# Patient Record
Sex: Female | Born: 1945
Health system: Southern US, Community
[De-identification: ages and names within clinical notes are randomized; demographics above are authoritative.]

## PROBLEM LIST (undated history)

## (undated) DIAGNOSIS — E785 Hyperlipidemia, unspecified: Secondary | ICD-10-CM

## (undated) DIAGNOSIS — I10 Essential (primary) hypertension: Secondary | ICD-10-CM

## (undated) DIAGNOSIS — F419 Anxiety disorder, unspecified: Secondary | ICD-10-CM

## (undated) DIAGNOSIS — K219 Gastro-esophageal reflux disease without esophagitis: Secondary | ICD-10-CM

## (undated) DIAGNOSIS — Z87891 Personal history of nicotine dependence: Secondary | ICD-10-CM

## (undated) DIAGNOSIS — M1990 Unspecified osteoarthritis, unspecified site: Secondary | ICD-10-CM

## (undated) DIAGNOSIS — IMO0001 Reserved for inherently not codable concepts without codable children: Secondary | ICD-10-CM

## (undated) DIAGNOSIS — K5190 Ulcerative colitis, unspecified, without complications: Secondary | ICD-10-CM

## (undated) DIAGNOSIS — Z8719 Personal history of other diseases of the digestive system: Secondary | ICD-10-CM

## (undated) DIAGNOSIS — H269 Unspecified cataract: Secondary | ICD-10-CM

## (undated) DIAGNOSIS — R011 Cardiac murmur, unspecified: Secondary | ICD-10-CM

## (undated) HISTORY — DX: Unspecified cataract: H26.9

## (undated) HISTORY — DX: Ulcerative colitis, unspecified, without complications: K51.90

## (undated) HISTORY — PX: COLONOSCOPY: SHX174

## (undated) HISTORY — DX: Unspecified osteoarthritis, unspecified site: M19.90

## (undated) HISTORY — DX: Essential (primary) hypertension: I10

## (undated) HISTORY — DX: Personal history of other diseases of the digestive system: Z87.19

## (undated) HISTORY — DX: Cardiac murmur, unspecified: R01.1

## (undated) HISTORY — DX: Hyperlipidemia, unspecified: E78.5

## (undated) HISTORY — DX: Personal history of nicotine dependence: Z87.891

## (undated) HISTORY — DX: Anxiety disorder, unspecified: F41.9

## (undated) HISTORY — DX: Gastro-esophageal reflux disease without esophagitis: K21.9

## (undated) HISTORY — PX: UPPER GASTROINTESTINAL ENDOSCOPY: SHX188

## (undated) HISTORY — DX: Reserved for inherently not codable concepts without codable children: IMO0001

## (undated) HISTORY — PX: KNEE SURGERY: SHX244

---

## 1984-09-15 HISTORY — PX: BREAST BIOPSY: SHX20

## 1998-08-29 ENCOUNTER — Encounter: Payer: Self-pay | Admitting: Obstetrics and Gynecology

## 1998-08-29 ENCOUNTER — Ambulatory Visit: Admission: RE | Admit: 1998-08-29 | Discharge: 1998-08-29 | Payer: Self-pay | Admitting: Obstetrics and Gynecology

## 1998-09-20 ENCOUNTER — Ambulatory Visit (HOSPITAL_COMMUNITY): Admission: RE | Admit: 1998-09-20 | Discharge: 1998-09-20 | Payer: Self-pay | Admitting: Internal Medicine

## 1998-12-31 ENCOUNTER — Other Ambulatory Visit: Admission: RE | Admit: 1998-12-31 | Discharge: 1998-12-31 | Payer: Self-pay | Admitting: Obstetrics and Gynecology

## 1999-09-20 ENCOUNTER — Ambulatory Visit (HOSPITAL_COMMUNITY): Admission: RE | Admit: 1999-09-20 | Discharge: 1999-09-20 | Payer: Self-pay | Admitting: Obstetrics and Gynecology

## 1999-09-20 ENCOUNTER — Encounter: Payer: Self-pay | Admitting: Obstetrics and Gynecology

## 1999-11-07 ENCOUNTER — Encounter (INDEPENDENT_AMBULATORY_CARE_PROVIDER_SITE_OTHER): Payer: Self-pay | Admitting: Specialist

## 1999-11-07 ENCOUNTER — Other Ambulatory Visit: Admission: RE | Admit: 1999-11-07 | Discharge: 1999-11-07 | Payer: Self-pay | Admitting: Internal Medicine

## 2000-01-21 ENCOUNTER — Other Ambulatory Visit: Admission: RE | Admit: 2000-01-21 | Discharge: 2000-01-21 | Payer: Self-pay | Admitting: Obstetrics and Gynecology

## 2000-09-21 ENCOUNTER — Ambulatory Visit (HOSPITAL_COMMUNITY): Admission: RE | Admit: 2000-09-21 | Discharge: 2000-09-21 | Payer: Self-pay | Admitting: Obstetrics and Gynecology

## 2000-09-21 ENCOUNTER — Encounter: Payer: Self-pay | Admitting: Obstetrics and Gynecology

## 2000-09-22 ENCOUNTER — Other Ambulatory Visit: Admission: RE | Admit: 2000-09-22 | Discharge: 2000-09-22 | Payer: Self-pay | Admitting: Obstetrics and Gynecology

## 2003-09-16 DIAGNOSIS — Z8719 Personal history of other diseases of the digestive system: Secondary | ICD-10-CM

## 2003-09-16 HISTORY — DX: Personal history of other diseases of the digestive system: Z87.19

## 2006-04-30 ENCOUNTER — Ambulatory Visit: Payer: Self-pay | Admitting: Internal Medicine

## 2006-09-15 DIAGNOSIS — IMO0001 Reserved for inherently not codable concepts without codable children: Secondary | ICD-10-CM

## 2006-09-15 HISTORY — DX: Reserved for inherently not codable concepts without codable children: IMO0001

## 2006-10-19 ENCOUNTER — Ambulatory Visit: Payer: Self-pay | Admitting: Internal Medicine

## 2006-12-01 ENCOUNTER — Ambulatory Visit: Payer: Self-pay | Admitting: Internal Medicine

## 2006-12-01 LAB — CONVERTED CEMR LAB
ALT: 17 units/L (ref 0–40)
AST: 28 units/L (ref 0–37)
Albumin: 3.8 g/dL (ref 3.5–5.2)
Alkaline Phosphatase: 33 units/L — ABNORMAL LOW (ref 39–117)
BUN: 15 mg/dL (ref 6–23)
Basophils Absolute: 0 10*3/uL (ref 0.0–0.1)
Basophils Relative: 0.7 % (ref 0.0–1.0)
Bilirubin, Direct: 0.1 mg/dL (ref 0.0–0.3)
CO2: 31 meq/L (ref 19–32)
Calcium: 9.3 mg/dL (ref 8.4–10.5)
Chloride: 106 meq/L (ref 96–112)
Cholesterol: 196 mg/dL (ref 0–200)
Creatinine, Ser: 0.8 mg/dL (ref 0.4–1.2)
Eosinophils Absolute: 0.6 10*3/uL (ref 0.0–0.6)
Eosinophils Relative: 8.8 % — ABNORMAL HIGH (ref 0.0–5.0)
GFR calc Af Amer: 94 mL/min
GFR calc non Af Amer: 78 mL/min
Glucose, Bld: 96 mg/dL (ref 70–99)
HCT: 37.1 % (ref 36.0–46.0)
HDL: 54.1 mg/dL (ref >39.0)
Hemoglobin: 13.3 g/dL (ref 12.0–15.0)
LDL Cholesterol: 125 mg/dL — ABNORMAL HIGH (ref 0–99)
Lymphocytes Relative: 31.5 % (ref 12.0–46.0)
MCHC: 36 g/dL (ref 30.0–36.0)
MCV: 91.9 fL (ref 78.0–100.0)
Monocytes Absolute: 0.5 10*3/uL (ref 0.2–0.7)
Monocytes Relative: 7.8 % (ref 3.0–11.0)
Neutro Abs: 3.4 10*3/uL (ref 1.4–7.7)
Neutrophils Relative %: 51.2 % (ref 43.0–77.0)
Platelets: 269 10*3/uL (ref 150–400)
Potassium: 4.8 meq/L (ref 3.5–5.1)
RBC: 4.03 M/uL (ref 3.87–5.11)
RDW: 13 % (ref 11.5–14.6)
Sodium: 141 meq/L (ref 135–145)
TSH: 1.51 microintl units/mL (ref 0.35–5.50)
Total Bilirubin: 0.9 mg/dL (ref 0.3–1.2)
Total CHOL/HDL Ratio: 3.6
Total Protein: 6.6 g/dL (ref 6.0–8.3)
Triglycerides: 84 mg/dL (ref 0–149)
VLDL: 17 mg/dL (ref 0–40)
WBC: 6.6 10*3/uL (ref 4.5–10.5)

## 2006-12-28 ENCOUNTER — Ambulatory Visit: Payer: Self-pay | Admitting: Internal Medicine

## 2007-01-14 ENCOUNTER — Encounter: Payer: Self-pay | Admitting: Internal Medicine

## 2007-01-14 ENCOUNTER — Ambulatory Visit: Payer: Self-pay

## 2007-03-15 ENCOUNTER — Ambulatory Visit: Payer: Self-pay | Admitting: Internal Medicine

## 2007-04-15 DIAGNOSIS — E785 Hyperlipidemia, unspecified: Secondary | ICD-10-CM

## 2007-04-15 DIAGNOSIS — F411 Generalized anxiety disorder: Secondary | ICD-10-CM | POA: Insufficient documentation

## 2007-04-15 DIAGNOSIS — I10 Essential (primary) hypertension: Secondary | ICD-10-CM | POA: Insufficient documentation

## 2007-04-15 DIAGNOSIS — F329 Major depressive disorder, single episode, unspecified: Secondary | ICD-10-CM

## 2007-04-20 ENCOUNTER — Telehealth: Payer: Self-pay | Admitting: Internal Medicine

## 2007-05-24 ENCOUNTER — Telehealth: Payer: Self-pay | Admitting: Internal Medicine

## 2007-08-04 ENCOUNTER — Ambulatory Visit: Payer: Self-pay | Admitting: Internal Medicine

## 2007-10-07 ENCOUNTER — Encounter: Payer: Self-pay | Admitting: Internal Medicine

## 2007-12-02 ENCOUNTER — Telehealth: Payer: Self-pay | Admitting: Internal Medicine

## 2007-12-09 ENCOUNTER — Ambulatory Visit: Payer: Self-pay | Admitting: Internal Medicine

## 2007-12-09 LAB — CONVERTED CEMR LAB
ALT: 22 units/L (ref 0–35)
AST: 23 units/L (ref 0–37)
Albumin: 3.4 g/dL — ABNORMAL LOW (ref 3.5–5.2)
Alkaline Phosphatase: 46 units/L (ref 39–117)
BUN: 18 mg/dL (ref 6–23)
Basophils Absolute: 0.1 10*3/uL (ref 0.0–0.1)
Basophils Relative: 0.8 % (ref 0.0–1.0)
Bilirubin Urine: NEGATIVE
Bilirubin, Direct: 0.1 mg/dL (ref 0.0–0.3)
CO2: 30 meq/L (ref 19–32)
Calcium: 9.2 mg/dL (ref 8.4–10.5)
Chloride: 102 meq/L (ref 96–112)
Cholesterol: 211 mg/dL (ref 0–200)
Creatinine, Ser: 0.6 mg/dL (ref 0.4–1.2)
Direct LDL: 151.8 mg/dL
Eosinophils Absolute: 0.6 10*3/uL (ref 0.0–0.6)
Eosinophils Relative: 7.3 % — ABNORMAL HIGH (ref 0.0–5.0)
GFR calc Af Amer: 131 mL/min
GFR calc non Af Amer: 108 mL/min
Glucose, Bld: 84 mg/dL (ref 70–99)
Glucose, Urine, Semiquant: NEGATIVE
HCT: 41.2 % (ref 36.0–46.0)
HDL: 50.4 mg/dL (ref >39.0)
Hemoglobin: 13.6 g/dL (ref 12.0–15.0)
Ketones, urine, test strip: NEGATIVE
Lymphocytes Relative: 26.2 % (ref 12.0–46.0)
MCHC: 32.9 g/dL (ref 30.0–36.0)
MCV: 96 fL (ref 78.0–100.0)
Monocytes Absolute: 0.7 10*3/uL (ref 0.2–0.7)
Monocytes Relative: 8.2 % (ref 3.0–11.0)
Neutro Abs: 4.9 10*3/uL (ref 1.4–7.7)
Neutrophils Relative %: 57.5 % (ref 43.0–77.0)
Nitrite: NEGATIVE
Platelets: 247 10*3/uL (ref 150–400)
Potassium: 3.9 meq/L (ref 3.5–5.1)
Protein, U semiquant: NEGATIVE
RBC: 4.29 M/uL (ref 3.87–5.11)
RDW: 13.4 % (ref 11.5–14.6)
Sodium: 138 meq/L (ref 135–145)
Specific Gravity, Urine: 1.025
TSH: 1.04 microintl units/mL (ref 0.35–5.50)
Total Bilirubin: 0.8 mg/dL (ref 0.3–1.2)
Total CHOL/HDL Ratio: 4.2
Total Protein: 6.6 g/dL (ref 6.0–8.3)
Triglycerides: 66 mg/dL (ref 0–149)
Urobilinogen, UA: 0.2
VLDL: 13 mg/dL (ref 0–40)
WBC Urine, dipstick: NEGATIVE
WBC: 8.6 10*3/uL (ref 4.5–10.5)
pH: 5.5

## 2008-04-17 ENCOUNTER — Other Ambulatory Visit: Admission: RE | Admit: 2008-04-17 | Discharge: 2008-04-17 | Payer: Self-pay | Admitting: Internal Medicine

## 2008-04-17 ENCOUNTER — Encounter: Payer: Self-pay | Admitting: Internal Medicine

## 2008-04-17 ENCOUNTER — Ambulatory Visit: Payer: Self-pay | Admitting: Internal Medicine

## 2008-04-17 DIAGNOSIS — Z8719 Personal history of other diseases of the digestive system: Secondary | ICD-10-CM

## 2008-08-16 ENCOUNTER — Telehealth: Payer: Self-pay | Admitting: Internal Medicine

## 2008-10-03 ENCOUNTER — Telehealth (INDEPENDENT_AMBULATORY_CARE_PROVIDER_SITE_OTHER): Payer: Self-pay | Admitting: *Deleted

## 2008-11-15 ENCOUNTER — Encounter: Payer: Self-pay | Admitting: Internal Medicine

## 2008-11-22 ENCOUNTER — Encounter: Payer: Self-pay | Admitting: *Deleted

## 2009-03-08 ENCOUNTER — Encounter (INDEPENDENT_AMBULATORY_CARE_PROVIDER_SITE_OTHER): Payer: Self-pay | Admitting: *Deleted

## 2009-07-02 ENCOUNTER — Ambulatory Visit: Payer: Self-pay | Admitting: Internal Medicine

## 2009-07-02 DIAGNOSIS — F17200 Nicotine dependence, unspecified, uncomplicated: Secondary | ICD-10-CM | POA: Insufficient documentation

## 2009-07-02 DIAGNOSIS — R071 Chest pain on breathing: Secondary | ICD-10-CM | POA: Insufficient documentation

## 2009-07-04 LAB — CONVERTED CEMR LAB
ALT: 21 units/L (ref 0–35)
AST: 25 units/L (ref 0–37)
Albumin: 4.4 g/dL (ref 3.5–5.2)
Alkaline Phosphatase: 49 units/L (ref 39–117)
BUN: 15 mg/dL (ref 6–23)
Basophils Absolute: 0.1 10*3/uL (ref 0.0–0.1)
Basophils Relative: 0.9 % (ref 0.0–3.0)
Bilirubin, Direct: 0 mg/dL (ref 0.0–0.3)
CO2: 31 meq/L (ref 19–32)
Calcium: 9.7 mg/dL (ref 8.4–10.5)
Chloride: 101 meq/L (ref 96–112)
Cholesterol: 204 mg/dL — ABNORMAL HIGH (ref 0–200)
Creatinine, Ser: 0.8 mg/dL (ref 0.4–1.2)
Direct LDL: 138.2 mg/dL
Eosinophils Absolute: 0.7 10*3/uL (ref 0.0–0.7)
Eosinophils Relative: 5.8 % — ABNORMAL HIGH (ref 0.0–5.0)
GFR calc non Af Amer: 77.31 mL/min (ref >60)
Glucose, Bld: 76 mg/dL (ref 70–99)
HCT: 43.9 % (ref 36.0–46.0)
HDL: 51.6 mg/dL (ref >39.00)
Hemoglobin: 14.9 g/dL (ref 12.0–15.0)
Lymphocytes Relative: 30.6 % (ref 12.0–46.0)
Lymphs Abs: 3.5 10*3/uL (ref 0.7–4.0)
MCHC: 33.8 g/dL (ref 30.0–36.0)
MCV: 97.7 fL (ref 78.0–100.0)
Monocytes Absolute: 0.9 10*3/uL (ref 0.1–1.0)
Monocytes Relative: 8.1 % (ref 3.0–12.0)
Neutro Abs: 6.4 10*3/uL (ref 1.4–7.7)
Neutrophils Relative %: 54.6 % (ref 43.0–77.0)
Platelets: 252 10*3/uL (ref 150.0–400.0)
Potassium: 5.1 meq/L (ref 3.5–5.1)
RBC: 4.5 M/uL (ref 3.87–5.11)
RDW: 13 % (ref 11.5–14.6)
Sodium: 141 meq/L (ref 135–145)
TSH: 1.12 microintl units/mL (ref 0.35–5.50)
Total Bilirubin: 1 mg/dL (ref 0.3–1.2)
Total CHOL/HDL Ratio: 4
Total Protein: 7.4 g/dL (ref 6.0–8.3)
Triglycerides: 108 mg/dL (ref 0.0–149.0)
VLDL: 21.6 mg/dL (ref 0.0–40.0)
WBC: 11.6 10*3/uL — ABNORMAL HIGH (ref 4.5–10.5)

## 2009-09-17 ENCOUNTER — Encounter (INDEPENDENT_AMBULATORY_CARE_PROVIDER_SITE_OTHER): Payer: Self-pay

## 2009-09-18 ENCOUNTER — Ambulatory Visit: Payer: Self-pay | Admitting: Internal Medicine

## 2009-09-21 ENCOUNTER — Ambulatory Visit: Payer: Self-pay | Admitting: Internal Medicine

## 2009-09-25 ENCOUNTER — Encounter: Payer: Self-pay | Admitting: Internal Medicine

## 2009-10-23 ENCOUNTER — Ambulatory Visit: Payer: Self-pay | Admitting: Internal Medicine

## 2009-10-23 DIAGNOSIS — J029 Acute pharyngitis, unspecified: Secondary | ICD-10-CM

## 2009-10-23 DIAGNOSIS — J069 Acute upper respiratory infection, unspecified: Secondary | ICD-10-CM | POA: Insufficient documentation

## 2009-10-23 LAB — CONVERTED CEMR LAB: Rapid Strep: NEGATIVE

## 2010-03-20 ENCOUNTER — Encounter: Payer: Self-pay | Admitting: Internal Medicine

## 2010-06-22 ENCOUNTER — Emergency Department (HOSPITAL_COMMUNITY): Admission: EM | Admit: 2010-06-22 | Discharge: 2010-06-22 | Payer: Self-pay | Admitting: Emergency Medicine

## 2010-10-17 NOTE — Miscellaneous (Signed)
Summary: Lec previsit  Clinical Lists Changes  Medications: Added new medication of MIRALAX   POWD (POLYETHYLENE GLYCOL 3350) As per prep  instructions. - Signed Added new medication of REGLAN 10 MG  TABS (METOCLOPRAMIDE HCL) As per prep instructions. - Signed Added new medication of DULCOLAX 5 MG  TBEC (BISACODYL) Day before procedure take 2 at 3pm and 2 at 8pm. - Signed Rx of MIRALAX   POWD (POLYETHYLENE GLYCOL 3350) As per prep  instructions.;  #255gm x 0;  Signed;  Entered by: Cornelia Copa RN;  Authorized by: Lafayette Dragon MD;  Method used: Electronically to [facility]  [phone]*, [address] [place], [place]  [address], Ph: [phone] or [phone], Fax: [phone] Rx of REGLAN 10 MG  TABS (METOCLOPRAMIDE HCL) As per prep instructions.;  #2 x 0;  Signed;  Entered by: Cornelia Copa RN;  Authorized by: Lafayette Dragon MD;  Method used: Electronically to [facility]  [phone]*, [address], [place], [place]  [address], Ph: [phone] or [phone], Fax: [phone] Rx of DULCOLAX 5 MG  TBEC (BISACODYL) Day before procedure take 2 at 3pm and 2 at 8pm.;  #4 x 0;  Signed;  Entered by: Cornelia Copa RN;  Authorized by: Lafayette Dragon MD;  Method used: Electronically to [facility]  [phone]*, [address], [place], [place]  [address], Ph: [phone] or [phone], Fax: [phone] Observations: Added new observation of NKA: T (09/18/2009 14:38)    Prescriptions: DULCOLAX 5 MG  TBEC (BISACODYL) Day before procedure take 2 at 3pm and 2 at 8pm.  #4 x 0   Entered by:   Cornelia Copa RN   Authorized by:   Lafayette Dragon MD   Signed by:   Cornelia Copa RN on 09/18/2009   Method used:   Electronically to        [facility]  [phone]* (retail)       [place], [place]  [address]       Ph: [phone] or [phone]       Fax: [phone]   RxID:   [id number] REGLAN 10 MG  TABS (METOCLOPRAMIDE HCL) As per prep instructions.  #2 x 0   Entered by:    Cornelia Copa RN   Authorized by:   Lafayette Dragon MD   Signed by:   Cornelia Copa RN on 09/18/2009   Method used:   Electronically to        [facility]  [phone]* (retail)       [place], [place]  [address]       Ph: [phone] or [phone]       Fax: [phone]   RxID:   [id number] [place]   POWD (POLYETHYLENE GLYCOL 3350) As per prep  instructions.  #255gm x 0   Entered by:   Cornelia Copa RN   Authorized by:   Lafayette Dragon MD   Signed by:   Cornelia Copa RN on 09/18/2009   Method used:   Electronically to        [facility]  [phone]* (retail)       [place], [place]  [address]       Ph: [phone] or [phone]       Fax: [phone]   RxID:   [id number]

## 2010-10-17 NOTE — Letter (Signed)
Summary: Patient Notice- Colon Biospy Results  [facility]  [address] [place], [place] [address]   Phone: [phone]  Fax: [phone]        September 25, 2009 MRN: [id number]    [facility] [place],   [address]    Dear Ms. Bangert,  I am pleased to inform you that the biopsies taken during your recent colonoscopy did not show any evidence of cancer upon pathologic examination.The small polyps were inflammatory polyps from a prior flare up of  Your colitis. Your colitis is not active now.  Additional information/recommendations:  __No further action is needed at this time.  Please follow-up with      your primary care physician for your other healthcare needs.  __Please call [phone] to schedule a return visit to review      your condition.  _x_Continue with the treatment plan as outlined on the day of your      exam.  __You should have a repeat colonoscopy examination for this problem           in _ years.  Please call us if you are having persistent problems or have questions about your condition that have not been fully answered at this time.  Sincerely,  Lafayette Dragon MD   This letter has been electronically signed by your physician.  Appended Document: Patient Notice- Colon Biospy Results Letter mailed 01.12.11

## 2010-10-17 NOTE — Letter (Signed)
Summary: [facility] Instructions  [facility]  [place], [place] [address]   Phone: [phone]  Fax: [phone]       Shelley Wright    06-05-1946    MRN: [id number]       Procedure Day /Date: Friday 09/21/09     Arrival Time: 10:30am     Procedure Time: 11:30 am    Location of Procedure:                    _x _  [facility] (4th Floor)    [place]  Starting 5 days prior to your procedure 09/16/09 do not eat nuts, seeds, popcorn, corn, beans, peas,  salads, or any raw vegetables.  Do not take any fiber supplements (e.g. Metamucil, Citrucel, and Benefiber). ____________________________________________________________________________________________________   THE DAY BEFORE YOUR PROCEDURE         DATE:09/20/09 OMA:YOKHTXHF  1   Drink clear liquids the entire day-NO SOLID FOOD  2   Do not drink anything colored red or purple.  Avoid juices with pulp.  No orange juice.  3   Drink at least 64 oz. (8 glasses) of fluid/clear liquids during the day to prevent dehydration and help the prep work efficiently.  CLEAR LIQUIDS INCLUDE: Water Jello Ice Popsicles Tea (sugar ok, no milk/cream) Powdered fruit flavored drinks Coffee (sugar ok, no milk/cream) Gatorade Juice: apple, white grape, white cranberry  Lemonade Clear bullion, consomm, broth Carbonated beverages (any kind) Strained chicken noodle soup Hard Candy  4   Mix the entire bottle of Miralax with 64 oz. of Gatorade/Powerade in the morning and put in the refrigerator to chill.  5   At 3:00 pm take 2 Dulcolax/Bisacodyl tablets.  6   At 4:30 pm take one Reglan/Metoclopramide tablet.  7  Starting at 5:00 pm drink one 8 oz glass of the Miralax mixture every 15-20 minutes until you have finished drinking the entire 64 oz.  You should finish drinking prep around 7:30 or 8:00 pm.  8   If you are nauseated, you may take the 2nd Reglan/Metoclopramide tablet at  6:30 pm.        9    At 8:00 pm take 2 more DULCOLAX/Bisacodyl tablets.     THE DAY OF YOUR PROCEDURE      DATE: 09/21/09 SFS:ELTRVU  You may drink clear liquids until 9:30 am (2 HOURS BEFORE PROCEDURE).   MEDICATION INSTRUCTIONS  Unless otherwise instructed, you should take regular prescription medications with a small sip of water as early as possible the morning of your procedure.   Additional medication instructions: _  _Do not take fluid pill am of procedure.         OTHER INSTRUCTIONS  You will need a responsible adult at least 63 years of age to accompany you and drive you home.   This person must remain in the waiting room during your procedure.  Wear loose fitting clothing that is easily removed.  Leave jewelry and other valuables at home.  However, you may wish to bring a book to read or an iPod/MP3 player to listen to music as you wait for your procedure to start.  Remove all body piercing jewelry and leave at home.  Total time from sign-in until discharge is approximately 2-3 hours.  You should go home directly after your procedure and rest.  You can resume normal activities the day after your procedure.  The day of your procedure you  should not:   Drive   Make legal decisions   Operate machinery   Drink alcohol   Return to work  You will receive specific instructions about eating, activities and medications before you leave.   The above instructions have been reviewed and explained to me by  Cornelia Copa RN  September 18, 2009 3:06 PM     I fully understand and can verbalize these instructions _____________________________ Date _______

## 2010-10-17 NOTE — Assessment & Plan Note (Signed)
Summary: ST/NJR   Vital Signs:  Patient profile:   65 year old female Menstrual status:  postmenopausal Weight:      146 pounds Temp:     98.5 degrees F oral Pulse rate:   68 / minute BP sitting:   100 / 70  (right arm) Cuff size:   regular  Vitals Entered By: Sherron Monday, CMA (AAMA) (October 23, 2009 2:59 PM) CC: Sore throat, nasal congestion, ha, no coughing or fever. Exposed to strep     Menstrual Status postmenopausal   History of Present Illness: Shelley Wright comes in today for    SDA  above .   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 No discrete exposures.   Onset  4 days ago of st and fever chills and then   congestion and face pressure. taking otc warm liquids and   sudafed.     Preventive Screening-Counseling & Management  Alcohol-Tobacco     Alcohol drinks/day: 0     Smoking Status: quit     Packs/Day: 0.25     Year Quit: 2010  Caffeine-Diet-Exercise     Caffeine use/day: 2     Does Patient Exercise: yes  Current Medications (verified): 1)  Lisinopril-Hydrochlorothiazide 20-12.5 Mg Tabs (Lisinopril-Hydrochlorothiazide) .Marland Kitchen.. 1 By Mouth Once Daily 2)  Lexapro 10 Mg  Tabs (Escitalopram Oxalate) .... Take 1/2 To 1 By Mouth Once Daily. 3)  Crestor 10 Mg  Tabs (Rosuvastatin Calcium) .Marland Kitchen.. 1 By Mouth Once Daily 4)  Chantix Continuing Month Pak 1 Mg Tabs (Varenicline Tartrate) .... Take As Directed 5)  Azulfidine En-Tabs 500 Mg  Tbec (Sulfasalazine) .... Take 2 By Mouth Two Times A Day 6)  Folic Acid 1 Mg Tabs (Folic Acid) .Marland Kitchen.. 1 By Mouth Daily  Allergies (verified): No Known Drug Allergies  Past History:  Past medical, surgical, family and social histories (including risk factors) reviewed for relevance to current acute and chronic problems.  Past Medical History: Reviewed history from 04/17/2008 and no changes required. Hyperlipidemia Hypertension Anxiety Depression Ulcerative colitis pan colitis 2005 g4p4 Myoview  2008 neg    Past Surgical  History: Reviewed history from 04/17/2008 and no changes required. breast bx 1986  Past History:  Care Management: Gastroenterology:[facility]; [facility]   Family History: Reviewed history from 04/17/2008 and no changes required. breast cancer MOM age 70 mom  angina   3 sibs   one DM early   father died of panceatic cancer   10 years ago.   Family History Hypertension every one  No colon cancer    Social History: Reviewed history from 07/02/2009 and no changes required. Former Smoker still struggling but ok Alcohol use-yes Drug use-no Regular exercise-no Occupation: Pharmacist, hospital HHof 2  pet dog  Caffeine use/day:  2 Does Patient Exercise:  yes  Review of Systems  The patient denies weight gain, vision loss, decreased hearing, chest pain, peripheral edema, prolonged cough, headaches, hemoptysis, and abdominal pain.    Physical Exam  General:  Well-developed,well-nourished,in no acute distress; alert,appropriate and cooperative throughout examination Head:  Normocephalic and atraumatic without obvious abnormalities. No apparent alopecia or balding. Eyes:  clear   no discharge  Ears:  R ear normal, L ear normal, and no external deformities.   Nose:  no external deformity and no external erythema.  very congested face minimally tender  Mouth:  uvula mildy red no lesions nor ulcer s Neck:  No deformities, masses, or tenderness noted. shoddy ac nodes  Lungs:  normal respiratory effort, no intercostal retractions, and no accessory muscle use.   Heart:  normal rate, regular rhythm, and no murmur.   Pulses:  nl cap refill  Neurologic:  non focal  Skin:  turgor normal, color normal, no ecchymoses, and no petechiae.   Cervical Nodes:  shoddy nodes  Psych:  Oriented X3, good eye contact, not anxious appearing, and not depressed appearing.     Impression & Recommendations:  Problem # 1:  SORE THROAT (ICD-462) appears to be viral   by hx and context .  Expectant management     to check for alarm signs    Orders: Rapid Strep (79728)  Problem # 2:  URI (ASU-015.9) viral  signs treatment.   Complete Medication List: 1)  Lisinopril-hydrochlorothiazide 20-12.5 Mg Tabs (Lisinopril-hydrochlorothiazide) .Marland Kitchen.. 1 by mouth once daily 2)  Lexapro 10 Mg Tabs (Escitalopram oxalate) .... Take 1/2 to 1 by mouth once daily. 3)  Crestor 10 Mg Tabs (Rosuvastatin calcium) .Marland Kitchen.. 1 by mouth once daily 4)  Chantix Continuing Month Pak 1 Mg Tabs (Varenicline tartrate) .... Take as directed 5)  Azulfidine En-tabs 500 Mg Tbec (Sulfasalazine) .... Take 2 by mouth two times a day 6)  Folic Acid 1 Mg Tabs (Folic acid) .Marland Kitchen.. 1 by mouth daily  Patient Instructions: 1)  Get plenty of rest, drink lots of clear liquids, and use Tylenol or Ibuprofen for fever and comfort. Return in 7-10 days if you're not better: sooner if you'er feeling worse.  2)  Acute sinusitis symptoms for less than 10 days are not helped by antibiotics. Use warm moist compresses, and over the counter decongestants( only as directed). Call if no improvement in 5-7 days, sooner if increasing pain, fever, or new symptoms.   Laboratory Results    Other Tests  Rapid Strep: negative Comments: Joyce Gross  October 23, 2009 3:29 PM   Kit Test Internal QC: Negative   (Normal Range: Negative)

## 2010-10-17 NOTE — Miscellaneous (Signed)
Summary: GI MED  Clinical Lists Changes  Medications: Added new medication of AZULFIDINE EN-TABS 500 MG  TBEC (SULFASALAZINE) take 2 by mouth two times a day - Signed Added new medication of FOLIC ACID 1 MG TABS (FOLIC ACID) 1 by mouth daily - Signed Rx of AZULFIDINE EN-TABS 500 MG  TBEC (SULFASALAZINE) take 2 by mouth two times a day;  #120 x 6;  Signed;  Entered by: Trilby Leaver RN;  Authorized by: Lafayette Dragon MD;  Method used: Electronically to [facility]  [phone]*, [address] [place], [place]  [address], Ph: [phone] or [phone], Fax: [phone] Rx of FOLIC ACID 1 MG TABS (FOLIC ACID) 1 by mouth daily;  #100 x 3;  Signed;  Entered by: Trilby Leaver RN;  Authorized by: Lafayette Dragon MD;  Method used: Electronically to [facility]  [phone]*, [address], [place], [place]  [address], Ph: [phone] or [phone], Fax: [phone] Observations: Added new observation of ALLERGY REV: Done (09/21/2009 12:53) Added new observation of NKA: T (09/21/2009 12:53)    Prescriptions: FOLIC ACID 1 MG TABS (FOLIC ACID) 1 by mouth daily  #100 x 3   Entered by:   Trilby Leaver RN   Authorized by:   Lafayette Dragon MD   Signed by:   Trilby Leaver RN on 09/21/2009   Method used:   Electronically to        [facility]  [phone]* (retail)       [place], [place]  [address]       Ph: [phone] or [phone]       Fax: [phone]   RxID:   [id number] AZULFIDINE EN-TABS 500 MG  TBEC (SULFASALAZINE) take 2 by mouth two times a day  #120 x 6   Entered by:   Trilby Leaver RN   Authorized by:   Lafayette Dragon MD   Signed by:   Trilby Leaver RN on 09/21/2009   Method used:   Electronically to        [facility]  [phone]* (retail)       [address] [place], [place]  [address]       Ph: [phone] or [phone]       Fax: [phone]   RxID:   [id number]

## 2010-10-17 NOTE — Procedures (Signed)
Summary: Colonoscopy  Patient: [name] Note: All result statuses are Final unless otherwise noted.  Tests: (1) Colonoscopy (COL)   COL Colonoscopy           [facility].     [place], [place]  [address]           COLONOSCOPY PROCEDURE REPORT           PATIENT:  Shelley, Wright  MR#:  [id number]     BIRTHDATE:  1945/12/09, 63 yrs. old  GENDER:  female           ENDOSCOPIST:  Lowella Bandy. Olevia Perches, MD     Referred by:  Standley Brooking. Panosh, M.D.           PROCEDURE DATE:  09/21/2009     PROCEDURE:  Colonoscopy 09983     ASA CLASS:  Class I     INDICATIONS:  Ulcerative colitis on colon 2001, flare up in     [place] 2007-had pancolitis, now in remissiopn on no medications (     Asacal "too expensive")           MEDICATIONS:   Versed 10 mg, Fentanyl 75 mcg           DESCRIPTION OF PROCEDURE:   After the risks benefits and     alternatives of the procedure were thoroughly explained, informed     consent was obtained.  Digital rectal exam was performed and     revealed no rectal masses.   The LB [id number] endoscope     was introduced through the anus and advanced to the terminal ileum     which was intubated for a short distance, without limitations.     The quality of the prep was good, using MiraLax.  The instrument     was then slowly withdrawn as the colon was fully examined.     <<PROCEDUREIMAGES>>           FINDINGS:  pseudopolyps throughout the colon. multiple 5-10 mm     pseudopolyps, no evidence os acute colitis Multiple biopsies were     obtained and sent to pathology (see image2, image3, image6,     image11, and image12).  Mild diverticulosis was found (see     image1).  The terminal ileum appeared normal. normal appering TI     With standard forceps, biopsy was obtained and sent to pathology     (see image7, image9, and image8).  This was otherwise a normal     examination of the colon (see image13, image11, image10, and     image6).    Retroflexed views in the rectum revealed no     abnormalities.    The scope was then withdrawn from the patient     and the procedure completed.           COMPLICATIONS:  None           ENDOSCOPIC IMPRESSION:     1) Pseudopolyps throughout the colon     2) Mild diverticulosis     3) Normal terminal ileum     4) Otherwise normal examination     no evidence of active IBD     RECOMMENDATIONS:     Azulfidine 551m, #120, 2 po bid, 6 refills, Folic acid 179m     #1#3821 po qd           REPEAT EXAM:  In  5 year(s) for.           ______________________________     Lowella Bandy. Olevia Perches, MD           CC:           n.     eSIGNED:   Lowella Bandy. Tsering Leaman at 09/21/2009 12:36 PM           Page 2 of 3   Shelley, Wright, [id number]  Note: An exclamation mark (!) indicates a result that was not dispersed into the flowsheet. Document Creation Date: 09/21/2009 12:36 PM _______________________________________________________________________  (1) Order result status: Final Collection or observation date-time: 09/21/2009 12:22 Requested date-time:  Receipt date-time:  Reported date-time:  Referring Physician:   Ordering Physician: Delfin Edis [phone]) Specimen Source:  Source: Tawanna Cooler Order Number: [id number] Lab site:   Appended Document: Colonoscopy     Procedures Next Due Date:    Colonoscopy: 09/2014

## 2010-11-17 ENCOUNTER — Other Ambulatory Visit: Payer: Self-pay | Admitting: Internal Medicine

## 2010-11-18 NOTE — Telephone Encounter (Signed)
Mailed letter to pt saying that she needs a follow up appt before next refill.

## 2010-12-23 ENCOUNTER — Other Ambulatory Visit: Payer: Self-pay | Admitting: Internal Medicine

## 2010-12-23 MED ORDER — LISINOPRIL-HYDROCHLOROTHIAZIDE 20-12.5 MG PO TABS: 1.0000 | ORAL_TABLET | Freq: Every day | ORAL | Status: DC

## 2011-01-27 ENCOUNTER — Other Ambulatory Visit: Payer: Self-pay | Admitting: Internal Medicine

## 2011-01-31 NOTE — Assessment & Plan Note (Signed)
[facility] OFFICE NOTE   Shelley Wright, Shelley Wright                       MRN:          [id number]  DATE:10/19/2006                            DOB:          Feb 17, 1946    CHIEF COMPLAINT:  New patient to get established.  Question change to  generic meds.   HISTORY OF PRESENT ILLNESS:  Shelley Wright is a 65 year old ex-smoking  times 1-1/2 years, white female who comes in today for a first time  visit.  Her previous care was in [place] and before that in [place].  She is trying to establish with a primary care physician.  She sees Dr. Olevia Wright for gastroenterologic needs.  She is generally well  today, at least concerned about weight gain over the last number of  years, at least 10 pounds, possibly associated with change of jobs and  going back to teaching preschoolers. She was put on Lexapro over the  last 6 to 8 months for some mild mood issues, which the medicine has  significantly helped.  She would like to remain on this.  Because her  husband lost his job, she is well aware of financial constraints and  wondered if some of her medicines can be changed to generic or  equivalent.  She currently has hypertension on medication  Benicar/hydrochlorothiazide.  Hyperlipidemia recently on Crestor over  the last number of months and Lexapro as above.  Currently she is due  for a Pap.  The last one was 2 years ago.  There is some question about  shortness of breath under certain times but no specific chest pain or  major change in exercise tolerance.   PAST MEDICAL HISTORY:  See database.  Breast biopsy in 1970 and 1986.  She is gravida 4, para 4, last Pap was 2 years ago.  Last mammogram was  January 2008.  She is diagnosed with ulcerative colitis.  Had been on  Asacol but stopped it because of side effects for the last 6 months.  Situational depression/anxiety on Lexapro.  Hypertension,  hyperlipidemia.   FAMILY  HISTORY:  Father died of pancreatic CA as well as maternal  grandparent. Mom has angina.  Three sisters are alive and well but many  of them or on anti-hypertensive medications.  There is a grandson on  thyroid medication.  Negative for diabetes or osteoporosis or stroke.   SOCIAL HISTORY:  Teaches preschool.  Social alcohol.  Smoked on a daily  bases up until about 1-1/2 years ago.  Currently no regular exercise.  Household of just 1.   REVIEW OF SYSTEMS:  As per HPI.  GI negative, GU negative at present.   MEDICATIONS:  1. Lexapro 10 mg.  2. Crestor 10 mg.  3. Benicar 20/12.5 hydrochlorothiazide daily.   DRUG ALLERGIES:  None recorded.   OBJECTIVE:  Height 5 feet 2-1/4 inches.  Weight 152, pulse 72 regular.  Blood pressure 100/60.  WDWN, healthy appearing lady in no acute  distress.  HEENT:  Is grossly normal.  NECK:  Supple without mass, thyromegaly or bruit.  CHEST: CTAP.  Equal.  CARDIAC:  S1, S2 __________ murmurs.  Peripheral pulses present __________  ABDOMEN:  Soft, no organomegaly, guarding or rebound.  NEUROLOGIC:  Appears grossly intact.   IMPRESSION:  1. Hypertension.  2. Hyperlipidemia.  3. Mood with anxiety - stable, on medications.  4. Concern about weight gain.  5. Ulcerative colitis, quiescent.  6. Concerns about cost.   PLAN:  Get copy of her most recent EKG, can try  lisinopril/hydrochlorothiazide 20/12/5 instead of  Benicar/hydrochlorothiazide.  She may try the simvastatin 40 mg 1 p.o.  q. day, although this is not equivalent in potency as 10 mg of Crestor.  We will follow up her lipids in 6 to 8 weeks to assess response and she  will then come back for an ROV to review results and to do a well woman  exam.  At that time we will assess for other health care needs.     Shelley Wright. Panosh, MD  Electronically Signed    WKP/MedQ  DD: 10/19/2006  DT: 10/20/2006  Job #: [id number]

## 2011-01-31 NOTE — Assessment & Plan Note (Signed)
[facility] OFFICE NOTE   Shelley Wright, Shelley Wright                       MRN:          [id number]  DATE:04/30/2006                            DOB:          March 27, 1946    Shelley Wright is a very nice 65 year old white female, a patient of mine, with  ulcerative colitis.  She has moved away to [place], [place], for the past 5  years, then has moved back 6 months ago.  Her last appointment with [place] was  in February of 2001.  She had a full colonoscopy on November 07, 1999, which  showed acute proctitis.  In [place] the patient developed acute flare-up of  the colitis in 2005 while being stressed out about the wedding of her son.  She developed pancolitis.  She had a flexible sigmoidoscopy and CT scan of  the abdomen, and was almost hospitalized but eventually was treated as an  outpatient with steroids for several months, 4.8 grams of Asacol.  She lost  about 20 pounds, which she eventually gained back.  She slowly tapered off  her Asacol when she moved to [place] 6 months ago, and has been on no  medication for the past 3 months.  She is in remission, having bowel  movements every 3 days, no bleeding, mucus, abdominal pain.  She has no  other complaints.   MEDICATIONS:  1. Benicar 20 mg p.o. daily.  2. Crestor 5 mg p.o. daily.   PAST MEDICAL HISTORY:  Significant for high blood pressure and high  cholesterol.   FAMILY HISTORY:  Negative for colon cancer or inflammatory bowel disease.   SOCIAL HISTORY:  She is married with 4 children.  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   REVIEW OF SYSTEMS:  Positive for back pain.   PHYSICAL EXAMINATION:  VITAL SIGNS:  Blood pressure 110/66, pulse 88, weight  146 pounds.  GENERAL:  She was alert, oriented, in no distress.  HEENT:  Sclerae is nonicteric.  LUNGS:  Clear to auscultation.  CARDIAC:  Cor with normal S1, normal S2.  ABDOMEN:  Soft,  nontender with normoactive bowel sounds.  Liver edge at the  costal margin.  RECTAL:  Hemoccult negative.  Formed stool is in the ampulla.  EXTREMITIES:  No edema.   IMPRESSION:  A 65 year old white female with ulcerative colitis.   INITIAL DIAGNOSIS:  Proctitis in 2001, subsequently progressed to pancolitis  in 2005.  Last colonoscopy 2 years ago.   PLAN:  1. The patient needs to be on a maintenance dose of Asacol.  I suggest 6 a      day.  If she has a flare-up, she needs to go immediately up to 4.8      grams a day.  I have given her a prescription.  2. Currently she can stay on a high fiber or regular diet.  3. I need to see her at least on a yearly basis, but suggest repeat      colonoscopy in 5 years from the last colonoscopy, which was done 2  years ago, which would be in August 2010, or earlier if she develops a      flare-up.  I told her to call us immediately if she develops a flare-      up.  4. Referral to an internist.  We will refer her to Dr. Shanon Ace at      [facility] for control of her blood pressure and general medical care.                                   Shelley Wright. Olevia Perches, MD   DMB/MedQ  DD:  04/30/2006  DT:  05/01/2006  Job #:  [id number]   cc:   Standley Brooking. Regis Bill, MD

## 2011-02-27 ENCOUNTER — Other Ambulatory Visit: Payer: Self-pay | Admitting: Internal Medicine

## 2011-03-05 ENCOUNTER — Other Ambulatory Visit (INDEPENDENT_AMBULATORY_CARE_PROVIDER_SITE_OTHER): Payer: BC Managed Care – HMO

## 2011-03-05 DIAGNOSIS — Z0000 Encounter for general adult medical examination without abnormal findings: Secondary | ICD-10-CM

## 2011-03-05 LAB — TSH: TSH: 1.24 u[IU]/mL (ref 0.35–5.50)

## 2011-03-05 LAB — BASIC METABOLIC PANEL
BUN: 22 mg/dL (ref 6–23)
Calcium: 9.6 mg/dL (ref 8.4–10.5)
GFR: 132.27 mL/min (ref >60.00)
Potassium: 4.1 mEq/L (ref 3.5–5.1)
Sodium: 139 mEq/L (ref 135–145)

## 2011-03-05 LAB — POCT URINALYSIS DIPSTICK
Glucose, UA: NEGATIVE
Nitrite, UA: NEGATIVE
Protein, UA: NEGATIVE
Urobilinogen, UA: 0.2

## 2011-03-05 LAB — CBC WITH DIFFERENTIAL/PLATELET
Eosinophils Relative: 6.6 % — ABNORMAL HIGH (ref 0.0–5.0)
HCT: 40.2 % (ref 36.0–46.0)
Lymphocytes Relative: 23.3 % (ref 12.0–46.0)
Lymphs Abs: 2.4 10*3/uL (ref 0.7–4.0)
Monocytes Relative: 5.7 % (ref 3.0–12.0)
Platelets: 247 10*3/uL (ref 150.0–400.0)
WBC: 10.1 10*3/uL (ref 4.5–10.5)

## 2011-03-05 LAB — HEPATIC FUNCTION PANEL
Albumin: 4.5 g/dL (ref 3.5–5.2)
Alkaline Phosphatase: 46 U/L (ref 39–117)
Total Protein: 6.9 g/dL (ref 6.0–8.3)

## 2011-03-05 LAB — LIPID PANEL
Cholesterol: 217 mg/dL — ABNORMAL HIGH (ref 0–200)
HDL: 55.3 mg/dL (ref >39.00)
Total CHOL/HDL Ratio: 4
Triglycerides: 155 mg/dL — ABNORMAL HIGH (ref 0.0–149.0)
VLDL: 31 mg/dL (ref 0.0–40.0)

## 2011-03-05 LAB — LDL CHOLESTEROL, DIRECT: Direct LDL: 135.2 mg/dL

## 2011-03-26 ENCOUNTER — Other Ambulatory Visit: Payer: Self-pay

## 2011-03-27 ENCOUNTER — Encounter: Payer: Self-pay | Admitting: Internal Medicine

## 2011-04-02 ENCOUNTER — Encounter: Payer: Self-pay | Admitting: Internal Medicine

## 2011-04-02 ENCOUNTER — Other Ambulatory Visit (HOSPITAL_COMMUNITY)
Admission: RE | Admit: 2011-04-02 | Discharge: 2011-04-02 | Disposition: A | Discharge status: Home / Self Care | Payer: BC Managed Care – HMO | Source: Ambulatory Visit | Attending: Internal Medicine | Admitting: Internal Medicine

## 2011-04-02 ENCOUNTER — Ambulatory Visit (INDEPENDENT_AMBULATORY_CARE_PROVIDER_SITE_OTHER): Payer: BC Managed Care – HMO | Admitting: Internal Medicine

## 2011-04-02 VITALS — BP 110/80 | HR 66 | Ht 62.0 in | Wt 156.0 lb

## 2011-04-02 DIAGNOSIS — I10 Essential (primary) hypertension: Secondary | ICD-10-CM

## 2011-04-02 DIAGNOSIS — M791 Myalgia, unspecified site: Secondary | ICD-10-CM

## 2011-04-02 DIAGNOSIS — E785 Hyperlipidemia, unspecified: Secondary | ICD-10-CM

## 2011-04-02 DIAGNOSIS — Z0000 Encounter for general adult medical examination without abnormal findings: Secondary | ICD-10-CM

## 2011-04-02 DIAGNOSIS — Z136 Encounter for screening for cardiovascular disorders: Secondary | ICD-10-CM

## 2011-04-02 DIAGNOSIS — Z01419 Encounter for gynecological examination (general) (routine) without abnormal findings: Secondary | ICD-10-CM | POA: Insufficient documentation

## 2011-04-02 DIAGNOSIS — Z8719 Personal history of other diseases of the digestive system: Secondary | ICD-10-CM

## 2011-04-02 NOTE — Patient Instructions (Signed)
Stop the crestor for about 3 weeks or so and see if  Body aches get better . If they do then restart and if comes back then call  About advice. We can change meds if needed then.  Make sure getting vit d [phone] iu in diet or supplement sometimes deficicncy willl add to body aches on lipid meds .

## 2011-04-02 NOTE — Progress Notes (Signed)
  Subjective:    Patient ID: Shelley Wright, female    DOB: May 27, 1946, 65 y.o.   MRN: [id number]  HPI Patient comes in today for preventive visit and follow-up of medical issues. Update of her history since her last visit. Has some achiness and usnsure what this is . NO injury HT on meds no se LIPIDS  On  crestor no se in the past Tobacco    Stopped off and on  Used chantix Ansiety not using meds doing ok   Review of Systems ROS:  GEN/ HEENTNo fever, significant weight changes sweats headaches vision problems hearing changes, CV/ PULM; No chest pain shortness of breath cough, syncope,edema  change in exercise tolerance. GI /GU: No adominal pain, vomiting, change in bowel habits. No blood in the stool. No significant GU symptoms. SKIN/HEME: ,no acute skin rashes suspicious lesions or bleeding. No lymphadenopathy, nodules, masses.  NEURO/ PSYCH:  No neurologic signs such as weakness numbness No depression anxiety. IMM/ Allergy: No unusual infections.  Allergy .    Hx of arthtiris  Am stiffness  REST of 12 system review negative or as  Per hpi Past history family history social history reviewed in the electronic medical record.     Objective:   Physical Exam Physical Exam: Vital signs reviewed IBB:CWUG is a well-developed well-nourished alert cooperative  female who appears her stated age in no acute distress.  HEENT: normocephalic  traumatic , Eyes: PERRL EOM's full, conjunctiva clear, Nares: paten,t no deformity discharge or tenderness., Ears: no deformity EAC's clear TMs with normal landmarks. Mouth: clear OP, no lesions, edema.  Moist mucous membranes. Dentition in adequate repair. NECK: supple without masses, thyromegaly or bruits. CHEST/PULM:  Clear to auscultation and percussion breath sounds equal no wheeze , rales or rhonchi. No chest wall deformities or tenderness. CV: PMI is nondisplaced, S1 S2 no gallops, murmurs, rubs. Peripheral pulses are full without delay.No JVD .    Breast: normal by inspection . No dimpling, discharge, masses, tenderness or discharge . LN: no cervical axillary inguinal adenopathy  ABDOMEN: Bowel sounds normal nontender  No guard or rebound, no hepato splenomegal no CVA tenderness.  No hernia. Extremtities:  No clubbing cyanosis or edema, no acute joint swelling or redness no focal atrophy NEURO:  Oriented x3, cranial nerves 3-12 appear to be intact, no obvious focal weakness,gait within normal limits no abnormal reflexes or asymmetrical SKIN: No acute rashes normal turgor, color, no bruising or petechiae. PSYCH: Oriented, good eye contact, no obvious depression anxiety, cognition and judgment appear normal. Pelvic: NL ext GU, labia clear without lesions or rash . Vagina no lesions .Cervix: clear  UTERUS: Neg CMT Adnexa:  clear no masses . PAP done    EKG  nsr but ? r in v2   Rest or anterior forces are normal none to compare but this may be when had  Nl myoview a hew years ago Labs reviewed with patient.  Assessment & Plan:  Preventive Health Care Counseled regarding healthy nutrition, exercise, sleep, injury prevention, calcium vit d and healthy weight . No tobacco Achiness non descript   Always could be meds   Not an arthritis pain.  Will trial off an on Ht controlled no change Hx of UC quiescent.

## 2011-04-03 ENCOUNTER — Encounter: Payer: Self-pay | Admitting: Internal Medicine

## 2011-04-04 ENCOUNTER — Other Ambulatory Visit: Payer: Self-pay | Admitting: Internal Medicine

## 2011-04-05 ENCOUNTER — Encounter: Payer: Self-pay | Admitting: Internal Medicine

## 2011-04-05 DIAGNOSIS — Z8719 Personal history of other diseases of the digestive system: Secondary | ICD-10-CM | POA: Insufficient documentation

## 2011-04-05 DIAGNOSIS — M791 Myalgia, unspecified site: Secondary | ICD-10-CM | POA: Insufficient documentation

## 2011-04-07 ENCOUNTER — Encounter: Payer: Self-pay | Admitting: *Deleted

## 2011-04-25 ENCOUNTER — Other Ambulatory Visit: Payer: Self-pay | Admitting: Internal Medicine

## 2011-06-12 ENCOUNTER — Telehealth: Payer: Self-pay | Admitting: *Deleted

## 2011-06-12 NOTE — Telephone Encounter (Signed)
Agree with advice

## 2011-06-12 NOTE — Telephone Encounter (Signed)
Pt was bitten by a red ant 2 days ago and hand is extremely swollen but not painful.  Asked what she can do tonight as she was given appt to see Dr. Elease Hashimoto tomorrow.  Suggested Benadryl tonight.

## 2011-06-13 ENCOUNTER — Ambulatory Visit (INDEPENDENT_AMBULATORY_CARE_PROVIDER_SITE_OTHER): Payer: BC Managed Care – HMO | Admitting: Family Medicine

## 2011-06-13 ENCOUNTER — Encounter: Payer: Self-pay | Admitting: Family Medicine

## 2011-06-13 VITALS — BP 120/80 | Temp 98.2°F | Wt 154.0 lb

## 2011-06-13 DIAGNOSIS — S60569A Insect bite (nonvenomous) of unspecified hand, initial encounter: Secondary | ICD-10-CM

## 2011-06-13 DIAGNOSIS — R229 Localized swelling, mass and lump, unspecified: Secondary | ICD-10-CM

## 2011-06-13 DIAGNOSIS — R609 Edema, unspecified: Secondary | ICD-10-CM

## 2011-06-13 DIAGNOSIS — Z23 Encounter for immunization: Secondary | ICD-10-CM

## 2011-06-13 DIAGNOSIS — W57XXXA Bitten or stung by nonvenomous insect and other nonvenomous arthropods, initial encounter: Secondary | ICD-10-CM

## 2011-06-13 MED ORDER — METHYLPREDNISOLONE ACETATE 80 MG/ML IJ SUSP
80.0000 mg | Freq: Once | INTRAMUSCULAR | Status: AC
  Administered 2011-06-13: 80 mg via INTRAMUSCULAR

## 2011-06-13 NOTE — Progress Notes (Signed)
  Subjective:    Patient ID: Shelley Wright, female    DOB: 01/16/1946, 65 y.o.   MRN: [id number]  HPI Patient witnessed bite right hand third MCP joint by red ant 2 days ago. Had progressive swelling and pruritus since then. No pain. No fever or chills. Has tried Benadryl with minimal relief. Had some progressive swelling and itching since then. No dyspnea.  No generalized rash.   Review of Systems  Constitutional: Negative for fever and chills.  HENT: Negative for trouble swallowing.   Respiratory: Negative for shortness of breath.   Skin: Positive for rash.       Objective:   Physical Exam  Constitutional: She appears well-developed and well-nourished.  Cardiovascular: Normal rate and regular rhythm.   Pulmonary/Chest: Breath sounds normal. No respiratory distress. She has no wheezes. She has no rales.  Musculoskeletal:       Right hand reveals some diffuse edema and very faint erythema but nontender. No significant warmth. She has area of superficial blistering right third MCP joint. No cellulitis changes          Assessment & Plan:  Localized allergic reaction to insect bite. Continue ice, elevation, and antihistamine. Given progressive nature of swelling from allergic reaction- Depo-Medrol 80 mg IM. Follow up promptly for any worsening or new symptoms

## 2011-06-13 NOTE — Patient Instructions (Signed)
Elevate hand and consider ice 20-30 minutes 2-3 times daily. Continue with over the counter antihistamine.

## 2011-07-07 ENCOUNTER — Other Ambulatory Visit: Payer: Self-pay | Admitting: Internal Medicine

## 2011-07-11 ENCOUNTER — Other Ambulatory Visit: Payer: Self-pay | Admitting: Internal Medicine

## 2011-09-08 ENCOUNTER — Other Ambulatory Visit: Payer: Self-pay | Admitting: Internal Medicine

## 2011-09-24 ENCOUNTER — Other Ambulatory Visit: Payer: Self-pay | Admitting: Internal Medicine

## 2011-09-25 ENCOUNTER — Other Ambulatory Visit: Payer: Self-pay | Admitting: Internal Medicine

## 2011-10-20 ENCOUNTER — Other Ambulatory Visit: Payer: Self-pay

## 2011-10-20 MED ORDER — ROSUVASTATIN CALCIUM 10 MG PO TABS: 10.0000 mg | ORAL_TABLET | Freq: Every day | ORAL | Status: DC

## 2011-10-20 NOTE — Telephone Encounter (Signed)
Rx sent to pharmacy for Crestor.

## 2011-11-14 ENCOUNTER — Encounter: Payer: Self-pay | Admitting: Internal Medicine

## 2011-11-14 ENCOUNTER — Ambulatory Visit (INDEPENDENT_AMBULATORY_CARE_PROVIDER_SITE_OTHER): Payer: BC Managed Care – HMO | Admitting: Internal Medicine

## 2011-11-14 VITALS — BP 110/70 | HR 77 | Temp 98.0°F | Wt 151.0 lb

## 2011-11-14 DIAGNOSIS — Z87891 Personal history of nicotine dependence: Secondary | ICD-10-CM | POA: Insufficient documentation

## 2011-11-14 DIAGNOSIS — J988 Other specified respiratory disorders: Secondary | ICD-10-CM

## 2011-11-14 DIAGNOSIS — R05 Cough: Secondary | ICD-10-CM

## 2011-11-14 DIAGNOSIS — J22 Unspecified acute lower respiratory infection: Secondary | ICD-10-CM

## 2011-11-14 NOTE — Patient Instructions (Signed)

## 2011-11-14 NOTE — Progress Notes (Signed)
  Subjective:    Patient ID: Shelley Wright, female    DOB: 01-09-1946, 66 y.o.   MRN: [id number]  HPI Patient comes in today for SDA for  new problem evaluation. 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 Col workers suggest she come in and "get a Z-Pak." Onset 4 days ago of runny stuffy nose cough and drainage. No fever face pain . Some sob feeling but no wheezing of doe.  self rx  with delsym increased coughing at night some Tobacco :  None for 2 mos.  On Chantix    .Review of Systems Negative chest pain hemoptysis fever chills severe headache  Past history family history social history reviewed in the electronic medical record.     Objective:   Physical Exam WDWN in NAD  quiet respirations; congested  somewhat hoarse. Non toxic . HEENT: Normocephalic ;atraumatic , Eyes;  PERRL, EOMs  Full, lids and conjunctiva clear,,Ears: no deformities, canals nl, TM landmarks normal, Nose: no deformity t congested;face non  tender Mouth : OP clear without lesion or edema . Neck: Supple without adenopathy or masses or bruits Chest:  Clear to A&P without wheezes rales or rhonchi CV:  S1-S2 no gallops or murmurs peripheral perfusion is normal Skin :nl perfusion and no acute rashes       Assessment & Plan:  Acute uri  Viral    Disc about cause and   Expectant management. May last another week but to call with alarm features. Antibiotic  Only help certain bacterial infections. Disc comfort sx relief.  Tobacco ex currently on chantix and doing well.    encouraged to remain tobacco free.  Has some vivid dreams but not  Adverse.   Ok to get chest  Xray with above hx .  As discussed   HT   Does not seem like an ace cough but  Will fu if  persistent or progressive   Total visit 70mns > 50% spent counseling and coordinating care

## 2011-11-18 ENCOUNTER — Encounter: Payer: Self-pay | Admitting: Internal Medicine

## 2011-11-18 ENCOUNTER — Ambulatory Visit (INDEPENDENT_AMBULATORY_CARE_PROVIDER_SITE_OTHER): Payer: BC Managed Care – HMO | Admitting: Internal Medicine

## 2011-11-18 VITALS — BP 100/70 | HR 78 | Temp 98.1°F

## 2011-11-18 DIAGNOSIS — J069 Acute upper respiratory infection, unspecified: Secondary | ICD-10-CM

## 2011-11-18 DIAGNOSIS — H1030 Unspecified acute conjunctivitis, unspecified eye: Secondary | ICD-10-CM

## 2011-11-18 MED ORDER — POLYMYXIN B-TRIMETHOPRIM 10000-0.1 UNIT/ML-% OP SOLN: 1.0000 [drp] | OPHTHALMIC | Status: AC

## 2011-11-18 NOTE — Patient Instructions (Signed)
Use one drop every 4 hours while awake or about  4 x per day  After using warm compresses. Do not use contacts until a lot better. If recurs then see your eye doctor  Or all for advice.

## 2011-11-18 NOTE — Progress Notes (Signed)
  Subjective:    Patient ID: Shelley Wright, female    DOB: 07-31-1946, 66 y.o.   MRN: [id number]  HPI Patient comes in today for SDA for  new problem evaluation. resp inf getting better but now having right eye swelling and discharge  Slept with contacts and started redness and now crusty for now.  No change in vision. No pain or sensitivity to light.  No fever . No Treat ment done   Review of Systems Neg cp sob fever   Past history family history social history reviewed in the electronic medical record.     Objective:   Physical Exam WDWN in nad with right eye redness  HEENT: Normocephalic ;atraumatic , Eyes;  PERRL, EOMs  Full, lids and conjunctiva right 2+ erythema no lesions  Left slight redness no swelling ,,Ears: no deformities, canals nl, TM landmarks normal, Nose: no midl congestion  Mouth : OP clear without lesion or edema . 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

## 2011-11-19 ENCOUNTER — Encounter: Payer: Self-pay | Admitting: Internal Medicine

## 2011-11-19 DIAGNOSIS — H1030 Unspecified acute conjunctivitis, unspecified eye: Secondary | ICD-10-CM | POA: Insufficient documentation

## 2012-01-03 ENCOUNTER — Other Ambulatory Visit: Payer: Self-pay | Admitting: Internal Medicine

## 2012-01-29 ENCOUNTER — Other Ambulatory Visit: Payer: Self-pay | Admitting: Internal Medicine

## 2012-01-29 NOTE — Telephone Encounter (Signed)
Ok to refill x 4 

## 2012-01-29 NOTE — Telephone Encounter (Signed)
Pt last seen 11/18/11.  Rx last filled 09/08/11 #[id number] [id number].  Pls advise.

## 2012-04-10 ENCOUNTER — Other Ambulatory Visit: Payer: Self-pay | Admitting: Internal Medicine

## 2012-05-09 ENCOUNTER — Other Ambulatory Visit: Payer: Self-pay | Admitting: Internal Medicine

## 2012-06-03 ENCOUNTER — Encounter: Payer: Self-pay | Admitting: Internal Medicine

## 2012-06-17 ENCOUNTER — Other Ambulatory Visit: Payer: Self-pay | Admitting: Internal Medicine

## 2012-07-05 ENCOUNTER — Ambulatory Visit (INDEPENDENT_AMBULATORY_CARE_PROVIDER_SITE_OTHER): Payer: BC Managed Care – PPO | Admitting: Internal Medicine

## 2012-07-05 ENCOUNTER — Encounter: Payer: Self-pay | Admitting: Internal Medicine

## 2012-07-05 VITALS — BP 142/82 | HR 83 | Temp 97.7°F | Wt 151.0 lb

## 2012-07-05 DIAGNOSIS — E785 Hyperlipidemia, unspecified: Secondary | ICD-10-CM

## 2012-07-05 DIAGNOSIS — IMO0001 Reserved for inherently not codable concepts without codable children: Secondary | ICD-10-CM

## 2012-07-05 DIAGNOSIS — F329 Major depressive disorder, single episode, unspecified: Secondary | ICD-10-CM

## 2012-07-05 DIAGNOSIS — Z8249 Family history of ischemic heart disease and other diseases of the circulatory system: Secondary | ICD-10-CM | POA: Insufficient documentation

## 2012-07-05 DIAGNOSIS — Z23 Encounter for immunization: Secondary | ICD-10-CM

## 2012-07-05 DIAGNOSIS — J069 Acute upper respiratory infection, unspecified: Secondary | ICD-10-CM

## 2012-07-05 DIAGNOSIS — M791 Myalgia, unspecified site: Secondary | ICD-10-CM

## 2012-07-05 DIAGNOSIS — I10 Essential (primary) hypertension: Secondary | ICD-10-CM

## 2012-07-05 DIAGNOSIS — Z87891 Personal history of nicotine dependence: Secondary | ICD-10-CM

## 2012-07-05 DIAGNOSIS — I8390 Asymptomatic varicose veins of unspecified lower extremity: Secondary | ICD-10-CM

## 2012-07-05 LAB — CBC WITH DIFFERENTIAL/PLATELET
Basophils Relative: 0.5 % (ref 0.0–3.0)
Eosinophils Relative: 5 % (ref 0.0–5.0)
HCT: 43.6 % (ref 36.0–46.0)
Lymphs Abs: 3.3 10*3/uL (ref 0.7–4.0)
MCV: 97.2 fl (ref 78.0–100.0)
Monocytes Absolute: 1 10*3/uL (ref 0.1–1.0)
RBC: 4.48 Mil/uL (ref 3.87–5.11)
WBC: 12.3 10*3/uL — ABNORMAL HIGH (ref 4.5–10.5)

## 2012-07-05 LAB — BASIC METABOLIC PANEL
Chloride: 101 mEq/L (ref 96–112)
Potassium: 4.3 mEq/L (ref 3.5–5.1)
Sodium: 139 mEq/L (ref 135–145)

## 2012-07-05 LAB — HEPATIC FUNCTION PANEL
ALT: 22 U/L (ref 0–35)
Bilirubin, Direct: 0.1 mg/dL (ref 0.0–0.3)
Total Protein: 7.5 g/dL (ref 6.0–8.3)

## 2012-07-05 LAB — TSH: TSH: 0.95 u[IU]/mL (ref 0.35–5.50)

## 2012-07-05 LAB — LIPID PANEL: Triglycerides: 87 mg/dL (ref 0.0–149.0)

## 2012-07-05 NOTE — Patient Instructions (Signed)
Blood pressure control , not smoking, help reduce risk of CV events as well as aspirin. LIPID statin med can also decrease risk if high. Uncertain risk benefit in your particular situation.   Advise  Cardiology  Opinion about risk assessment as to whether further evaluation indicated such as CAC score or other screening.  Stroke Prevention Some medical conditions and behaviors are associated with an increased chance of having a stroke. You may prevent a stroke by making healthy choices and managing medical conditions. Reduce your risk of having a stroke by:  Staying physically active. Get at least 30 minutes of activity on most or all days.  Not smoking. It may also be helpful to avoid exposure to secondhand smoke.  Limiting alcohol use. Moderate alcohol use is considered to be:  No more than 2 drinks per day for men.  No more than 1 drink per day for nonpregnant women.  Eating healthy foods.  Include 5 or more servings of fruits and vegetables a day.  Certain diets may be prescribed to address high blood pressure, high cholesterol, diabetes, or obesity.  Managing your cholesterol levels.  A low-saturated fat, low-trans fat, low-cholesterol, and high-fiber diet may control cholesterol levels.  Take any prescribed medicines to control cholesterol as directed by your caregiver.  Managing your diabetes.  A controlled-carbohydrate, controlled-sugar diet is recommended to manage diabetes.  Take any prescribed medicines to control diabetes as directed by your caregiver.  Controlling your high blood pressure (hypertension).  A low-salt (sodium), low-saturated fat, low-trans fat, and low-cholesterol diet is recommended to manage high blood pressure.  Take any prescribed medicines to control hypertension as directed by your caregiver.  Maintaining a healthy weight.  A reduced-calorie, low-sodium, low-saturated fat, low-trans fat, low-cholesterol diet is recommended to manage  weight.  Stopping drug abuse.  Avoiding birth control pills.  Talk to your caregiver about the risks of taking birth control pills if you are over 78 years old, smoke, get migraines, or have ever had a blood clot.  Getting evaluated for sleep disorders (sleep apnea).  Talk to your caregiver about getting a sleep evaluation if you snore a lot or have excessive sleepiness.  Taking medicines as directed by your caregiver.  For some people, aspirin or blood thinners (anticoagulants) are helpful in reducing the risk of forming abnormal blood clots that can lead to stroke. If you have the irregular heart rhythm of atrial fibrillation, you should be on a blood thinner unless there is a good reason you cannot take them.  Understand all your medicine instructions. SEEK IMMEDIATE MEDICAL CARE IF:   You have sudden weakness or numbness of the face, arm, or leg, especially on one side of the body.  You have sudden confusion.  You have trouble speaking (aphasia) or understanding.  You have sudden trouble seeing in one or both eyes.  You have sudden trouble walking.  You have dizziness.  You have a loss of balance or coordination.  You have a sudden, severe headache with no known cause.  You have new chest pain or an irregular heartbeat. Any of these symptoms may represent a serious problem that is an emergency. Do not wait to see if the symptoms will go away. Get medical help right away. Call your local emergency services (911 in [place] Do not drive yourself to the hospital. Document Released: 10/09/2004 Document Revised: 11/24/2011 Document Reviewed: 04/21/2011 [facility] Patient Information 2013 [place]. Heart Disease Prevention Heart disease can lead to heart attacks and strokes. This  is a leading cause of death. Heart disease can be inherited and can be caused from the lifestyle you lead. You can do a lot to keep your heart and blood vessels healthy.  WHAT SHOULD I DO EACH DAY  TO KEEP MY HEART HEALTHY?  Do not smoke.  Follow a healthy eating plan as recommended by your caregiver or dietitian.  Be active for a total of 30 minutes most days. Ask your caregiver what activities are best for you.  Limit the amount of alcohol you drink.  Involve family and friends to help you with a healthy lifestyle. HOW DOES HEART DISEASE CAUSE HIGH BLOOD PRESSURE?  Narrowed blood vessels leave a smaller opening for blood to flow through. It is like turning on a garden hose and holding your thumb over the opening. The smaller opening makes the water shoot out with more pressure. In the same way, narrowed blood vessels can lead to high blood pressure. Other factors, such as kidney problems and being overweight, also can lead to high blood pressure.  If you have high blood pressure you may need to take blood pressure medicine every day. Some types of blood pressure medicine can also help keep your kidneys healthy.  Many people with diabetes also have high blood pressure. If you have heart, eye, or kidney problems from diabetes, high blood pressure can make them worse. HOW DO MY BLOOD VESSELS GET CLOGGED?  Cholesterol is a substance that is made by the body and used for many important functions. It is also found in food that comes from animals. When your cholesterol is high, it can stick to the insides of your blood vessels, making them narrowed and even clogged. This problem is called atherosclerosis.  Narrowed and clogged blood vessels make it harder for blood to get to important body organs. This can cause problems such as:  Chest pain (angina). Angina can cause temporary pain in your chest, arms, shoulders, or back. You may feel the pain more when your heart beats faster, such as when you exercise. The pain may go away when you rest. You also may feel very weak and sweaty.  A heart attack. A heart attack happens when a blood vessel in or near the heart becomes blocked. Not enough  blood is getting to the heart. During a heart attack, you may have chest pain in your chest, arms, shoulders, or back along with nausea, indigestion, extreme weakness, and sweating. WHAT CAN I DO TO PREVENT HEART DISEASE?   Keep your blood pressure under control as recommended by your caregiver.  Keep your cholesterol under control. Have it checked at least once a year. Target cholesterol levels for most people are:  Total blood cholesterol level: Below 200.  LDL (bad) cholesterol: Below 100.  HDL (good) cholesterol: Above 40 in men and above 50 in women.  Triglycerides (another type of fat in the blood): Below 150.  Make physical activity a part of your daily routine. Check with your caregiver to learn what activities are best for you.  Make sure that the foods you eat are "heart-healthy."  Include foods high in fiber, such as oat bran, oatmeal, whole-grain breads and cereals.  Cut back on fried foods and foods high in saturated fat. This includes foods such as meats, butter, whole dairy products, shortening, and coconut or palm oil.  Avoid salty foods such as canned food, luncheon meat, salty snacks, and fast food.  Eat more fruits and vegetables.  Drink less alcohol.  Lose  weight as recommended by your caregiver.  If you smoke, quit. Your caregiver can help you with quitting options.  Ask your caregiver whether you should take a daily aspirin. Studies have shown that taking aspirin can help reduce your risk of heart disease and stroke.  Take your prescribed medicines as directed. WHAT ARE THE WARNING SIGNS OF A HEART ATTACK? You may have one or more of the following warning signs:  Chest pain or discomfort.  Pain or discomfort in your arms, back, jaw, or neck.  Indigestion or stomach pain.  Shortness of breath.  Sweating.  Nausea or vomiting.  Lightheadedness.  No warning signs at all or they may come and go. FOR MORE INFORMATION  To find out more about heart  disease and stroke prevention, visit the American Heart Association website at www.americanheart.org Document Released: 04/15/2004 Document Revised: 03/02/2012 Document Reviewed: 10/29/2007 [facility] Patient Information 2013 [place].

## 2012-07-05 NOTE — Progress Notes (Signed)
Subjective:    Patient ID: Shelley Wright, female    DOB: 08-07-1946, 66 y.o.   MRN: [id number]  HPI Patient comes in today for follow up of  multiple medical problems.  Last ov was 18 months ago . BP  On meds   No se noted. ? [place] control  Has st today and mild congestion getting uri no fever  LIPIDS:  Was on Crestor  Went off medication as per last visit but then  husband had stroke and  Went back on it.   On 10 mg    Noted no diference in muscle aches.  Has ? About risk of stroke and if should have carotid dopplers. No cp sob neuro sx ria sx  Hx of nuclear med stress test in 2008  .  No tobacco currently  Mood  Still on lexapro. Review of Systems Neg cps sob cough syncope bleeding  Neuro sx . Past history family history social history reviewed in the electronic medical record. Outpatient Encounter Prescriptions as of 07/05/2012  Medication Sig Dispense Refill  . escitalopram (LEXAPRO) 10 MG tablet TAKE 1/2 TO 1 TABLET BY MOUTH ONCE DAILY.  30 tablet  4  . folic acid (FOLVITE) 1 MG tablet TAKE 1 TABLET EVERY DAY  100 tablet  2  . lisinopril-hydrochlorothiazide (PRINZIDE,ZESTORETIC) 20-12.5 MG per tablet TAKE 1 TABLET BY MOUTH DAILY.  30 tablet  0  . rosuvastatin (CRESTOR) 10 MG tablet Take 10 mg by mouth daily.      Marland Kitchen DISCONTD: CHANTIX CONTINUING MONTH PAK 1 MG tablet TAKE AS DIRECTED  56 tablet  0       Objective:   Physical Exam BP 142/82  Pulse 83  Temp 97.7 F (36.5 C) (Oral)  Wt 151 lb (68.493 kg)  SpO2 96% WDWN in  Repeat 120/80 right   Physical Exam: Vital signs reviewed AST:MHDQ is a well-developed well-nourished alert cooperative  white female who appears her stated age in no acute distress.  Mild congestion HEENT: normocephalic atraumatic , Eyes: PERRL EOM's full, conjunctiva clear, Nares: paten,t no deformity discharge or tenderness. Mild congestion, Ears: no deformity  TMs with normal landmarks. Mouth: clear OP, no lesions, edema.  Moist mucous membranes.  NECK:  supple without masses, thyromegaly or bruits. Heard  CHEST/PULM:  Clear to auscultation and percussion breath sounds equal no wheeze , rales or rhonchi. No chest wall deformities or tenderness. CV: PMI is nondisplaced, S1 S2 no gallops, murmurs, rubs. Peripheral pulses are full without delay.No JVD .  ABDOMEN: Bowel sounds normal nontender  No guard or rebound, no hepato splenomegal no CVA tenderness.  . Extremtities:  No clubbing cyanosis or edema, no acute joint swelling or redness no focal atrophy NEURO:  Oriented x3, cranial nerves 3-12 appear to be intact, no obvious focal weakness,gait within normal limits no abnormal reflexes or asymmetrical SKIN: No acute rashes normal turgor, color, no bruising or petechiae. PSYCH: Oriented, good eye contact, no obvious depression anxiety, cognition and judgment appear normal. LN: no cervical  adenopathy    Assessment & Plan:   HT  Repeat bp 122/0  Presumed controlled  Uri early.  Viral no complication LIPIDS   ? About medication  Risk benefit.    Uncertain framingham score   Get labs  fam hx of mom with heart disease young age middle age  Has many ? About meds and carotid screening although this is usually not helpful  without  Mood:  Lexapro  / if helping or not.  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 Insurance will not pay for routine testing .   No personal hx of vascular disease heart disease or DM.  Take asa   Will wait on cards referral until labs back.  Total visit 45mns > 50% spent counseling and coordinating care   Can return for welcome to medicare  In 4 months. Or so

## 2012-08-04 ENCOUNTER — Other Ambulatory Visit: Payer: Self-pay | Admitting: Internal Medicine

## 2012-08-30 ENCOUNTER — Other Ambulatory Visit: Payer: Self-pay | Admitting: Internal Medicine

## 2012-09-12 ENCOUNTER — Other Ambulatory Visit: Payer: Self-pay | Admitting: Internal Medicine

## 2012-11-23 ENCOUNTER — Other Ambulatory Visit: Payer: Self-pay | Admitting: Internal Medicine

## 2012-11-26 ENCOUNTER — Other Ambulatory Visit: Payer: Self-pay | Admitting: Internal Medicine

## 2013-03-07 ENCOUNTER — Other Ambulatory Visit: Payer: Self-pay | Admitting: Internal Medicine

## 2013-06-07 ENCOUNTER — Encounter: Payer: Self-pay | Admitting: Internal Medicine

## 2013-06-28 ENCOUNTER — Encounter: Payer: Self-pay | Admitting: Internal Medicine

## 2013-07-15 ENCOUNTER — Telehealth: Payer: Self-pay | Admitting: Family Medicine

## 2013-07-15 ENCOUNTER — Other Ambulatory Visit: Payer: Self-pay | Admitting: Internal Medicine

## 2013-07-15 NOTE — Telephone Encounter (Signed)
Filled pt's medications for 30 days.  She will need to make an appt to be seen in the office for further refills.  Last seen 06/2012.  Please make an appt with the pt to be seen with [facility].  Thanks!

## 2013-07-18 ENCOUNTER — Other Ambulatory Visit: Payer: Self-pay | Admitting: Family Medicine

## 2013-07-26 ENCOUNTER — Other Ambulatory Visit: Payer: Self-pay | Admitting: Internal Medicine

## 2013-08-29 ENCOUNTER — Other Ambulatory Visit: Payer: Self-pay | Admitting: Internal Medicine

## 2013-09-01 ENCOUNTER — Telehealth: Payer: Self-pay | Admitting: Internal Medicine

## 2013-09-01 ENCOUNTER — Other Ambulatory Visit: Payer: Self-pay | Admitting: Internal Medicine

## 2013-09-01 NOTE — Telephone Encounter (Signed)
Pt has appt sch for 10-18-13. Pt needs refill on crestor,lisinopril and escitalopram sent [facility]

## 2013-09-02 ENCOUNTER — Other Ambulatory Visit: Payer: Self-pay | Admitting: Family Medicine

## 2013-09-02 MED ORDER — ESCITALOPRAM OXALATE 10 MG PO TABS: ORAL_TABLET | ORAL | Status: DC

## 2013-09-02 MED ORDER — ROSUVASTATIN CALCIUM 10 MG PO TABS: ORAL_TABLET | ORAL | Status: DC

## 2013-09-02 MED ORDER — LISINOPRIL-HYDROCHLOROTHIAZIDE 20-12.5 MG PO TABS: ORAL_TABLET | ORAL | Status: DC

## 2013-09-02 NOTE — Telephone Encounter (Signed)
Sent by e-scribe. 

## 2013-10-10 ENCOUNTER — Other Ambulatory Visit: Payer: Self-pay | Admitting: Internal Medicine

## 2013-10-12 ENCOUNTER — Telehealth: Payer: Self-pay | Admitting: Internal Medicine

## 2013-10-12 NOTE — Telephone Encounter (Signed)
This patient has not been seen by [facility] since 2013.  Dr. Mamie Nick filled some other medications for her because she has an appt in Feb.  I filled the Crestor with the same guidelines as the other medications.  Crestor does not come in a generic.  For a medication change, she will need to come in to the office and be seen by another provider while [facility] is out of the office.  I cannot change the medication without physician authorization.

## 2013-10-12 NOTE — Telephone Encounter (Signed)
Pt understands she must come into office for med change. Advised pt to pu samples in office until her appt w/ dr Regis Bill on 2/3. Pt will p/u 1/29.

## 2013-10-12 NOTE — Telephone Encounter (Signed)
shana is calling from [facility] pt is on crestor 42m pt states can not afford the co-pay requesting generic option if possible pt has not taken meds for 8 days

## 2013-10-12 NOTE — Telephone Encounter (Signed)
Called and spoke to [place].  Informed her that the patient has not been seen since 2013.  Since [facility] knows the pt, she authorized the refills.  Pt has a future appt in February.  WP out of the office for the week and Crestor does not come in a generic.  Shana notified that the pt will have to make an appt to see another provider in order for a medication change.  Edwena Blow will notify the pt and have her make an appt.

## 2013-10-12 NOTE — Telephone Encounter (Addendum)
Pt would like the generic rosuvastatin (CRESTOR) 10 MG tablet. The name brand cost too much. Pt is out of meds. Pt has appt on 2/3. [facility] This appt was supposed to be a cpe. Do you want to add extra time (a 15 min slot is available) or resc? Pt is expecting cpe.

## 2013-10-17 ENCOUNTER — Other Ambulatory Visit (INDEPENDENT_AMBULATORY_CARE_PROVIDER_SITE_OTHER): Payer: Medicare Other

## 2013-10-17 ENCOUNTER — Other Ambulatory Visit: Payer: Self-pay

## 2013-10-17 DIAGNOSIS — I10 Essential (primary) hypertension: Secondary | ICD-10-CM

## 2013-10-17 DIAGNOSIS — E785 Hyperlipidemia, unspecified: Secondary | ICD-10-CM

## 2013-10-17 DIAGNOSIS — Z0000 Encounter for general adult medical examination without abnormal findings: Secondary | ICD-10-CM

## 2013-10-17 LAB — BASIC METABOLIC PANEL
BUN: 21 mg/dL (ref 6–23)
CALCIUM: 9.5 mg/dL (ref 8.4–10.5)
CO2: 28 meq/L (ref 19–32)
CREATININE: 0.7 mg/dL (ref 0.4–1.2)
Chloride: 107 mEq/L (ref 96–112)
GFR: 88.97 mL/min (ref >60.00)
GLUCOSE: 92 mg/dL (ref 70–99)
Potassium: 5 mEq/L (ref 3.5–5.1)
SODIUM: 142 meq/L (ref 135–145)

## 2013-10-17 LAB — HEPATIC FUNCTION PANEL
ALBUMIN: 4 g/dL (ref 3.5–5.2)
ALK PHOS: 35 U/L — AB (ref 39–117)
ALT: 18 U/L (ref 0–35)
AST: 23 U/L (ref 0–37)
BILIRUBIN TOTAL: 0.7 mg/dL (ref 0.3–1.2)
Bilirubin, Direct: 0 mg/dL (ref 0.0–0.3)
Total Protein: 6.9 g/dL (ref 6.0–8.3)

## 2013-10-17 LAB — CBC WITH DIFFERENTIAL/PLATELET
BASOS ABS: 0.1 10*3/uL (ref 0.0–0.1)
Basophils Relative: 0.7 % (ref 0.0–3.0)
EOS ABS: 0.6 10*3/uL (ref 0.0–0.7)
Eosinophils Relative: 5.6 % — ABNORMAL HIGH (ref 0.0–5.0)
HCT: 43.9 % (ref 36.0–46.0)
Hemoglobin: 14 g/dL (ref 12.0–15.0)
LYMPHS PCT: 25.5 % (ref 12.0–46.0)
Lymphs Abs: 2.7 10*3/uL (ref 0.7–4.0)
MCHC: 32 g/dL (ref 30.0–36.0)
MCV: 97.9 fl (ref 78.0–100.0)
Monocytes Absolute: 0.6 10*3/uL (ref 0.1–1.0)
Monocytes Relative: 5.9 % (ref 3.0–12.0)
NEUTROS ABS: 6.5 10*3/uL (ref 1.4–7.7)
Neutrophils Relative %: 62.3 % (ref 43.0–77.0)
PLATELETS: 251 10*3/uL (ref 150.0–400.0)
RBC: 4.48 Mil/uL (ref 3.87–5.11)
RDW: 14.7 % — AB (ref 11.5–14.6)
WBC: 10.5 10*3/uL (ref 4.5–10.5)

## 2013-10-17 LAB — LIPID PANEL
CHOL/HDL RATIO: 4
Cholesterol: 222 mg/dL — ABNORMAL HIGH (ref 0–200)
HDL: 56.5 mg/dL (ref >39.00)
TRIGLYCERIDES: 136 mg/dL (ref 0.0–149.0)
VLDL: 27.2 mg/dL (ref 0.0–40.0)

## 2013-10-17 LAB — TSH: TSH: 1.79 u[IU]/mL (ref 0.35–5.50)

## 2013-10-17 LAB — LDL CHOLESTEROL, DIRECT: Direct LDL: 138.5 mg/dL

## 2013-10-18 ENCOUNTER — Ambulatory Visit (INDEPENDENT_AMBULATORY_CARE_PROVIDER_SITE_OTHER): Payer: Medicare Other | Admitting: Internal Medicine

## 2013-10-18 ENCOUNTER — Ambulatory Visit: Payer: Self-pay | Admitting: Internal Medicine

## 2013-10-18 ENCOUNTER — Encounter: Payer: Self-pay | Admitting: Internal Medicine

## 2013-10-18 VITALS — BP 126/84 | Temp 98.5°F | Ht 62.0 in | Wt 157.0 lb

## 2013-10-18 DIAGNOSIS — F17200 Nicotine dependence, unspecified, uncomplicated: Secondary | ICD-10-CM

## 2013-10-18 DIAGNOSIS — E785 Hyperlipidemia, unspecified: Secondary | ICD-10-CM

## 2013-10-18 DIAGNOSIS — Z0000 Encounter for general adult medical examination without abnormal findings: Secondary | ICD-10-CM

## 2013-10-18 DIAGNOSIS — E2839 Other primary ovarian failure: Secondary | ICD-10-CM

## 2013-10-18 DIAGNOSIS — Z23 Encounter for immunization: Secondary | ICD-10-CM

## 2013-10-18 DIAGNOSIS — I10 Essential (primary) hypertension: Secondary | ICD-10-CM

## 2013-10-18 MED ORDER — ATORVASTATIN CALCIUM 20 MG PO TABS: 20.0000 mg | ORAL_TABLET | Freq: Every day | ORAL | Status: DC

## 2013-10-18 MED ORDER — VARENICLINE TARTRATE 0.5 MG X 11 & 1 MG X 42 PO MISC: ORAL | Status: DC

## 2013-10-18 NOTE — Patient Instructions (Addendum)
.Continue lifestyle intervention healthy eating and exercise . Stop tobacco  150 minutes of exercise weeks  ,  Lose weight  To healthy levels. Avoid trans fats and processed foods;  Increase fresh fruits and veges to 5 servings per day. And avoid sweet beverages  Including tea and juice. Change crest or to Lipitor generic .  Recheck lipid panel in about 3 months after change ( no visit needed if doing ok )  chek into shingles vaccine reimbursement. Plan bone density dexa scan      Smoking Cessation, Tips for Success If you are ready to quit smoking, congratulations! You have chosen to help yourself be healthier. Cigarettes bring nicotine, tar, carbon monoxide, and other irritants into your body. Your lungs, heart, and blood vessels will be able to work better without these poisons. There are many different ways to quit smoking. Nicotine gum, nicotine patches, a nicotine inhaler, or nicotine nasal spray can help with physical craving. Hypnosis, support groups, and medicines help break the habit of smoking. WHAT THINGS CAN I DO TO MAKE QUITTING EASIER?  Here are some tips to help you quit for good:  Pick a date when you will quit smoking completely. Tell all of your friends and family about your plan to quit on that date.  Do not try to slowly cut down on the number of cigarettes you are smoking. Pick a quit date and quit smoking completely starting on that day.  Throw away all cigarettes.   Clean and remove all ashtrays from your home, work, and car.   On a card, write down your reasons for quitting. Carry the card with you and read it when you get the urge to smoke.   Cleanse your body of nicotine. Drink enough water and fluids to keep your urine clear or pale yellow. Do this after quitting to flush the nicotine from your body.   Learn to predict your moods. Do not let a bad situation be your excuse to have a cigarette. Some situations in your life might tempt you into wanting a  cigarette.   Never have "just one" cigarette. It leads to wanting another and another. Remind yourself of your decision to quit.   Change habits associated with smoking. If you smoked while driving or when feeling stressed, try other activities to replace smoking. Stand up when drinking your coffee. Brush your teeth after eating. Sit in a different chair when you read the paper. Avoid alcohol while trying to quit, and try to drink fewer caffeinated beverages. Alcohol and caffeine may urge you to smoke.   Avoid foods and drinks that can trigger a desire to smoke, such as sugary or spicy foods and alcohol.   Ask people who smoke not to smoke around you.   Have something planned to do right after eating or having a cup of coffee. For example, plan to take a walk or exercise.   Try a relaxation exercise to calm you down and decrease your stress. Remember, you may be tense and nervous for the first 2 weeks after you quit, but this will pass.   Find new activities to keep your hands busy. Play with a pen, coin, or rubber band. Doodle or draw things on paper.   Brush your teeth right after eating. This will help cut down on the craving for the taste of tobacco after meals. You can also try mouthwash.   Use oral substitutes in place of cigarettes. Try using lemon drops, carrots, cinnamon sticks, or  chewing gum. Keep them handy so they are available when you have the urge to smoke.   When you have the urge to smoke, try deep breathing.   Designate your home as a nonsmoking area.   If you are a heavy smoker, ask your health care provider about a prescription for nicotine chewing gum. It can ease your withdrawal from nicotine.   Reward yourself. Set aside the cigarette money you save and buy yourself something nice.   Look for support from others. Join a support group or smoking cessation program. Ask someone at home or at work to help you with your plan to quit smoking.   Always  ask yourself, "Do I need this cigarette or is this just a reflex?" Tell yourself, "Today, I choose not to smoke," or "I do not want to smoke." You are reminding yourself of your decision to quit.  Do not replace cigarette smoking with electronic cigarettes (commonly called e-cigarettes). The safety of e-cigarettes is unknown, and some may contain harmful chemicals.  If you relapse, do not give up! Plan ahead and think about what you will do the next time you get the urge to smoke.  HOW WILL I FEEL WHEN I QUIT SMOKING? You may have symptoms of withdrawal because your body is used to nicotine (the addictive substance in cigarettes). You may crave cigarettes, be irritable, feel very hungry, cough often, get headaches, or have difficulty concentrating. The withdrawal symptoms are only temporary. They are strongest when you first quit but will go away within 10 14 days. When withdrawal symptoms occur, stay in control. Think about your reasons for quitting. Remind yourself that these are signs that your body is healing and getting used to being without cigarettes. Remember that withdrawal symptoms are easier to treat than the major diseases that smoking can cause.  Even after the withdrawal is over, expect periodic urges to smoke. However, these cravings are generally short lived and will go away whether you smoke or not. Do not smoke!  WHAT RESOURCES ARE AVAILABLE TO HELP ME QUIT SMOKING? Your health care provider can direct you to community resources or hospitals for support, which may include:  Group support.  Education.  Hypnosis.  Therapy. Document Released: 05/30/2004 Document Revised: 06/22/2013 Document Reviewed: 02/17/2013 [facility] Patient Information 2014 [place], [place].

## 2013-10-18 NOTE — Progress Notes (Signed)
Chief Complaint  Patient presents with  . Medicare Wellness  . Hyperlipidemia  . Hypertension  . Nicotine Dependence    HPI: Patient comes in today for Preventive welcome to Medicare wellness visit . No , ed visits ,hospitalizations , new medications since last visit.   Had knee injury   From tennis left . Surgery  Dr Noemi Chapel .   [place] now.  6 months out.   smoking off an on.  chantix helps  But expensive .  went back after husband lost job and stress  crestor needs to [employer] to generic. Now on medicare  bp controlled   Health Maintenance  Topic Date Due  . Zostavax  10/03/2007  . Pneumococcal Polysaccharide Vaccine Age 5 And Over  10/02/2012  . Influenza Vaccine  04/15/2014  . Mammogram  05/27/2015  . Tetanus/tdap  09/15/2016  . Colonoscopy  09/22/2019   Health Maintenance Review  Hearing:  No hearing issue but bothers her  Itchy ears.   Vision:  No limitations at present . Last eye check UTD  Safety:  Has smoke detector and wears seat belts.  No firearms. No excess sun exposure. Sees dentist regularly.  Falls:  No  Except the knee injury playing tennis   Advance directive :  Reviewed  Living will   Memory: Felt to be good  , no concern from her or her family.  Depression: No anhedonia unusual crying or depressive symptoms  Nutrition: Eats well balanced diet; adequate calcium and vitamin D. No swallowing chewing problems.  Injury:   Knee injury  6 months and surgery.   Other healthcare providers:  Reviewed today .  Social:  Lives with spouse married. Dog   Preventive parameters: up-to-date  Reviewed   ADLS:   There are no problems or need for assistance  driving, feeding, obtaining food, dressing, toileting and bathing, managing money using phone. She is independent.  EXERCISE/ HABITS  Per week  Back to 1pp week.   tobacco    etoh no FAMILY:  Mom is 93  Live independent   ROS:  Gets leg swelling end of day hx of vv laser ablation right  GEN/ HEENT: No  fever, significant weight changes sweats headaches vision problems hearing changes, ears itch at times  No pain drainage  CV/ PULM; No chest pain  Stable shortness of breath cough, syncope,edema  change in exercise tolerance. GI /GU: No adominal pain, vomiting, change in bowel habits. No blood in the stool. No significant GU symptoms. SKIN/HEME: ,no acute skin rashes suspicious lesions or bleeding. No lymphadenopathy, nodules, masses.  NEURO/ PSYCH:  No neurologic signs such as weakness numbness. No depression anxiety. IMM/ Allergy: No unusual infections.  Allergy .   REST of 12 system review negative except as per HPI   Past Medical History  Diagnosis Date  . Hypertension   . Hyperlipidemia   . Depression   . Anxiety   . History of ulcerative colitis 2005    pancolitis   . Normal nuclear stress test 2008    myoview   . TOBACCO USER 07/02/2009    Qualifier: Diagnosis of  By: Regis Bill MD, Standley Brooking     Family History  Problem Relation Age of Onset  . Breast cancer Mother 59    breast  . Angina Mother   . Hypertension Mother   . Pancreatic cancer Father     deceased  . Hypertension Father     History   Social History  . Marital Status:  Married    Spouse Name: N/A    Number of Children: N/A  . Years of Education: N/A   Social History Main Topics  . Smoking status: Current Some Day Smoker  . Smokeless tobacco: Never Used  . Alcohol Use: Yes  . Drug Use: No  . Sexual Activity: None   Other Topics Concern  . None   Social History Narrative   Hh  Of 2    Pet dog    Off and on tobacco.   [employer].     Teacher    G4 P4    Husband had cva and blocked carotids     Outpatient Encounter Prescriptions as of 10/18/2013  Medication Sig  . CRESTOR 10 MG tablet TAKE ONE TABLET BY MOUTH EVERY DAY  . escitalopram (LEXAPRO) 10 MG tablet TAKE 1/2 TABLET TO ONE TABLET BY MOUTH EVERY DAY  . folic acid (FOLVITE) 1 MG tablet TAKE ONE TABLET BY MOUTH EVERY DAY  .  lisinopril-hydrochlorothiazide (PRINZIDE,ZESTORETIC) 20-12.5 MG per tablet TAKE ONE TABLET BY MOUTH EVERY DAY  . atorvastatin (LIPITOR) 20 MG tablet Take 1 tablet (20 mg total) by mouth daily.  . varenicline (CHANTIX STARTING MONTH PAK) 0.5 MG X 11 & 1 MG X 42 tablet Take one 0.5 mg tablet by mouth once daily for 3 days, then increase to one 0.5 mg tablet twice daily for 4 days, then increase to one 1 mg tablet twice daily.  . [DISCONTINUED] CHANTIX CONTINUING MONTH PAK 1 MG tablet TAKE AS DIRECTED  . [DISCONTINUED] rosuvastatin (CRESTOR) 10 MG tablet Take 10 mg by mouth daily.    EXAM:  BP 126/84  Temp(Src) 98.5 F (36.9 C) (Oral)  Ht 5' 2"  (1.575 m)  Wt 157 lb (71.215 kg)  BMI 28.71 kg/m2  Body mass index is 28.71 kg/(m^2).  Physical Exam: Vital signs reviewed [id number] is a well-developed well-nourished alert cooperative   who appears stated age in no acute distress.  HEENT: normocephalic atraumatic , Eyes: PERRL EOM's full, conjunctiva clear, Nares: paten,t no deformity discharge or tenderness., Ears: no deformity EAC's clear TMs with normal landmarks. Mouth: clear OP, no lesions, edema.  Moist mucous membranes. Dentition in adequate repair. NECK: supple without masses, thyromegaly or bruits. CHEST/PULM:  Clear to auscultation and percussion breath sounds equal no wheeze , rales or rhonchi. No chest wall deformities or tenderness. Breast: normal by inspection . No dimpling, discharge, masses, tenderness or discharge . CV: PMI is nondisplaced, S1 S2 no gallops, murmurs, rubs. Peripheral pulses are full without delay.No JVD .  ABDOMEN: Bowel sounds normal nontender  No guard or rebound, no hepato splenomegal no CVA tenderness.  No hernia. Extremtities:  No clubbing cyanosis or edema, no acute joint swelling or redness no focal atrophy vv mild edema  NEURO:  Oriented x3, cranial nerves 3-12 appear to be intact, no obvious focal weakness,gait within normal limits no abnormal reflexes or  asymmetrical SKIN: No acute rashes normal turgor, color, no bruising or petechiae. PSYCH: Oriented, good eye contact, no obvious depression anxiety, cognition and judgment appear normal. LN: no cervical axillary inguinal adenopathy No noted deficits in memory, attention, and speech.   Lab Results  Component Value Date   WBC 10.5 10/17/2013   HGB 14.0 10/17/2013   HCT 43.9 10/17/2013   PLT 251.0 10/17/2013   GLUCOSE 92 10/17/2013   CHOL 222* 10/17/2013   TRIG 136.0 10/17/2013   HDL 56.50 10/17/2013   LDLDIRECT 138.5 10/17/2013   LDLCALC 125* 12/01/2006  ALT 18 10/17/2013   AST 23 10/17/2013   NA 142 10/17/2013   K 5.0 10/17/2013   CL 107 10/17/2013   CREATININE 0.7 10/17/2013   BUN 21 10/17/2013   CO2 28 10/17/2013   TSH 1.79 10/17/2013    ASSESSMENT AND PLAN:  Discussed the following assessment and plan:  Welcome to Medicare preventive visit - prevnar 13 tday   Unspecified essential hypertension  Other and unspecified hyperlipidemia - cost change to generic lipitor  Estrogen deficiency - due for dexa call for appt - Plan: DG Bone Density  Need for vaccination with 13-polyvalent pneumococcal conjugate vaccine - Plan: Pneumococcal conjugate vaccine 13-valent  Tobacco use disorder - chantix counseling  riskreductionconsdier  pfts if sob progressive  Patient Care Team: Burnis Medin, MD as PCP - General  Patient Instructions  .Continue lifestyle intervention healthy eating and exercise . Stop tobacco  150 minutes of exercise weeks  ,  Lose weight  To healthy levels. Avoid trans fats and processed foods;  Increase fresh fruits and veges to 5 servings per day. And avoid sweet beverages  Including tea and juice. Change crest or to Lipitor generic .  Recheck lipid panel in about 3 months after change ( no visit needed if doing ok )  chek into shingles vaccine reimbursement. Plan bone density dexa scan      Smoking Cessation, Tips for Success If you are ready to quit smoking, congratulations! You  have chosen to help yourself be healthier. Cigarettes bring nicotine, tar, carbon monoxide, and other irritants into your body. Your lungs, heart, and blood vessels will be able to work better without these poisons. There are many different ways to quit smoking. Nicotine gum, nicotine patches, a nicotine inhaler, or nicotine nasal spray can help with physical craving. Hypnosis, support groups, and medicines help break the habit of smoking. WHAT THINGS CAN I DO TO MAKE QUITTING EASIER?  Here are some tips to help you quit for good:  Pick a date when you will quit smoking completely. Tell all of your friends and family about your plan to quit on that date.  Do not try to slowly cut down on the number of cigarettes you are smoking. Pick a quit date and quit smoking completely starting on that day.  Throw away all cigarettes.   Clean and remove all ashtrays from your home, work, and car.   On a card, write down your reasons for quitting. Carry the card with you and read it when you get the urge to smoke.   Cleanse your body of nicotine. Drink enough water and fluids to keep your urine clear or pale yellow. Do this after quitting to flush the nicotine from your body.   Learn to predict your moods. Do not let a bad situation be your excuse to have a cigarette. Some situations in your life might tempt you into wanting a cigarette.   Never have "just one" cigarette. It leads to wanting another and another. Remind yourself of your decision to quit.   Change habits associated with smoking. If you smoked while driving or when feeling stressed, try other activities to replace smoking. Stand up when drinking your coffee. Brush your teeth after eating. Sit in a different chair when you read the paper. Avoid alcohol while trying to quit, and try to drink fewer caffeinated beverages. Alcohol and caffeine may urge you to smoke.   Avoid foods and drinks that can trigger a desire to smoke, such as sugary  or spicy foods and alcohol.   Ask people who smoke not to smoke around you.   Have something planned to do right after eating or having a cup of coffee. For example, plan to take a walk or exercise.   Try a relaxation exercise to calm you down and decrease your stress. Remember, you may be tense and nervous for the first 2 weeks after you quit, but this will pass.   Find new activities to keep your hands busy. Play with a pen, coin, or rubber band. Doodle or draw things on paper.   Brush your teeth right after eating. This will help cut down on the craving for the taste of tobacco after meals. You can also try mouthwash.   Use oral substitutes in place of cigarettes. Try using lemon drops, carrots, cinnamon sticks, or chewing gum. Keep them handy so they are available when you have the urge to smoke.   When you have the urge to smoke, try deep breathing.   Designate your home as a nonsmoking area.   If you are a heavy smoker, ask your health care provider about a prescription for nicotine chewing gum. It can ease your withdrawal from nicotine.   Reward yourself. Set aside the cigarette money you save and buy yourself something nice.   Look for support from others. Join a support group or smoking cessation program. Ask someone at home or at work to help you with your plan to quit smoking.   Always ask yourself, "Do I need this cigarette or is this just a reflex?" Tell yourself, "Today, I choose not to smoke," or "I do not want to smoke." You are reminding yourself of your decision to quit.  Do not replace cigarette smoking with electronic cigarettes (commonly called e-cigarettes). The safety of e-cigarettes is unknown, and some may contain harmful chemicals.  If you relapse, do not give up! Plan ahead and think about what you will do the next time you get the urge to smoke.  HOW WILL I FEEL WHEN I QUIT SMOKING? You may have symptoms of withdrawal because your body is used to  nicotine (the addictive substance in cigarettes). You may crave cigarettes, be irritable, feel very hungry, cough often, get headaches, or have difficulty concentrating. The withdrawal symptoms are only temporary. They are strongest when you first quit but will go away within 10 14 days. When withdrawal symptoms occur, stay in control. Think about your reasons for quitting. Remind yourself that these are signs that your body is healing and getting used to being without cigarettes. Remember that withdrawal symptoms are easier to treat than the major diseases that smoking can cause.  Even after the withdrawal is over, expect periodic urges to smoke. However, these cravings are generally short lived and will go away whether you smoke or not. Do not smoke!  WHAT RESOURCES ARE AVAILABLE TO HELP ME QUIT SMOKING? Your health care provider can direct you to community resources or hospitals for support, which may include:  Group support.  Education.  Hypnosis.  Therapy. Document Released: 05/30/2004 Document Revised: 06/22/2013 Document Reviewed: 02/17/2013 [facility] Patient Information 2014 [place], [place].     Standley Brooking. Panosh M.D.    Pre visit review using our clinic review tool, if applicable. No additional management support is needed unless otherwise documented below in the visit note.

## 2013-11-12 ENCOUNTER — Other Ambulatory Visit: Payer: Self-pay | Admitting: Internal Medicine

## 2013-12-23 ENCOUNTER — Other Ambulatory Visit: Payer: Self-pay | Admitting: Internal Medicine

## 2013-12-28 ENCOUNTER — Telehealth: Payer: Self-pay | Admitting: Family Medicine

## 2013-12-28 NOTE — Telephone Encounter (Signed)
Per [facility], this patient should have a follow up visit in May.  Please make the appt with the pt.  Thanks!

## 2014-01-28 ENCOUNTER — Other Ambulatory Visit: Payer: Self-pay | Admitting: Internal Medicine

## 2014-05-09 ENCOUNTER — Other Ambulatory Visit: Payer: Self-pay | Admitting: Family Medicine

## 2014-06-28 LAB — HM MAMMOGRAPHY

## 2014-06-30 ENCOUNTER — Encounter: Payer: Self-pay | Admitting: Internal Medicine

## 2014-08-01 ENCOUNTER — Other Ambulatory Visit: Payer: Self-pay | Admitting: Internal Medicine

## 2014-08-01 NOTE — Telephone Encounter (Signed)
Denied.  Pt has requested a refill too soon.  Last filled Feb. 2015 for 1 year.

## 2014-09-13 ENCOUNTER — Telehealth: Payer: Self-pay | Admitting: *Deleted

## 2014-09-13 NOTE — Telephone Encounter (Signed)
[facility] [phone], [phone]) faxed a request for refills on: Atorvastatin, Lisinopril, and Escitalopram.

## 2014-09-14 MED ORDER — ATORVASTATIN CALCIUM 20 MG PO TABS: 20.0000 mg | ORAL_TABLET | Freq: Every day | ORAL | Status: DC

## 2014-09-14 MED ORDER — LISINOPRIL-HYDROCHLOROTHIAZIDE 20-12.5 MG PO TABS: 1.0000 | ORAL_TABLET | Freq: Every day | ORAL | Status: DC

## 2014-09-14 MED ORDER — ESCITALOPRAM OXALATE 10 MG PO TABS: ORAL_TABLET | ORAL | Status: DC

## 2014-09-14 NOTE — Telephone Encounter (Signed)
Medications have been sent to the pharmacy by e-scribe.  Pt is due for cpx in Feb.  Please help her to get on the schedule.

## 2014-09-18 NOTE — Telephone Encounter (Signed)
lmom for pt to cb

## 2014-09-28 NOTE — Telephone Encounter (Signed)
Pt has been scheduled for 2/17, cpe

## 2014-09-28 NOTE — Telephone Encounter (Signed)
lmom for pt to sch

## 2014-10-12 ENCOUNTER — Encounter: Payer: Self-pay | Admitting: Gastroenterology

## 2014-10-17 ENCOUNTER — Encounter: Payer: Self-pay | Admitting: Internal Medicine

## 2014-10-20 ENCOUNTER — Other Ambulatory Visit: Payer: Self-pay | Admitting: Internal Medicine

## 2014-10-20 NOTE — Telephone Encounter (Signed)
Sent to the pharmacy by e-scribe. 

## 2014-11-01 ENCOUNTER — Ambulatory Visit (INDEPENDENT_AMBULATORY_CARE_PROVIDER_SITE_OTHER): Payer: Medicare Other | Admitting: Internal Medicine

## 2014-11-01 ENCOUNTER — Encounter: Payer: Self-pay | Admitting: Internal Medicine

## 2014-11-01 VITALS — BP 112/68 | Temp 97.8°F | Ht 61.75 in | Wt 146.6 lb

## 2014-11-01 DIAGNOSIS — Z23 Encounter for immunization: Secondary | ICD-10-CM | POA: Diagnosis not present

## 2014-11-01 DIAGNOSIS — F17210 Nicotine dependence, cigarettes, uncomplicated: Secondary | ICD-10-CM | POA: Diagnosis not present

## 2014-11-01 DIAGNOSIS — Z720 Tobacco use: Secondary | ICD-10-CM

## 2014-11-01 DIAGNOSIS — Z0000 Encounter for general adult medical examination without abnormal findings: Secondary | ICD-10-CM

## 2014-11-01 DIAGNOSIS — I10 Essential (primary) hypertension: Secondary | ICD-10-CM

## 2014-11-01 DIAGNOSIS — E785 Hyperlipidemia, unspecified: Secondary | ICD-10-CM

## 2014-11-01 DIAGNOSIS — Z79899 Other long term (current) drug therapy: Secondary | ICD-10-CM | POA: Diagnosis not present

## 2014-11-01 DIAGNOSIS — F17200 Nicotine dependence, unspecified, uncomplicated: Secondary | ICD-10-CM

## 2014-11-01 LAB — BASIC METABOLIC PANEL
BUN: 19 mg/dL (ref 6–23)
CHLORIDE: 99 meq/L (ref 96–112)
CO2: 32 mEq/L (ref 19–32)
Calcium: 10.1 mg/dL (ref 8.4–10.5)
Creatinine, Ser: 0.72 mg/dL (ref 0.40–1.20)
GFR: 85.85 mL/min (ref >60.00)
GLUCOSE: 82 mg/dL (ref 70–99)
POTASSIUM: 4.3 meq/L (ref 3.5–5.1)
Sodium: 136 mEq/L (ref 135–145)

## 2014-11-01 LAB — CBC WITH DIFFERENTIAL/PLATELET
BASOS PCT: 0.4 % (ref 0.0–3.0)
Basophils Absolute: 0.1 10*3/uL (ref 0.0–0.1)
EOS ABS: 0.5 10*3/uL (ref 0.0–0.7)
Eosinophils Relative: 3.8 % (ref 0.0–5.0)
HCT: 43.1 % (ref 36.0–46.0)
HEMOGLOBIN: 14.5 g/dL (ref 12.0–15.0)
Lymphocytes Relative: 30.1 % (ref 12.0–46.0)
Lymphs Abs: 3.6 10*3/uL (ref 0.7–4.0)
MCHC: 33.7 g/dL (ref 30.0–36.0)
MCV: 93.5 fl (ref 78.0–100.0)
MONOS PCT: 6.5 % (ref 3.0–12.0)
Monocytes Absolute: 0.8 10*3/uL (ref 0.1–1.0)
NEUTROS ABS: 7.2 10*3/uL (ref 1.4–7.7)
NEUTROS PCT: 59.2 % (ref 43.0–77.0)
Platelets: 299 10*3/uL (ref 150.0–400.0)
RBC: 4.61 Mil/uL (ref 3.87–5.11)
RDW: 13.6 % (ref 11.5–15.5)
WBC: 12.1 10*3/uL — AB (ref 4.0–10.5)

## 2014-11-01 LAB — LIPID PANEL
CHOLESTEROL: 195 mg/dL (ref 0–200)
HDL: 53.1 mg/dL (ref >39.00)
LDL Cholesterol: 128 mg/dL — ABNORMAL HIGH (ref 0–99)
NONHDL: 141.9
TRIGLYCERIDES: 69 mg/dL (ref 0.0–149.0)
Total CHOL/HDL Ratio: 4
VLDL: 13.8 mg/dL (ref 0.0–40.0)

## 2014-11-01 LAB — HEPATIC FUNCTION PANEL
ALK PHOS: 44 U/L (ref 39–117)
ALT: 20 U/L (ref 0–35)
AST: 25 U/L (ref 0–37)
Albumin: 4.4 g/dL (ref 3.5–5.2)
BILIRUBIN DIRECT: 0.1 mg/dL (ref 0.0–0.3)
TOTAL PROTEIN: 7.5 g/dL (ref 6.0–8.3)
Total Bilirubin: 0.8 mg/dL (ref 0.2–1.2)

## 2014-11-01 LAB — TSH: TSH: 1.59 u[IU]/mL (ref 0.35–4.50)

## 2014-11-01 NOTE — Patient Instructions (Signed)
Yearly flu vaccine  Remain tobacco free !   Keep utd on your colonoscopy  Healthy lifestyle includes : At least 150 minutes of exercise weeks  , weight at healthy levels, which is usually   BMI 19-25. Avoid trans fats and processed foods;  Increase fresh fruits and veges to 5 servings per day. And avoid sweet beverages including tea and juice. Mediterranean diet with olive oil and nuts have been noted to be heart and brain healthy . Avoid tobacco products . Limit  alcohol to  7 per week for women and 14 servings for men.  Get adequate sleep . Wear seat belts . Don't text and drive .   Have pharmacy contact us for refills . If labs ok yearly  Wellness visit labs at that time

## 2014-11-01 NOTE — Progress Notes (Signed)
Pre visit review using our clinic review tool, if applicable. No additional management support is needed unless otherwise documented below in the visit note.  Chief Complaint  Patient presents with  . Medicare Wellness    HPI: Patient comes in today for Preventive Medicare wellness visit . And medical disease management  Tobacco used chantix but insurance wont pay and expensive   Mostly off tobacco but trying it on own off and on .  No major injuries, ed visits ,hospitalizations , new medications since last visit. LIPID:atorva  Doing ok: BP:  Good    Taking.   In teens asks about going okff no se  Mood: lexapro helpful  Mood stable  Still working at school with preschoolers  Bernardo Heater it  Active   Health Maintenance  Topic Date Due  . ZOSTAVAX  10/03/2007  . DEXA SCAN  10/02/2012  . INFLUENZA VACCINE  12/14/2014 (Originally 04/15/2014)  . MAMMOGRAM  06/28/2016  . TETANUS/TDAP  09/15/2016  . COLONOSCOPY  09/22/2019  . PNEUMOCOCCAL POLYSACCHARIDE VACCINE AGE 81 AND OVER  Completed   Health Maintenance Review LIFESTYLE:  Exercise:  ocass 2 x per week.  Tobacco/ETS: off and on  On chantix   Alcohol: per day less than one Sugar beverages: no  Doing weight watchers  Lost 4 # Sleep: 8 hours at least  Drug use: no Colonoscopy: due this year dr a brodie  SEE SCANNED DOCUMENT  Hearing:  ok  Vision:  No limitations at present . Last eye check UTD  Safety:  Has smoke detector and wears seat belts.  No firearms. No excess sun exposure. Sees dentist regularly.  Falls: tripped over purse no injury  Advance directive :  Reviewed  Has one.  Memory: Felt to be good  , no concern from her or her family.  Depression: No anhedonia unusual crying or depressive symptoms  Nutrition: Eats well balanced diet; adequate calcium and vitamin D. No swallowing chewing problems.  Injury: no major injuries in the last six months.  Other healthcare providers:  Reviewed today .  Social:  Lives with  spouse married. No pets.   Preventive parameters: up-to-date  Reviewed   ADLS:   There are no problems or need for assistance  driving, feeding, obtaining food, dressing, toileting and bathing, managing money using phone. She is independent.   ROS:  GEN/ HEENT: No fever, significant weight changes sweats headaches vision problems hearing changes, CV/ PULM; No chest pain shortness of breath cough, syncope,edema  change in exercise tolerance. GI /GU: No adominal pain, vomiting, change in bowel habits. No blood in the stool. No significant GU symptoms. SKIN/HEME: ,no acute skin rashes suspicious lesions or bleeding. No lymphadenopathy, nodules, masses.  NEURO/ PSYCH:  No neurologic signs such as weakness numbness. No depression anxiety. IMM/ Allergy: No unusual infections.  Allergy .   REST of 12 system review negative except as per HPI   Past Medical History  Diagnosis Date  . Hypertension   . Hyperlipidemia   . Depression   . Anxiety   . History of ulcerative colitis 2005    pancolitis   . Normal nuclear stress test 2008    myoview   . TOBACCO USER 07/02/2009    Qualifier: Diagnosis of  By: Regis Bill MD, Standley Brooking     Family History  Problem Relation Age of Onset  . Breast cancer Mother 34    breast  . Angina Mother   . Hypertension Mother   . Pancreatic cancer Father  deceased  . Hypertension Father     History   Social History  . Marital Status: Married    Spouse Name: N/A  . Number of Children: N/A  . Years of Education: N/A   Social History Main Topics  . Smoking status: Current Some Day Smoker  . Smokeless tobacco: Never Used  . Alcohol Use: Yes  . Drug Use: No  . Sexual Activity: Not on file   Other Topics Concern  . None   Social History Narrative   Hh  Of 2    Pet dog    Off and on tobacco.   [employer].     Teacher runs school.   76 - 3    G4 P4    Husband had cva and blocked carotids     Outpatient Encounter Prescriptions as of  11/01/2014  Medication Sig  . atorvastatin (LIPITOR) 20 MG tablet Take 1 tablet by mouth  daily  . escitalopram (LEXAPRO) 10 MG tablet Take 1/2 to 1 tablet by  mouth every day  . lisinopril-hydrochlorothiazide (PRINZIDE,ZESTORETIC) 20-12.5 MG per tablet Take 1 tablet by mouth daily.  . [DISCONTINUED] CRESTOR 10 MG tablet TAKE ONE TABLET BY MOUTH EVERY DAY  . [DISCONTINUED] varenicline (CHANTIX STARTING MONTH PAK) 0.5 MG X 11 & 1 MG X 42 tablet Take one 0.5 mg tablet by mouth once daily for 3 days, then increase to one 0.5 mg tablet twice daily for 4 days, then increase to one 1 mg tablet twice daily.  . [DISCONTINUED] folic acid (FOLVITE) 1 MG tablet TAKE ONE TABLET BY MOUTH EVERY DAY    EXAM:  BP 112/68 mmHg  Temp(Src) 97.8 F (36.6 C) (Oral)  Ht 5' 1.75" (1.568 m)  Wt 146 lb 9.6 oz (66.497 kg)  BMI 27.05 kg/m2  Body mass index is 27.05 kg/(m^2).  Physical Exam: Vital signs reviewed [id number] is a well-developed well-nourished alert cooperative   who appears stated age in no acute distress.  HEENT: normocephalic atraumatic , Eyes: PERRL EOM's full, conjunctiva clear, Nares: paten,t no deformity discharge or tenderness., Ears: no deformity EAC's clear TMs with normal landmarks. Mouth: clear OP, no lesions, edema.  Moist mucous membranes. Dentition in adequate repair. NECK: supple without masses, thyromegaly or bruits. CHEST/PULM:  Clear to auscultation and percussion breath sounds equal no wheeze , rales or rhonchi. No chest wall deformities or tenderness. CV: PMI is nondisplaced, S1 S2 no gallops, murmurs, rubs. Peripheral pulses are full without delay.No JVD .  Breast: normal by inspection . No dimpling, discharge, masses, tenderness or discharge . ABDOMEN: Bowel sounds normal nontender  No guard or rebound, no hepato splenomegal no CVA tenderness.  No hernia. Extremtities:  No clubbing cyanosis or edema, no acute joint swelling or redness no focal atrophy NEURO:  Oriented x3, cranial  nerves 3-12 appear to be intact, no obvious focal weakness,gait within normal limits no abnormal reflexes or asymmetrical SKIN: No acute rashes normal turgor, color, no bruising or petechiae. PSYCH: Oriented, good eye contact, no obvious depression anxiety, cognition and judgment appear normal. LN: no cervical axillary inguinal adenopathy No noted deficits in memory, attention, and speech. Labs due to be done today  Late fasting!   BP Readings from Last 3 Encounters:  11/01/14 112/68  10/18/13 126/84  07/05/12 142/82    ASSESSMENT AND PLAN:  Discussed the following assessment and plan:  Medicare annual wellness visit, initial  Visit for preventive health examination  Tobacco use disorder stop 2015 off an on  -  stopped tobacco continue insurance dosen pay for tobacco cessation chantix whiich has helped   Hyperlipidemia - Plan: Basic metabolic panel, Hepatic function panel, Lipid panel, TSH, CBC with Differential/Platelet  Medication management - Plan: Basic metabolic panel, Hepatic function panel, Lipid panel, TSH, CBC with Differential/Platelet  Essential hypertension - Plan: Basic metabolic panel, Hepatic function panel, Lipid panel, TSH, CBC with Differential/Platelet  Need for 23-polyvalent pneumococcal polysaccharide vaccine - Plan: Pneumococcal polysaccharide vaccine 23-valent greater than or equal to 2yo subcutaneous/IM l disc above disease management  At this time stay on antihypertensive meds  Good control no obv se .  Counseled. Tobacco cessation  5 minutes   Pt is mostly there  contact us if needs another rx chantix Patient Care Team: Burnis Medin, MD as PCP - General  Patient Instructions  Yearly flu vaccine  Remain tobacco free !   Keep utd on your colonoscopy  Healthy lifestyle includes : At least 150 minutes of exercise weeks  , weight at healthy levels, which is usually   BMI 19-25. Avoid trans fats and processed foods;  Increase fresh fruits and veges to  5 servings per day. And avoid sweet beverages including tea and juice. Mediterranean diet with olive oil and nuts have been noted to be heart and brain healthy . Avoid tobacco products . Limit  alcohol to  7 per week for women and 14 servings for men.  Get adequate sleep . Wear seat belts . Don't text and drive .   Have pharmacy contact us for refills . If labs ok yearly  Wellness visit labs at that time     Standley Brooking. Lilliahna Schubring M.D.  Lab Results  Component Value Date   WBC 12.1* 11/01/2014   HGB 14.5 11/01/2014   HCT 43.1 11/01/2014   PLT 299.0 11/01/2014   GLUCOSE 82 11/01/2014   CHOL 195 11/01/2014   TRIG 69.0 11/01/2014   HDL 53.10 11/01/2014   LDLDIRECT 138.5 10/17/2013   LDLCALC 128* 11/01/2014   ALT 20 11/01/2014   AST 25 11/01/2014   NA 136 11/01/2014   K 4.3 11/01/2014   CL 99 11/01/2014   CREATININE 0.72 11/01/2014   BUN 19 11/01/2014   CO2 32 11/01/2014   TSH 1.59 11/01/2014

## 2014-11-02 ENCOUNTER — Telehealth: Payer: Self-pay | Admitting: Internal Medicine

## 2014-11-02 NOTE — Telephone Encounter (Signed)
emmi emailed °

## 2014-11-03 ENCOUNTER — Telehealth: Payer: Self-pay | Admitting: Internal Medicine

## 2014-11-03 NOTE — Telephone Encounter (Signed)
emmi mailed  °

## 2014-11-07 ENCOUNTER — Other Ambulatory Visit: Payer: Self-pay | Admitting: Internal Medicine

## 2014-11-08 NOTE — Telephone Encounter (Signed)
Sent to the pharmacy by e-scribe. 

## 2014-11-10 ENCOUNTER — Telehealth: Payer: Self-pay | Admitting: Internal Medicine

## 2014-11-10 NOTE — Telephone Encounter (Signed)
Pt notified of results by telephone.  See result note.

## 2014-11-10 NOTE — Telephone Encounter (Signed)
Pt returning your call about labs. pls call on home phone

## 2014-12-21 DIAGNOSIS — M25512 Pain in left shoulder: Secondary | ICD-10-CM | POA: Diagnosis not present

## 2014-12-21 DIAGNOSIS — M542 Cervicalgia: Secondary | ICD-10-CM | POA: Diagnosis not present

## 2015-02-13 ENCOUNTER — Encounter: Payer: Self-pay | Admitting: Internal Medicine

## 2015-02-20 ENCOUNTER — Other Ambulatory Visit: Payer: Self-pay | Admitting: Internal Medicine

## 2015-02-21 NOTE — Telephone Encounter (Signed)
Ok to rx sent in

## 2015-03-15 ENCOUNTER — Encounter: Payer: Self-pay | Admitting: Internal Medicine

## 2015-04-04 ENCOUNTER — Ambulatory Visit (AMBULATORY_SURGERY_CENTER): Payer: Self-pay | Admitting: *Deleted

## 2015-04-04 VITALS — Ht 63.0 in | Wt 150.0 lb

## 2015-04-04 DIAGNOSIS — K5190 Ulcerative colitis, unspecified, without complications: Secondary | ICD-10-CM

## 2015-04-04 MED ORDER — NA SULFATE-K SULFATE-MG SULF 17.5-3.13-1.6 GM/177ML PO SOLN: 1.0000 | Freq: Once | ORAL | Status: DC

## 2015-04-04 NOTE — Progress Notes (Signed)
No egg or soy allergy. No anesthesia problems.  No home O2.  No diet meds.

## 2015-04-11 ENCOUNTER — Encounter: Payer: Self-pay | Admitting: Gastroenterology

## 2015-04-18 ENCOUNTER — Encounter: Payer: Medicare Other | Admitting: Internal Medicine

## 2015-04-18 ENCOUNTER — Telehealth: Payer: Self-pay | Admitting: Internal Medicine

## 2015-04-18 NOTE — Telephone Encounter (Signed)
Pt will have to be seen to get antibiotics.  Please help her to make an appointment

## 2015-04-18 NOTE — Telephone Encounter (Signed)
S/w pt told her what the outcome was and she said ok. Did not want to make an appt .

## 2015-04-18 NOTE — Telephone Encounter (Signed)
Pt said she has a slight toothache and  she can not see her dentist until the end of August. Pt and she is going to see her Mom and is asking if Dr Regis Bill will call her in a antibiotic     Ph[facility]

## 2015-05-02 ENCOUNTER — Other Ambulatory Visit: Payer: Self-pay | Admitting: Internal Medicine

## 2015-05-03 MED ORDER — VARENICLINE TARTRATE 1 MG PO TABS: 1.0000 mg | ORAL_TABLET | Freq: Two times a day (BID) | ORAL | Status: DC

## 2015-05-03 NOTE — Telephone Encounter (Signed)
Ok to refill  Chantix   Ask if she needs starter pack or maintenance pack

## 2015-05-03 NOTE — Telephone Encounter (Signed)
Sent it in

## 2015-05-03 NOTE — Telephone Encounter (Signed)
Pt will need maintenance pack.  Please advise.

## 2015-07-05 ENCOUNTER — Ambulatory Visit (INDEPENDENT_AMBULATORY_CARE_PROVIDER_SITE_OTHER): Payer: Medicare Other | Admitting: Gastroenterology

## 2015-07-05 ENCOUNTER — Encounter: Payer: Self-pay | Admitting: Gastroenterology

## 2015-07-05 VITALS — BP 110/70 | HR 100 | Ht 62.0 in | Wt 155.6 lb

## 2015-07-05 DIAGNOSIS — K5180 Other ulcerative colitis without complications: Secondary | ICD-10-CM | POA: Diagnosis not present

## 2015-07-05 MED ORDER — MESALAMINE 1.2 G PO TBEC: 1.2000 g | DELAYED_RELEASE_TABLET | Freq: Every day | ORAL | Status: DC

## 2015-07-05 NOTE — Progress Notes (Signed)
         Shelley Wright    [id number]    04/18/1946  Primary Care Physician:PANOSH,WANDA KOTVAN, MD  Referring Physician: Wanda K Panosh, MD [address] , [place] [address]  Chief complaint:  Ulcerative colitis  HPI:  69-year-old female with history of ulcerative colitis initially diagnosed in 2001, last flare in 2007 is here for follow-up. She feels well overall denies diarrhea or blood in stool. Her last colonoscopy was in 2011 showed pseudopolyps throughout colon with no inflammation. Denies any nausea, vomiting or weight loss. She is currently not on any maintenance therapy, she stopped taking Asacol few years ago.   Outpatient Encounter Prescriptions as of 07/05/2015  Medication Sig  . atorvastatin (LIPITOR) 20 MG tablet Take 1 tablet by mouth  daily  . escitalopram (LEXAPRO) 10 MG tablet Take 1/2 to 1 tablet by  mouth every day  . lisinopril-hydrochlorothiazide (PRINZIDE,ZESTORETIC) 20-12.5 MG per tablet Take 1 tablet by mouth  daily  . meloxicam (MOBIC) 15 MG tablet TAKE 1 TABLET BY MOUTH EVERY DAY WITH FOOD AS NEEDED FOR PAIN OR swelling  . Na Sulfate-K Sulfate-Mg Sulf (SUPREP BOWEL PREP) SOLN Take 1 kit by mouth once. Name brand only, suprep as directed, no substitutions  . mesalamine (LIALDA) 1.2 G EC tablet Take 1 tablet (1.2 g total) by mouth daily with breakfast.  . [DISCONTINUED] varenicline (CHANTIX CONTINUING MONTH PAK) 1 MG tablet Take 1 tablet (1 mg total) by mouth 2 (two) times daily.   No facility-administered encounter medications on file as of 07/05/2015.    Allergies as of 07/05/2015  . (No Known Allergies)    Past Medical History  Diagnosis Date  . Hypertension   . Hyperlipidemia   . Depression   . Anxiety   . History of ulcerative colitis 2005    pancolitis   . Normal nuclear stress test 2008    myoview   . TOBACCO USER 07/02/2009    Qualifier: Diagnosis of  By: Panosh MD, Wanda K     Past Surgical History  Procedure  Laterality Date  . Breast biopsy  1986    biopsy  . Knee surgery   october     left  . Colonoscopy      Family History  Problem Relation Age of Onset  . Breast cancer Mother 87    breast  . Angina Mother   . Hypertension Mother   . Pancreatic cancer Father     deceased  . Hypertension Father   . Colon cancer Neg Hx     Social History   Social History  . Marital Status: Married    Spouse Name: N/A  . Number of Children: 4  . Years of Education: N/A   Occupational History  . Not on file.   Social History Main Topics  . Smoking status: Former Smoker    Quit date: 07/04/2014  . Smokeless tobacco: Never Used  . Alcohol Use: 0.0 oz/week    0 Standard drinks or equivalent per week  . Drug Use: No  . Sexual Activity: Not on file   Other Topics Concern  . Not on file   Social History Narrative   Hh  Of 2    Pet dog    Off and on tobacco.   [employer].     Teacher runs school.   8 - 3    G4 P4    Husband had cva and blocked carotids         Review of systems: Review of Systems  Constitutional: Negative for fever and chills.  HENT: Negative.   Eyes: Negative for blurred vision.  Respiratory: Negative for cough, shortness of breath and wheezing.   Cardiovascular: Negative for chest pain and palpitations.  Gastrointestinal: as per HPI Genitourinary: Negative for dysuria, urgency, frequency and hematuria.  Musculoskeletal: Negative for myalgias, back pain and joint pain.  Skin: Negative for itching and rash.  Neurological: Negative for dizziness, tremors, focal weakness, seizures and loss of consciousness.  Endo/Heme/Allergies: Negative for environmental allergies.  Psychiatric/Behavioral: Negative for depression, suicidal ideas and hallucinations.  All other systems reviewed and are negative.   Physical Exam: Filed Vitals:   07/05/15 1334  BP: 110/70  Pulse: 100   Gen:      No acute distress HEENT:  EOMI, sclera anicteric Neck:     No masses; no  thyromegaly Lungs:    Clear to auscultation bilaterally; normal respiratory effort CV:         Regular rate and rhythm; no murmurs Abd:      + bowel sounds; soft, non-tender; no palpable masses, no distension Ext:    trace edema; adequate peripheral perfusion Skin:      Warm and dry; no rash Neuro: alert and oriented x 3 Psych: normal mood and affect  Data Reviewed:  Colonoscopy 09/21/2009 Pseudopolyps were noted throughout the colon ranging in size 5-10 mm. No active inflammation. Terminal ileum appeared normal. Biopsy on pathology consistent with pseudopolyps, no active inflammation, no dysplasia or adenoma  Rewied recent labs from Feb 2016  Assessment and Plan/Recommendations 69 year old female with history of ulcerative colitis initially diagnosed in 2001 is here for follow-up. Her disease is currently in remission, she is not on any maintenance therapy, discussed with patient regarding starting Lialda as mesalamine has been shown to decrease the risk off dysplasia She is due for surveillance colonoscopy also discussed with patient that she is high risk and will need to get colonoscopies every 2 years for surveillance for colorectal cancer We'll schedule for colonoscopy during this visit Return in 6 months  K. Denzil Magnuson , MD [phone] Mon-Fri 8a-5p [phone] after 5p, weekends, holidays

## 2015-07-05 NOTE — Patient Instructions (Signed)

## 2015-07-16 ENCOUNTER — Telehealth: Payer: Self-pay | Admitting: Gastroenterology

## 2015-07-16 ENCOUNTER — Other Ambulatory Visit: Payer: Self-pay

## 2015-07-16 NOTE — Telephone Encounter (Signed)
New instructions mailed to the patient. She is aware and understands she can call if she is unsure of something.

## 2015-07-19 ENCOUNTER — Encounter: Payer: Medicare Other | Admitting: Gastroenterology

## 2015-08-24 ENCOUNTER — Other Ambulatory Visit: Payer: Self-pay | Admitting: Internal Medicine

## 2015-08-27 DIAGNOSIS — H2513 Age-related nuclear cataract, bilateral: Secondary | ICD-10-CM | POA: Diagnosis not present

## 2015-08-28 ENCOUNTER — Telehealth: Payer: Self-pay | Admitting: Family Medicine

## 2015-08-28 DIAGNOSIS — Z803 Family history of malignant neoplasm of breast: Secondary | ICD-10-CM | POA: Diagnosis not present

## 2015-08-28 DIAGNOSIS — Z1231 Encounter for screening mammogram for malignant neoplasm of breast: Secondary | ICD-10-CM | POA: Diagnosis not present

## 2015-08-28 LAB — HM MAMMOGRAPHY

## 2015-08-28 NOTE — Telephone Encounter (Signed)
lmom for pt to call back

## 2015-08-28 NOTE — Telephone Encounter (Signed)
Lisinopril sent for 90 days.  Lexapro filled for 1 year in Feb 16.  Lexapro request denied as it is too early.

## 2015-08-28 NOTE — Telephone Encounter (Signed)
Pt is due for medicare wellness exam in Feb 2017.  Please help her to make fasting appointment.  Thanks!

## 2015-08-29 ENCOUNTER — Encounter: Payer: Self-pay | Admitting: Family Medicine

## 2015-08-31 NOTE — Telephone Encounter (Signed)
lmom for pt to call back

## 2015-09-03 NOTE — Telephone Encounter (Signed)
lmom for pt to call back

## 2015-09-04 ENCOUNTER — Encounter: Payer: Self-pay | Admitting: Family Medicine

## 2015-09-04 NOTE — Telephone Encounter (Signed)
Letter sent by mail.  Multiple attempts to reach the pt.

## 2015-09-21 ENCOUNTER — Encounter: Payer: Self-pay | Admitting: Gastroenterology

## 2015-09-21 ENCOUNTER — Ambulatory Visit (AMBULATORY_SURGERY_CENTER): Payer: Medicare Other | Admitting: Gastroenterology

## 2015-09-21 VITALS — BP 101/65 | HR 84 | Temp 98.7°F | Resp 15 | Ht 62.0 in | Wt 155.0 lb

## 2015-09-21 DIAGNOSIS — Z1211 Encounter for screening for malignant neoplasm of colon: Secondary | ICD-10-CM

## 2015-09-21 DIAGNOSIS — K5180 Other ulcerative colitis without complications: Secondary | ICD-10-CM

## 2015-09-21 DIAGNOSIS — D122 Benign neoplasm of ascending colon: Secondary | ICD-10-CM | POA: Diagnosis not present

## 2015-09-21 DIAGNOSIS — K5100 Ulcerative (chronic) pancolitis without complications: Secondary | ICD-10-CM

## 2015-09-21 DIAGNOSIS — K5190 Ulcerative colitis, unspecified, without complications: Secondary | ICD-10-CM | POA: Diagnosis not present

## 2015-09-21 MED ORDER — SODIUM CHLORIDE 0.9 % IV SOLN: 500.0000 mL | INTRAVENOUS | Status: DC

## 2015-09-21 NOTE — Progress Notes (Signed)
Called to room to assist during endoscopic procedure.  Patient ID and intended procedure confirmed with present staff. Received instructions for my participation in the procedure from the performing physician.  

## 2015-09-21 NOTE — Patient Instructions (Signed)
YOU HAD AN ENDOSCOPIC PROCEDURE TODAY AT [facility]:   Refer to the procedure report that was given to you for any specific questions about what was found during the examination.  If the procedure report does not answer your questions, please call your gastroenterologist to clarify.  If you requested that your care partner not be given the details of your procedure findings, then the procedure report has been included in a sealed envelope for you to review at your convenience later.  YOU SHOULD EXPECT: Some feelings of bloating in the abdomen. Passage of more gas than usual.  Walking can help get rid of the air that was put into your GI tract during the procedure and reduce the bloating. If you had a lower endoscopy (such as a colonoscopy or flexible sigmoidoscopy) you may notice spotting of blood in your stool or on the toilet paper. If you underwent a bowel prep for your procedure, you may not have a normal bowel movement for a few days.  Please Note:  You might notice some irritation and congestion in your nose or some drainage.  This is from the oxygen used during your procedure.  There is no need for concern and it should clear up in a day or so.  SYMPTOMS TO REPORT IMMEDIATELY:   Following lower endoscopy (colonoscopy or flexible sigmoidoscopy):  Excessive amounts of blood in the stool  Significant tenderness or worsening of abdominal pains  Swelling of the abdomen that is new, acute  Fever of 100F or higher   For urgent or emergent issues, a gastroenterologist can be reached at any hour by calling [phone].   DIET: Your first meal following the procedure should be a small meal and then it is ok to progress to your normal diet. Heavy or fried foods are harder to digest and may make you feel nauseous or bloated.  Likewise, meals heavy in dairy and vegetables can increase bloating.  Drink plenty of fluids but you should avoid alcoholic beverages for 24  hours.  ACTIVITY:  You should plan to take it easy for the rest of today and you should NOT DRIVE or use heavy machinery until tomorrow (because of the sedation medicines used during the test).    FOLLOW UP: Our staff will call the number listed on your records the next business day following your procedure to check on you and address any questions or concerns that you may have regarding the information given to you following your procedure. If we do not reach you, we will leave a message.  However, if you are feeling well and you are not experiencing any problems, there is no need to return our call.  We will assume that you have returned to your regular daily activities without incident.  If any biopsies were taken you will be contacted by phone or by letter within the next 1-3 weeks.  Please call us at [phone] if you have not heard about the biopsies in 3 weeks.    SIGNATURES/CONFIDENTIALITY: You and/or your care partner have signed paperwork which will be entered into your electronic medical record.  These signatures attest to the fact that that the information above on your After Visit Summary has been reviewed and is understood.  Full responsibility of the confidentiality of this discharge information lies with you and/or your care-partner.   Handouts were given to your care partner on ulcerative colitis and polyps. You may resume your current medications today. Await biopsy results. Please  call if any questions or concerns.

## 2015-09-21 NOTE — Op Note (Signed)
[facility]  [address] [place], [id number]   COLONOSCOPY PROCEDURE REPORT  PATIENT: [name], [name]  MR#: [id number] BIRTHDATE: [birth year] , 59  yrs. old GENDER: female ENDOSCOPIST: Harl Bowie, MD REFERRED SF:KCLEX Darnelle Going, M.D. PROCEDURE DATE:  09/21/2015 PROCEDURE:   Colonoscopy, surveillance , Colonoscopy with biopsy, and Colonoscopy with snare polypectomy First Screening Colonoscopy - Avg.  risk and is 50 yrs.  old or older - No.  Prior Negative Screening - Now for repeat screening. N/A  History of Adenoma - Now for follow-up colonoscopy & has been > or = to 3 yrs.  N/A  Polyps removed today? Yes ASA CLASS:   Class II INDICATIONS:Inflammatory bowel disease of the intestine if more precise diagnosis or determination of the extent / severity of activity of disease will influence immediate / future management and Inflammatory Bowel Disease (at least 8 years pancolitis or 15 years left sided colitis). MEDICATIONS: Propofol 250 mg IV  DESCRIPTION OF PROCEDURE:   After the risks benefits and alternatives of the procedure were thoroughly explained, informed consent was obtained.  The digital rectal exam revealed no abnormalities of the rectum.   The LB [id number]  endoscope was introduced through the anus and advanced to the cecum, which was identified by both the appendix and ileocecal valve. No adverse events experienced.   The quality of the prep was good.  The instrument was then slowly withdrawn as the colon was fully examined. Estimated blood loss is zero unless otherwise noted in this procedure report.      COLON FINDINGS: Decreased vascularity with erythema and pseudopolyps throughout the colon, random 4 quadrant biopsies were obtained from every 10 cm of the colon.  4-5 mm sessile polyp from ascending colon was removed with cold snare.  Retroflexed views revealed no abnormalities. The time to cecum = 7.8 Withdrawal time = 16.7    The scope was withdrawn and the procedure completed. COMPLICATIONS: There were no immediate complications.  ENDOSCOPIC IMPRESSION: Decreased vascularity with erythema and pseudopolyps throughout the colon, random 4 quadrant biopsies were obtained from every 10 cm of the colon.  4-5 mm sessile polyp from ascending colon was removed with cold snare  RECOMMENDATIONS: Await pathology results repeat colonoscopy in 1 year for surveillance  eSigned:  Harl Bowie, MD 09/21/2015 2:47 PM      PATIENT NAME:  Shelley Wright, Shelley Wright MR#: [id number]

## 2015-09-21 NOTE — Progress Notes (Signed)
Report to PACU, RN, vss, BBS= Clear.  

## 2015-09-25 ENCOUNTER — Telehealth: Payer: Self-pay

## 2015-09-25 NOTE — Telephone Encounter (Signed)
  Follow up Call-  Call back number 09/21/2015  Post procedure Call Back phone  # [phone]  Permission to leave phone message Yes     Patient was called for follow up after procedure on 09/21/2015. No answer at the number given. A message was left on the answering machine.

## 2015-10-08 ENCOUNTER — Encounter: Payer: Self-pay | Admitting: Gastroenterology

## 2015-10-25 ENCOUNTER — Other Ambulatory Visit: Payer: Self-pay | Admitting: Internal Medicine

## 2015-10-26 ENCOUNTER — Other Ambulatory Visit: Payer: Self-pay | Admitting: Family Medicine

## 2015-10-26 ENCOUNTER — Telehealth: Payer: Self-pay | Admitting: Family Medicine

## 2015-10-26 DIAGNOSIS — Z0000 Encounter for general adult medical examination without abnormal findings: Secondary | ICD-10-CM

## 2015-10-26 NOTE — Telephone Encounter (Signed)
Pt is now due for cpx and fasting lab work.  Please help the pt to make both appointments.  I have placed the lab orders.  Thanks!

## 2015-10-26 NOTE — Telephone Encounter (Signed)
Pt has been schedule.

## 2015-10-26 NOTE — Telephone Encounter (Signed)
Sent to the pharmacy by e-scribe.  Pt now due for cpx and lab work.  Message sent to scheduling.

## 2015-12-13 ENCOUNTER — Other Ambulatory Visit: Payer: Self-pay | Admitting: Internal Medicine

## 2015-12-13 NOTE — Telephone Encounter (Signed)
Sent to the pharmacy by e-scribe.  Pt has upcoming appt on 01/16/16 with [facility]

## 2015-12-14 ENCOUNTER — Other Ambulatory Visit: Payer: Self-pay | Admitting: Internal Medicine

## 2015-12-14 NOTE — Telephone Encounter (Signed)
Sent to the pharmacy by e-scribe. 

## 2015-12-22 DIAGNOSIS — J101 Influenza due to other identified influenza virus with other respiratory manifestations: Secondary | ICD-10-CM | POA: Diagnosis not present

## 2015-12-31 ENCOUNTER — Telehealth: Payer: Self-pay | Admitting: Internal Medicine

## 2015-12-31 NOTE — Telephone Encounter (Signed)
Error/ltd ° °

## 2016-01-16 ENCOUNTER — Encounter: Payer: Medicare Other | Admitting: Internal Medicine

## 2016-02-13 DIAGNOSIS — L821 Other seborrheic keratosis: Secondary | ICD-10-CM | POA: Diagnosis not present

## 2016-02-13 DIAGNOSIS — L718 Other rosacea: Secondary | ICD-10-CM | POA: Diagnosis not present

## 2016-02-18 ENCOUNTER — Other Ambulatory Visit: Payer: Self-pay | Admitting: Internal Medicine

## 2016-02-19 ENCOUNTER — Other Ambulatory Visit: Payer: Self-pay | Admitting: General Practice

## 2016-02-19 MED ORDER — ESCITALOPRAM OXALATE 10 MG PO TABS: ORAL_TABLET | ORAL | Status: DC

## 2016-02-19 MED ORDER — ATORVASTATIN CALCIUM 20 MG PO TABS: ORAL_TABLET | ORAL | Status: DC

## 2016-02-19 MED ORDER — LISINOPRIL-HYDROCHLOROTHIAZIDE 20-12.5 MG PO TABS: ORAL_TABLET | ORAL | Status: DC

## 2016-03-20 ENCOUNTER — Other Ambulatory Visit (INDEPENDENT_AMBULATORY_CARE_PROVIDER_SITE_OTHER): Payer: Medicare Other

## 2016-03-20 DIAGNOSIS — Z0000 Encounter for general adult medical examination without abnormal findings: Secondary | ICD-10-CM | POA: Diagnosis not present

## 2016-03-20 LAB — HEPATIC FUNCTION PANEL
ALBUMIN: 4.2 g/dL (ref 3.5–5.2)
ALT: 16 U/L (ref 0–35)
AST: 20 U/L (ref 0–37)
Alkaline Phosphatase: 44 U/L (ref 39–117)
BILIRUBIN TOTAL: 0.8 mg/dL (ref 0.2–1.2)
Bilirubin, Direct: 0.1 mg/dL (ref 0.0–0.3)
TOTAL PROTEIN: 6.7 g/dL (ref 6.0–8.3)

## 2016-03-20 LAB — CBC WITH DIFFERENTIAL/PLATELET
BASOS PCT: 0.6 % (ref 0.0–3.0)
Basophils Absolute: 0.1 10*3/uL (ref 0.0–0.1)
EOS ABS: 0.7 10*3/uL (ref 0.0–0.7)
EOS PCT: 7.2 % — AB (ref 0.0–5.0)
HEMATOCRIT: 39.6 % (ref 36.0–46.0)
HEMOGLOBIN: 13.2 g/dL (ref 12.0–15.0)
LYMPHS PCT: 25.5 % (ref 12.0–46.0)
Lymphs Abs: 2.5 10*3/uL (ref 0.7–4.0)
MCHC: 33.3 g/dL (ref 30.0–36.0)
MCV: 93.6 fl (ref 78.0–100.0)
Monocytes Absolute: 0.6 10*3/uL (ref 0.1–1.0)
Monocytes Relative: 5.7 % (ref 3.0–12.0)
Neutro Abs: 6 10*3/uL (ref 1.4–7.7)
Neutrophils Relative %: 61 % (ref 43.0–77.0)
Platelets: 242 10*3/uL (ref 150.0–400.0)
RBC: 4.23 Mil/uL (ref 3.87–5.11)
RDW: 14.8 % (ref 11.5–15.5)
WBC: 9.9 10*3/uL (ref 4.0–10.5)

## 2016-03-20 LAB — LIPID PANEL
CHOL/HDL RATIO: 4
Cholesterol: 228 mg/dL — ABNORMAL HIGH (ref 0–200)
HDL: 58.2 mg/dL (ref >39.00)
LDL CALC: 137 mg/dL — AB (ref 0–99)
NonHDL: 170.16
TRIGLYCERIDES: 164 mg/dL — AB (ref 0.0–149.0)
VLDL: 32.8 mg/dL (ref 0.0–40.0)

## 2016-03-20 LAB — BASIC METABOLIC PANEL
BUN: 26 mg/dL — ABNORMAL HIGH (ref 6–23)
CHLORIDE: 104 meq/L (ref 96–112)
CO2: 30 mEq/L (ref 19–32)
Calcium: 9.5 mg/dL (ref 8.4–10.5)
Creatinine, Ser: 0.74 mg/dL (ref 0.40–1.20)
GFR: 82.84 mL/min (ref >60.00)
Glucose, Bld: 87 mg/dL (ref 70–99)
POTASSIUM: 4.2 meq/L (ref 3.5–5.1)
Sodium: 141 mEq/L (ref 135–145)

## 2016-03-20 LAB — TSH: TSH: 1.99 u[IU]/mL (ref 0.35–4.50)

## 2016-03-26 ENCOUNTER — Encounter: Payer: Self-pay | Admitting: Internal Medicine

## 2016-03-26 ENCOUNTER — Ambulatory Visit (INDEPENDENT_AMBULATORY_CARE_PROVIDER_SITE_OTHER): Payer: Medicare Other | Admitting: Internal Medicine

## 2016-03-26 VITALS — BP 122/74 | Temp 97.6°F | Ht 62.25 in | Wt 158.8 lb

## 2016-03-26 DIAGNOSIS — I10 Essential (primary) hypertension: Secondary | ICD-10-CM

## 2016-03-26 DIAGNOSIS — E785 Hyperlipidemia, unspecified: Secondary | ICD-10-CM

## 2016-03-26 DIAGNOSIS — Z0000 Encounter for general adult medical examination without abnormal findings: Secondary | ICD-10-CM

## 2016-03-26 DIAGNOSIS — Z79899 Other long term (current) drug therapy: Secondary | ICD-10-CM | POA: Diagnosis not present

## 2016-03-26 DIAGNOSIS — E2839 Other primary ovarian failure: Secondary | ICD-10-CM

## 2016-03-26 NOTE — Patient Instructions (Addendum)
dexa scan   Take  Lipid med every day .  If increasing shortness of breath or chest pain then contact health care team and consider other evaluation.   Keep blood pressure controlled as you are doing.  Can try dec lexapro to 5 mg per day and see how you do  10 # weight loss will be healthy for you . Weight watchers   Is helpful to many people.    Health Maintenance, Female Adopting a healthy lifestyle and getting preventive care can go a long way to promote health and wellness. Talk with your health care provider about what schedule of regular examinations is right for you. This is a good chance for you to check in with your provider about disease prevention and staying healthy. In between checkups, there are plenty of things you can do on your own. Experts have done a lot of research about which lifestyle changes and preventive measures are most likely to keep you healthy. Ask your health care provider for more information. WEIGHT AND DIET  Eat a healthy diet  Be sure to include plenty of vegetables, fruits, low-fat dairy products, and lean protein.  Do not eat a lot of foods high in solid fats, added sugars, or salt.  Get regular exercise. This is one of the most important things you can do for your health.  Most adults should exercise for at least 150 minutes each week. The exercise should increase your heart rate and make you sweat (moderate-intensity exercise).  Most adults should also do strengthening exercises at least twice a week. This is in addition to the moderate-intensity exercise.  Maintain a healthy weight  Body mass index (BMI) is a measurement that can be used to identify possible weight problems. It estimates body fat based on height and weight. Your health care provider can help determine your BMI and help you achieve or maintain a healthy weight.  For females 63 years of age and older:   A BMI below 18.5 is considered underweight.  A BMI of 18.5 to 24.9 is  normal.  A BMI of 25 to 29.9 is considered overweight.  A BMI of 30 and above is considered obese.  Watch levels of cholesterol and blood lipids  You should start having your blood tested for lipids and cholesterol at 68 years of age, then have this test every 5 years.  You may need to have your cholesterol levels checked more often if:  Your lipid or cholesterol levels are high.  You are older than 68 years of age.  You are at high risk for heart disease.  CANCER SCREENING   Lung Cancer  Lung cancer screening is recommended for adults 74-63 years old who are at high risk for lung cancer because of a history of smoking.  A yearly low-dose CT scan of the lungs is recommended for people who:  Currently smoke.  Have quit within the past 15 years.  Have at least a 30-pack-year history of smoking. A pack year is smoking an average of one pack of cigarettes a day for 1 year.  Yearly screening should continue until it has been 15 years since you quit.  Yearly screening should stop if you develop a health problem that would prevent you from having lung cancer treatment.  Breast Cancer  Practice breast self-awareness. This means understanding how your breasts normally appear and feel.  It also means doing regular breast self-exams. Let your health care provider know about any changes, no matter  how small.  If you are in your 20s or 30s, you should have a clinical breast exam (CBE) by a health care provider every 1-3 years as part of a regular health exam.  If you are 67 or older, have a CBE every year. Also consider having a breast X-ray (mammogram) every year.  If you have a family history of breast cancer, talk to your health care provider about genetic screening.  If you are at high risk for breast cancer, talk to your health care provider about having an MRI and a mammogram every year.  Breast cancer gene (BRCA) assessment is recommended for women who have family members  with BRCA-related cancers. BRCA-related cancers include:  Breast.  Ovarian.  Tubal.  Peritoneal cancers.  Results of the assessment will determine the need for genetic counseling and BRCA1 and BRCA2 testing. Cervical Cancer Your health care provider may recommend that you be screened regularly for cancer of the pelvic organs (ovaries, uterus, and vagina). This screening involves a pelvic examination, including checking for microscopic changes to the surface of your cervix (Pap test). You may be encouraged to have this screening done every 3 years, beginning at age 54.  For women ages 52-65, health care providers may recommend pelvic exams and Pap testing every 3 years, or they may recommend the Pap and pelvic exam, combined with testing for human papilloma virus (HPV), every 5 years. Some types of HPV increase your risk of cervical cancer. Testing for HPV may also be done on women of any age with unclear Pap test results.  Other health care providers may not recommend any screening for nonpregnant women who are considered low risk for pelvic cancer and who do not have symptoms. Ask your health care provider if a screening pelvic exam is right for you.  If you have had past treatment for cervical cancer or a condition that could lead to cancer, you need Pap tests and screening for cancer for at least 20 years after your treatment. If Pap tests have been discontinued, your risk factors (such as having a new sexual partner) need to be reassessed to determine if screening should resume. Some women have medical problems that increase the chance of getting cervical cancer. In these cases, your health care provider may recommend more frequent screening and Pap tests. Colorectal Cancer  This type of cancer can be detected and often prevented.  Routine colorectal cancer screening usually begins at 68 years of age and continues through 68 years of age.  Your health care provider may recommend  screening at an earlier age if you have risk factors for colon cancer.  Your health care provider may also recommend using home test kits to check for hidden blood in the stool.  A small camera at the end of a tube can be used to examine your colon directly (sigmoidoscopy or colonoscopy). This is done to check for the earliest forms of colorectal cancer.  Routine screening usually begins at age 23.  Direct examination of the colon should be repeated every 5-10 years through 68 years of age. However, you may need to be screened more often if early forms of precancerous polyps or small growths are found. Skin Cancer  Check your skin from head to toe regularly.  Tell your health care provider about any new moles or changes in moles, especially if there is a change in a mole's shape or color.  Also tell your health care provider if you have a mole that is  larger than the size of a pencil eraser.  Always use sunscreen. Apply sunscreen liberally and repeatedly throughout the day.  Protect yourself by wearing long sleeves, pants, a wide-brimmed hat, and sunglasses whenever you are outside. HEART DISEASE, DIABETES, AND HIGH BLOOD PRESSURE   High blood pressure causes heart disease and increases the risk of stroke. High blood pressure is more likely to develop in:  People who have blood pressure in the high end of the normal range (130-139/85-89 mm Hg).  People who are overweight or obese.  People who are African American.  If you are 23-28 years of age, have your blood pressure checked every 3-5 years. If you are 48 years of age or older, have your blood pressure checked every year. You should have your blood pressure measured twice--once when you are at a hospital or clinic, and once when you are not at a hospital or clinic. Record the average of the two measurements. To check your blood pressure when you are not at a hospital or clinic, you can use:  An automated blood pressure machine at a  pharmacy.  A home blood pressure monitor.  If you are between 21 years and 17 years old, ask your health care provider if you should take aspirin to prevent strokes.  Have regular diabetes screenings. This involves taking a blood sample to check your fasting blood sugar level.  If you are at a normal weight and have a low risk for diabetes, have this test once every three years after 68 years of age.  If you are overweight and have a high risk for diabetes, consider being tested at a younger age or more often. PREVENTING INFECTION  Hepatitis B  If you have a higher risk for hepatitis B, you should be screened for this virus. You are considered at high risk for hepatitis B if:  You were born in a country where hepatitis B is common. Ask your health care provider which countries are considered high risk.  Your parents were born in a high-risk country, and you have not been immunized against hepatitis B (hepatitis B vaccine).  You have HIV or AIDS.  You use needles to inject street drugs.  You live with someone who has hepatitis B.  You have had sex with someone who has hepatitis B.  You get hemodialysis treatment.  You take certain medicines for conditions, including cancer, organ transplantation, and autoimmune conditions. Hepatitis C  Blood testing is recommended for:  Everyone born from [birth year] through 1963.  Anyone with known risk factors for hepatitis C. Sexually transmitted infections (STIs)  You should be screened for sexually transmitted infections (STIs) including gonorrhea and chlamydia if:  You are sexually active and are younger than 68 years of age.  You are older than 68 years of age and your health care provider tells you that you are at risk for this type of infection.  Your sexual activity has changed since you were last screened and you are at an increased risk for chlamydia or gonorrhea. Ask your health care provider if you are at risk.  If you do not  have HIV, but are at risk, it may be recommended that you take a prescription medicine daily to prevent HIV infection. This is called pre-exposure prophylaxis (PrEP). You are considered at risk if:  You are sexually active and do not regularly use condoms or know the HIV status of your partner(s).  You take drugs by injection.  You are sexually active with  a partner who has HIV. Talk with your health care provider about whether you are at high risk of being infected with HIV. If you choose to begin PrEP, you should first be tested for HIV. You should then be tested every 3 months for as long as you are taking PrEP.  PREGNANCY   If you are premenopausal and you may become pregnant, ask your health care provider about preconception counseling.  If you may become pregnant, take 400 to 800 micrograms (mcg) of folic acid every day.  If you want to prevent pregnancy, talk to your health care provider about birth control (contraception). OSTEOPOROSIS AND MENOPAUSE   Osteoporosis is a disease in which the bones lose minerals and strength with aging. This can result in serious bone fractures. Your risk for osteoporosis can be identified using a bone density scan.  If you are 71 years of age or older, or if you are at risk for osteoporosis and fractures, ask your health care provider if you should be screened.  Ask your health care provider whether you should take a calcium or vitamin D supplement to lower your risk for osteoporosis.  Menopause may have certain physical symptoms and risks.  Hormone replacement therapy may reduce some of these symptoms and risks. Talk to your health care provider about whether hormone replacement therapy is right for you.  HOME CARE INSTRUCTIONS   Schedule regular health, dental, and eye exams.  Stay current with your immunizations.   Do not use any tobacco products including cigarettes, chewing tobacco, or electronic cigarettes.  If you are pregnant, do not  drink alcohol.  If you are breastfeeding, limit how much and how often you drink alcohol.  Limit alcohol intake to no more than 1 drink per day for nonpregnant women. One drink equals 12 ounces of beer, 5 ounces of wine, or 1 ounces of hard liquor.  Do not use street drugs.  Do not share needles.  Ask your health care provider for help if you need support or information about quitting drugs.  Tell your health care provider if you often feel depressed.  Tell your health care provider if you have ever been abused or do not feel safe at home.   This information is not intended to replace advice given to you by your health care provider. Make sure you discuss any questions you have with your health care provider.   Document Released: 03/17/2011 Document Revised: 09/22/2014 Document Reviewed: 08/03/2013 [employer] Interactive Patient Education [employer].

## 2016-03-26 NOTE — Progress Notes (Signed)
Pre visit review using our clinic review tool, if applicable. No additional management support is needed unless otherwise documented below in the visit note.  Chief Complaint  Patient presents with  . Medicare Wellness    HPI: Shelley Wright 70 y.o. comes in today for Preventive Medicare wellness visit . Preventive exam and Chronic disease management Since last visit. LIPIDS forgets to take meds at times  HT taking meds no sx  Lets swell at times end of days  VV Still no tobacco  Sob at times going up hills  No cp no cough lexaproi doing well ?  What to do .    Health Maintenance  Topic Date Due  . DEXA SCAN  10/02/2012  . ZOSTAVAX  03/26/2017 (Originally 10/03/2007)  . Hepatitis C Screening  03/26/2017 (Originally 01-20-1946)  . INFLUENZA VACCINE  04/15/2016  . TETANUS/TDAP  09/15/2016  . MAMMOGRAM  08/27/2017  . COLONOSCOPY  09/20/2017  . 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 Last eye check UTD contacts   Safety:  Has smoke detector and wears seat belts.  No firearms. No excess sun exposure. Sees dentist regularly.  Falls: n  Advance directive :  Reviewed  Has one.  Memory: Felt to be good  , no concern from her or her family.  Depression: No anhedonia unusual crying or depressive symptoms  Nutrition: Eats well balanced diet; adequate calcium and vitamin D. No swallowing chewing problems.  Injury: no major injuries in the last six months.  Other healthcare providers:  Reviewed today .  Social:  Lives with spouse married. Dog  pets.   Preventive parameters: up-to-date  Reviewed   ADLS:   There are no problems or need for assistance  driving, feeding, obtaining food, dressing, toileting and bathing, managing money using phone. She is  independent.     ROS:  See above  GEN/ HEENT: No fever, significant weight changes sweats headaches vision problems hearing changes, CV/ PULM; No chest pain  sometims  Walking up hills shortness of breath  nocough, syncope,edema  change in exercise tolerance. GI /GU: No adominal pain, vomiting, change in bowel habits. No blood in the stool. No significant GU symptoms. SKIN/HEME: ,no acute skin rashes suspicious lesions or bleeding. No lymphadenopathy, nodules, masses.  NEURO/ PSYCH:  No neurologic signs such as weakness numbness. No depression anxiety. IMM/ Allergy: No unusual infections.  Allergy .   REST of 12 system review negative except as per HPI   Past Medical History  Diagnosis Date  . Hypertension   . Hyperlipidemia   . Depression   . Anxiety   . History of ulcerative colitis 2005    pancolitis   . Normal nuclear stress test 2008    myoview   . History of smoking     Family History  Problem Relation Age of Onset  . Breast cancer Mother 16    breast  . Angina Mother   . Hypertension Mother   . Pancreatic cancer Father     deceased  . Hypertension Father   . Colon cancer Neg Hx     Social History   Social History  . Marital Status: Married    Spouse Name: N/A  . Number of Children:  4  . Years of Education: N/A   Social History Main Topics  . Smoking status: Former Smoker    Quit date: 07/04/2014  . Smokeless tobacco: Never Used  . Alcohol Use: 0.0 oz/week    0 Standard drinks or equivalent per week  . Drug Use: No  . Sexual Activity: Not Asked   Other Topics Concern  . None   Social History Narrative   Hh  Of 2    Pet dog    Off tobacco.   [employer].     Teacher runs school.   81 - 3    G4 P4    Husband had cva and blocked carotids     Outpatient Encounter Prescriptions as of 03/26/2016  Medication Sig  . atorvastatin (LIPITOR) 20 MG tablet Take 1 tablet by mouth  daily  . escitalopram (LEXAPRO) 10 MG tablet Take 1/2 to 1 tablet by   mouth every day  . lisinopril-hydrochlorothiazide (PRINZIDE,ZESTORETIC) 20-12.5 MG tablet Take 1 tablet by mouth  daily   No facility-administered encounter medications on file as of 03/26/2016.    EXAM:  BP 122/74 mmHg  Temp(Src) 97.6 F (36.4 C) (Oral)  Ht 5' 2.25" (1.581 m)  Wt 158 lb 12.8 oz (72.031 kg)  BMI 28.82 kg/m2  Body mass index is 28.82 kg/(m^2).  Physical Exam: Vital signs reviewed DXA:JOIN is a well-developed well-nourished alert cooperative   who appears stated age in no acute distress.  HEENT: normocephalic atraumatic , Eyes: PERRL EOM's full, conjunctiva clear, Nares: paten,t no deformity discharge or tenderness., Ears: no deformity EAC's clear TMs with normal landmarks. Mouth: clear OP, no lesions, edema.  Moist mucous membranes. Dentition in adequate repair. NECK: supple without masses, thyromegaly or bruits. CHEST/PULM:  Clear to auscultation and percussion breath sounds equal no wheeze , rales or rhonchi. No chest wall deformities or tenderness. CV: PMI is nondisplaced, S1 S2 no gallops, murmurs, rubs. Peripheral pulses are full without delay.No JVD . Breast: normal by inspection . No dimpling, discharge, masses, tenderness or discharge . ABDOMEN: Bowel sounds normal nontender  No guard or rebound, no hepato splenomegal no CVA tenderness.   Extremtities:  No clubbing cyanosis or edema, no acute joint swelling or redness no focal atrophy NEURO:  Oriented x3, cranial nerves 3-12 appear to be intact, no obvious focal weakness,gait within normal limits no abnormal reflexes or asymmetrical SKIN: No acute rashes normal turgor, color, no bruising or petechiae. PSYCH: Oriented, good eye contact, no obvious depression anxiety, cognition and judgment appear normal. LN: no cervical axillary inguinal adenopathy No noted deficits in memory, attention, and speech.   Lab Results  Component Value Date   WBC 9.9 03/20/2016   HGB 13.2 03/20/2016   HCT 39.6 03/20/2016   PLT  242.0 03/20/2016   GLUCOSE 87 03/20/2016   CHOL 228* 03/20/2016   TRIG 164.0* 03/20/2016   HDL 58.20 03/20/2016   LDLDIRECT 138.5 10/17/2013   LDLCALC 137* 03/20/2016   ALT 16 03/20/2016   AST 20 03/20/2016   NA 141 03/20/2016   K 4.2 03/20/2016   CL 104 03/20/2016   CREATININE 0.74 03/20/2016   BUN 26* 03/20/2016   CO2 30 03/20/2016   TSH 1.99 03/20/2016  reviewed labs  ASSESSMENT AND PLAN:  Discussed the following assessment and plan:  Visit for preventive health examination  Medicare annual wellness visit, initial  Essential hypertension  Hyperlipidemia - take meds daily tg lsi for elevated tg  Medication management  Estrogen deficiency - Plan:  DG Bone Density  dexa   Due ordered when convecient  Patient Care Team: Burnis Medin, MD as PCP - General  Patient Instructions  dexa scan   Take  Lipid med every day .  If increasing shortness of breath or chest pain then contact health care team and consider other evaluation.   Keep blood pressure controlled as you are doing.  Can try dec lexapro to 5 mg per day and see how you do  10 # weight loss will be healthy for you . Weight watchers   Is helpful to many people.    Health Maintenance, Female Adopting a healthy lifestyle and getting preventive care can go a long way to promote health and wellness. Talk with your health care provider about what schedule of regular examinations is right for you. This is a good chance for you to check in with your provider about disease prevention and staying healthy. In between checkups, there are plenty of things you can do on your own. Experts have done a lot of research about which lifestyle changes and preventive measures are most likely to keep you healthy. Ask your health care provider for more information. WEIGHT AND DIET  Eat a healthy diet  Be sure to include plenty of vegetables, fruits, low-fat dairy products, and lean protein.  Do not eat a lot of foods high in solid  fats, added sugars, or salt.  Get regular exercise. This is one of the most important things you can do for your health.  Most adults should exercise for at least 150 minutes each week. The exercise should increase your heart rate and make you sweat (moderate-intensity exercise).  Most adults should also do strengthening exercises at least twice a week. This is in addition to the moderate-intensity exercise.  Maintain a healthy weight  Body mass index (BMI) is a measurement that can be used to identify possible weight problems. It estimates body fat based on height and weight. Your health care provider can help determine your BMI and help you achieve or maintain a healthy weight.  For females 7 years of age and older:   A BMI below 18.5 is considered underweight.  A BMI of 18.5 to 24.9 is normal.  A BMI of 25 to 29.9 is considered overweight.  A BMI of 30 and above is considered obese.  Watch levels of cholesterol and blood lipids  You should start having your blood tested for lipids and cholesterol at 68 years of age, then have this test every 5 years.  You may need to have your cholesterol levels checked more often if:  Your lipid or cholesterol levels are high.  You are older than 68 years of age.  You are at high risk for heart disease.  CANCER SCREENING   Lung Cancer  Lung cancer screening is recommended for adults 55-16 years old who are at high risk for lung cancer because of a history of smoking.  A yearly low-dose CT scan of the lungs is recommended for people who:  Currently smoke.  Have quit within the past 15 years.  Have at least a 30-pack-year history of smoking. A pack year is smoking an average of one pack of cigarettes a day for 1 year.  Yearly screening should continue until it has been 15 years since you quit.  Yearly screening should stop if you develop a health problem that would prevent you from having lung cancer treatment.  Breast  Cancer  Practice breast self-awareness. This  means understanding how your breasts normally appear and feel.  It also means doing regular breast self-exams. Let your health care provider know about any changes, no matter how small.  If you are in your 20s or 30s, you should have a clinical breast exam (CBE) by a health care provider every 1-3 years as part of a regular health exam.  If you are 78 or older, have a CBE every year. Also consider having a breast X-ray (mammogram) every year.  If you have a family history of breast cancer, talk to your health care provider about genetic screening.  If you are at high risk for breast cancer, talk to your health care provider about having an MRI and a mammogram every year.  Breast cancer gene (BRCA) assessment is recommended for women who have family members with BRCA-related cancers. BRCA-related cancers include:  Breast.  Ovarian.  Tubal.  Peritoneal cancers.  Results of the assessment will determine the need for genetic counseling and BRCA1 and BRCA2 testing. Cervical Cancer Your health care provider may recommend that you be screened regularly for cancer of the pelvic organs (ovaries, uterus, and vagina). This screening involves a pelvic examination, including checking for microscopic changes to the surface of your cervix (Pap test). You may be encouraged to have this screening done every 3 years, beginning at age 78.  For women ages 86-65, health care providers may recommend pelvic exams and Pap testing every 3 years, or they may recommend the Pap and pelvic exam, combined with testing for human papilloma virus (HPV), every 5 years. Some types of HPV increase your risk of cervical cancer. Testing for HPV may also be done on women of any age with unclear Pap test results.  Other health care providers may not recommend any screening for nonpregnant women who are considered low risk for pelvic cancer and who do not have symptoms. Ask your  health care provider if a screening pelvic exam is right for you.  If you have had past treatment for cervical cancer or a condition that could lead to cancer, you need Pap tests and screening for cancer for at least 20 years after your treatment. If Pap tests have been discontinued, your risk factors (such as having a new sexual partner) need to be reassessed to determine if screening should resume. Some women have medical problems that increase the chance of getting cervical cancer. In these cases, your health care provider may recommend more frequent screening and Pap tests. Colorectal Cancer  This type of cancer can be detected and often prevented.  Routine colorectal cancer screening usually begins at 69 years of age and continues through 68 years of age.  Your health care provider may recommend screening at an earlier age if you have risk factors for colon cancer.  Your health care provider may also recommend using home test kits to check for hidden blood in the stool.  A small camera at the end of a tube can be used to examine your colon directly (sigmoidoscopy or colonoscopy). This is done to check for the earliest forms of colorectal cancer.  Routine screening usually begins at age 45.  Direct examination of the colon should be repeated every 5-10 years through 68 years of age. However, you may need to be screened more often if early forms of precancerous polyps or small growths are found. Skin Cancer  Check your skin from head to toe regularly.  Tell your health care provider about any new moles or changes in  moles, especially if there is a change in a mole's shape or color.  Also tell your health care provider if you have a mole that is larger than the size of a pencil eraser.  Always use sunscreen. Apply sunscreen liberally and repeatedly throughout the day.  Protect yourself by wearing long sleeves, pants, a wide-brimmed hat, and sunglasses whenever you are outside. HEART  DISEASE, DIABETES, AND HIGH BLOOD PRESSURE   High blood pressure causes heart disease and increases the risk of stroke. High blood pressure is more likely to develop in:  People who have blood pressure in the high end of the normal range (130-139/85-89 mm Hg).  People who are overweight or obese.  People who are African American.  If you are 64-61 years of age, have your blood pressure checked every 3-5 years. If you are 80 years of age or older, have your blood pressure checked every year. You should have your blood pressure measured twice--once when you are at a hospital or clinic, and once when you are not at a hospital or clinic. Record the average of the two measurements. To check your blood pressure when you are not at a hospital or clinic, you can use:  An automated blood pressure machine at a pharmacy.  A home blood pressure monitor.  If you are between 72 years and 20 years old, ask your health care provider if you should take aspirin to prevent strokes.  Have regular diabetes screenings. This involves taking a blood sample to check your fasting blood sugar level.  If you are at a normal weight and have a low risk for diabetes, have this test once every three years after 68 years of age.  If you are overweight and have a high risk for diabetes, consider being tested at a younger age or more often. PREVENTING INFECTION  Hepatitis B  If you have a higher risk for hepatitis B, you should be screened for this virus. You are considered at high risk for hepatitis B if:  You were born in a country where hepatitis B is common. Ask your health care provider which countries are considered high risk.  Your parents were born in a high-risk country, and you have not been immunized against hepatitis B (hepatitis B vaccine).  You have HIV or AIDS.  You use needles to inject street drugs.  You live with someone who has hepatitis B.  You have had sex with someone who has hepatitis  B.  You get hemodialysis treatment.  You take certain medicines for conditions, including cancer, organ transplantation, and autoimmune conditions. Hepatitis C  Blood testing is recommended for:  Everyone born from [birth year] through 1963.  Anyone with known risk factors for hepatitis C. Sexually transmitted infections (STIs)  You should be screened for sexually transmitted infections (STIs) including gonorrhea and chlamydia if:  You are sexually active and are younger than 68 years of age.  You are older than 68 years of age and your health care provider tells you that you are at risk for this type of infection.  Your sexual activity has changed since you were last screened and you are at an increased risk for chlamydia or gonorrhea. Ask your health care provider if you are at risk.  If you do not have HIV, but are at risk, it may be recommended that you take a prescription medicine daily to prevent HIV infection. This is called pre-exposure prophylaxis (PrEP). You are considered at risk if:  You are sexually  active and do not regularly use condoms or know the HIV status of your partner(s).  You take drugs by injection.  You are sexually active with a partner who has HIV. Talk with your health care provider about whether you are at high risk of being infected with HIV. If you choose to begin PrEP, you should first be tested for HIV. You should then be tested every 3 months for as long as you are taking PrEP.  PREGNANCY   If you are premenopausal and you may become pregnant, ask your health care provider about preconception counseling.  If you may become pregnant, take 400 to 800 micrograms (mcg) of folic acid every day.  If you want to prevent pregnancy, talk to your health care provider about birth control (contraception). OSTEOPOROSIS AND MENOPAUSE   Osteoporosis is a disease in which the bones lose minerals and strength with aging. This can result in serious bone fractures. Your  risk for osteoporosis can be identified using a bone density scan.  If you are 75 years of age or older, or if you are at risk for osteoporosis and fractures, ask your health care provider if you should be screened.  Ask your health care provider whether you should take a calcium or vitamin D supplement to lower your risk for osteoporosis.  Menopause may have certain physical symptoms and risks.  Hormone replacement therapy may reduce some of these symptoms and risks. Talk to your health care provider about whether hormone replacement therapy is right for you.  HOME CARE INSTRUCTIONS   Schedule regular health, dental, and eye exams.  Stay current with your immunizations.   Do not use any tobacco products including cigarettes, chewing tobacco, or electronic cigarettes.  If you are pregnant, do not drink alcohol.  If you are breastfeeding, limit how much and how often you drink alcohol.  Limit alcohol intake to no more than 1 drink per day for nonpregnant women. One drink equals 12 ounces of beer, 5 ounces of wine, or 1 ounces of hard liquor.  Do not use street drugs.  Do not share needles.  Ask your health care provider for help if you need support or information about quitting drugs.  Tell your health care provider if you often feel depressed.  Tell your health care provider if you have ever been abused or do not feel safe at home.   This information is not intended to replace advice given to you by your health care provider. Make sure you discuss any questions you have with your health care provider.   Document Released: 03/17/2011 Document Revised: 09/22/2014 Document Reviewed: 08/03/2013 Elsevier Interactive Patient Education 2016 Higginson K. Panosh M.D.

## 2016-04-02 ENCOUNTER — Other Ambulatory Visit: Payer: Self-pay | Admitting: Family Medicine

## 2016-04-02 MED ORDER — ATORVASTATIN CALCIUM 20 MG PO TABS: ORAL_TABLET | ORAL | Status: DC

## 2016-04-02 MED ORDER — ESCITALOPRAM OXALATE 10 MG PO TABS: ORAL_TABLET | ORAL | Status: DC

## 2016-04-02 MED ORDER — LISINOPRIL-HYDROCHLOROTHIAZIDE 20-12.5 MG PO TABS: ORAL_TABLET | ORAL | Status: DC

## 2016-04-02 NOTE — Telephone Encounter (Signed)
Sent to the pharmacy by e-scribe. 

## 2016-06-05 DIAGNOSIS — Z23 Encounter for immunization: Secondary | ICD-10-CM | POA: Diagnosis not present

## 2016-07-30 ENCOUNTER — Telehealth: Payer: Self-pay | Admitting: *Deleted

## 2016-07-30 ENCOUNTER — Other Ambulatory Visit: Payer: Self-pay | Admitting: Gastroenterology

## 2016-07-30 NOTE — Telephone Encounter (Signed)
Sent letter out to patient with appointment date and time for lialda refills and UC. Patient is past due for her 6 month follow up

## 2016-10-06 ENCOUNTER — Ambulatory Visit: Payer: Medicare Other | Admitting: Gastroenterology

## 2016-11-17 DIAGNOSIS — Z1231 Encounter for screening mammogram for malignant neoplasm of breast: Secondary | ICD-10-CM | POA: Diagnosis not present

## 2016-11-17 DIAGNOSIS — Z803 Family history of malignant neoplasm of breast: Secondary | ICD-10-CM | POA: Diagnosis not present

## 2016-11-17 LAB — HM MAMMOGRAPHY

## 2016-11-18 ENCOUNTER — Ambulatory Visit: Payer: Medicare Other | Admitting: Gastroenterology

## 2016-11-19 ENCOUNTER — Encounter: Payer: Self-pay | Admitting: Internal Medicine

## 2016-12-19 ENCOUNTER — Other Ambulatory Visit: Payer: Self-pay | Admitting: Gastroenterology

## 2016-12-19 NOTE — Telephone Encounter (Signed)
Pt needs an office appt before any further refills

## 2016-12-25 ENCOUNTER — Encounter (INDEPENDENT_AMBULATORY_CARE_PROVIDER_SITE_OTHER): Payer: Self-pay

## 2016-12-25 ENCOUNTER — Encounter: Payer: Self-pay | Admitting: Gastroenterology

## 2016-12-25 ENCOUNTER — Other Ambulatory Visit (INDEPENDENT_AMBULATORY_CARE_PROVIDER_SITE_OTHER): Payer: Medicare Other

## 2016-12-25 ENCOUNTER — Ambulatory Visit (INDEPENDENT_AMBULATORY_CARE_PROVIDER_SITE_OTHER): Payer: Medicare Other | Admitting: Gastroenterology

## 2016-12-25 VITALS — BP 100/62 | HR 68 | Ht 62.0 in | Wt 157.8 lb

## 2016-12-25 DIAGNOSIS — R12 Heartburn: Secondary | ICD-10-CM | POA: Diagnosis not present

## 2016-12-25 DIAGNOSIS — K51811 Other ulcerative colitis with rectal bleeding: Secondary | ICD-10-CM | POA: Diagnosis not present

## 2016-12-25 DIAGNOSIS — K219 Gastro-esophageal reflux disease without esophagitis: Secondary | ICD-10-CM

## 2016-12-25 LAB — CBC WITH DIFFERENTIAL/PLATELET
BASOS ABS: 0.1 10*3/uL (ref 0.0–0.1)
Basophils Relative: 0.5 % (ref 0.0–3.0)
EOS ABS: 0.7 10*3/uL (ref 0.0–0.7)
Eosinophils Relative: 5.8 % — ABNORMAL HIGH (ref 0.0–5.0)
HCT: 42.8 % (ref 36.0–46.0)
HEMOGLOBIN: 13.9 g/dL (ref 12.0–15.0)
LYMPHS PCT: 31.3 % (ref 12.0–46.0)
Lymphs Abs: 4 10*3/uL (ref 0.7–4.0)
MCHC: 32.5 g/dL (ref 30.0–36.0)
MCV: 93.6 fl (ref 78.0–100.0)
Monocytes Absolute: 1 10*3/uL (ref 0.1–1.0)
Monocytes Relative: 7.8 % (ref 3.0–12.0)
Neutro Abs: 6.9 10*3/uL (ref 1.4–7.7)
Neutrophils Relative %: 54.6 % (ref 43.0–77.0)
Platelets: 257 10*3/uL (ref 150.0–400.0)
RBC: 4.57 Mil/uL (ref 3.87–5.11)
RDW: 14.1 % (ref 11.5–15.5)
WBC: 12.7 10*3/uL — AB (ref 4.0–10.5)

## 2016-12-25 LAB — COMPREHENSIVE METABOLIC PANEL
ALBUMIN: 4.5 g/dL (ref 3.5–5.2)
ALT: 19 U/L (ref 0–35)
AST: 22 U/L (ref 0–37)
Alkaline Phosphatase: 40 U/L (ref 39–117)
BILIRUBIN TOTAL: 0.7 mg/dL (ref 0.2–1.2)
BUN: 33 mg/dL — AB (ref 6–23)
CALCIUM: 9.8 mg/dL (ref 8.4–10.5)
CO2: 29 mEq/L (ref 19–32)
Chloride: 102 mEq/L (ref 96–112)
Creatinine, Ser: 0.9 mg/dL (ref 0.40–1.20)
GFR: 65.94 mL/min (ref >60.00)
Glucose, Bld: 93 mg/dL (ref 70–99)
Potassium: 4.3 mEq/L (ref 3.5–5.1)
Sodium: 138 mEq/L (ref 135–145)
TOTAL PROTEIN: 7.5 g/dL (ref 6.0–8.3)

## 2016-12-25 LAB — FOLATE: Folate: 10.2 ng/mL (ref >5.9)

## 2016-12-25 LAB — FERRITIN: FERRITIN: 220 ng/mL (ref 10.0–291.0)

## 2016-12-25 LAB — HIGH SENSITIVITY CRP: CRP HIGH SENSITIVITY: 1.46 mg/L (ref 0.000–5.000)

## 2016-12-25 LAB — VITAMIN B12: Vitamin B-12: 660 pg/mL (ref 211–911)

## 2016-12-25 MED ORDER — RANITIDINE HCL 150 MG PO TABS: 150.0000 mg | ORAL_TABLET | Freq: Two times a day (BID) | ORAL | 3 refills | Status: DC

## 2016-12-25 MED ORDER — SULFASALAZINE 500 MG PO TBEC: 1000.0000 mg | DELAYED_RELEASE_TABLET | Freq: Two times a day (BID) | ORAL | 6 refills | Status: DC

## 2016-12-25 MED ORDER — NA SULFATE-K SULFATE-MG SULF 17.5-3.13-1.6 GM/177ML PO SOLN
1.0000 | Freq: Once | ORAL | 0 refills | Status: AC
Start: 2016-12-25 — End: 2016-12-25

## 2016-12-25 NOTE — Patient Instructions (Addendum)
You have been scheduled for an endoscopy and colonoscopy. Please follow the written instructions given to you at your visit today. Please pick up your prep supplies at the pharmacy within the next 1-3 days. If you use inhalers (even only as needed), please bring them with you on the day of your procedure. Your physician has requested that you go to www.startemmi.com and enter the access code given to you at your visit today. This web site gives a general overview about your procedure. However, you should still follow specific instructions given to you by our office regarding your preparation for the procedure.  Go to the basement for labs today  Use VSL # 3 112 B U daily  We will send Sulfasalazine and Zantac to your pharmacy   Take 1 folic acid daily over the counter

## 2016-12-25 NOTE — Progress Notes (Signed)
Shelley Wright    [id number]    [birth year]  Primary Care Physician:PANOSH,WANDA Harmon Pier, MD  Referring Physician: Burnis Medin, MD [place], [place] [address]  Chief complaint:  Blood per rectum, ulcerative colitis   HPI: 71 year old female with history of ulcerative colitis diagnosed in 2001, flare in 2007 here for follow-up visit. She was last seen in October 2016. She had stopped taking mesalamine and she noticed intermittent blood per rectum in November 2017, called our office to get refill for mesalamine. She has noticed decreased frequency of blood per rectum and currently she has it only occasionally. Denies any diarrhea or increased stool frequency. Last colonoscopy in in January 2017 with no evidence of active colitis. 1 tubular adenoma was removed. Patient also complained of increasing heartburn with regurgitation, something new and she is getting more frequent episodes. Denies any dysphagia or odynophagia. No nausea or vomiting. She has intermittent mild epigastric discomfort. No association with diet or activity.  No fever or chills, skin rash or joint pain.    Outpatient Encounter Prescriptions as of 12/25/2016  Medication Sig  . atorvastatin (LIPITOR) 20 MG tablet Take 1 tablet by mouth  daily  . escitalopram (LEXAPRO) 10 MG tablet Take 1/2 to 1 tablet by  mouth every day  . lisinopril-hydrochlorothiazide (PRINZIDE,ZESTORETIC) 20-12.5 MG tablet Take 1 tablet by mouth  daily  . meloxicam (MOBIC) 15 MG tablet Take 15 mg by mouth daily.   . mesalamine (LIALDA) 1.2 g EC tablet Take 1.2 g by mouth daily with breakfast.   . [DISCONTINUED] mesalamine (LIALDA) 1.2 g EC tablet TAKE 1 TABLET BY MOUTH EVERY DAY WITH breakfast   No facility-administered encounter medications on file as of 12/25/2016.     Allergies as of 12/25/2016  . (No Known Allergies)    Past Medical History:  Diagnosis Date  . Anxiety   . Depression   . History of smoking     . History of ulcerative colitis 2005   pancolitis   . Hyperlipidemia   . Hypertension   . Normal nuclear stress test 2008   myoview     Past Surgical History:  Procedure Laterality Date  . BREAST BIOPSY Left 1986   biopsy  . COLONOSCOPY    . KNEE SURGERY Left october   x 2    Family History  Problem Relation Age of Onset  . Pancreatic cancer Father     deceased  . Hypertension Father   . Breast cancer Mother 33  . Angina Mother   . Hypertension Mother   . Colon cancer Neg Hx     Social History   Social History  . Marital status: Married    Spouse name: N/A  . Number of children: 4  . Years of education: N/A   Occupational History  . Not on file.   Social History Main Topics  . Smoking status: Former Smoker    Quit date: 07/04/2014  . Smokeless tobacco: Never Used  . Alcohol use No  . Drug use: No  . Sexual activity: Not on file   Other Topics Concern  . Not on file   Social History Narrative   Hh  Of 2    Pet dog    Off tobacco.   [employer].     Teacher runs school.   8 - 3    G4 P4    Husband had cva and blocked carotids  Review of systems: Review of Systems  Constitutional: Negative for fever and chills.  HENT: Negative.   Eyes: Negative for blurred vision.  Respiratory: Negative for cough, shortness of breath and wheezing.   Cardiovascular: Negative for chest pain and palpitations.  Gastrointestinal: as per HPI Genitourinary: Negative for dysuria, urgency, frequency and hematuria.  Musculoskeletal: Negative for myalgias, back pain and joint pain.  Skin: Negative for itching and rash.  Neurological: Negative for dizziness, tremors, focal weakness, seizures and loss of consciousness.  Endo/Heme/Allergies: Positive for seasonal allergies.  Psychiatric/Behavioral: Negative for depression, suicidal ideas and hallucinations.  All other systems reviewed and are negative.   Physical Exam: Vitals:   12/25/16 1424  BP: 100/62   Pulse: 68   Body mass index is 28.86 kg/m. Gen:      No acute distress HEENT:  EOMI, sclera anicteric Neck:     No masses; no thyromegaly Lungs:    Clear to auscultation bilaterally; normal respiratory effort CV:         Regular rate and rhythm; no murmurs Abd:      + bowel sounds; soft, non-tender; no palpable masses, no distension Ext:    No edema; adequate peripheral perfusion Skin:      Warm and dry; no rash Neuro: alert and oriented x 3 Psych: normal mood and affect  Data Reviewed:  Reviewed labs, radiology imaging, old records and pertinent past GI work up   Assessment and Plan/Recommendations:  71 year old female with history of ulcerative colitis diagnosed in 2001 here with complaints of intermittent blood per rectum  Schedule for colonoscopy for evaluation of disease activity and blood per rectum Start probiotic VSL #3 112 billion units Check CBC, BMP and CRP Patient requested switching to sulfasalazine lesser out of pocket expense, 1 gm twice daily and advised patient to take folic acid 1 mg daily along with it  Recent onset GERD symptoms: Start ranitidine 150 mg twice daily Patient is worried about her recent onset symptoms Schedule EGD along with colonoscopy for further evaluation, exclude esophagitis  The risks and benefits as well as alternatives of endoscopic procedure(s) have been discussed and reviewed. All questions answered. The patient agrees to proceed.  Return in 6-12 months   K. Denzil Magnuson , MD [phone] Mon-Fri 8a-5p [phone] after 5p, weekends, holidays  CC: Panosh, Standley Brooking, MD

## 2016-12-31 ENCOUNTER — Encounter: Payer: Self-pay | Admitting: Gastroenterology

## 2017-01-02 ENCOUNTER — Other Ambulatory Visit: Payer: Self-pay | Admitting: Internal Medicine

## 2017-01-14 ENCOUNTER — Encounter: Payer: Medicare Other | Admitting: Gastroenterology

## 2017-04-14 ENCOUNTER — Other Ambulatory Visit: Payer: Self-pay | Admitting: Internal Medicine

## 2017-04-14 ENCOUNTER — Telehealth: Payer: Self-pay | Admitting: Emergency Medicine

## 2017-04-14 NOTE — Telephone Encounter (Signed)
Sent in 90 day supply into pharmacy. Patient is past due for annual. Please help patient schedule an appointment.

## 2017-04-15 ENCOUNTER — Other Ambulatory Visit: Payer: Self-pay | Admitting: Emergency Medicine

## 2017-04-15 MED ORDER — ESCITALOPRAM OXALATE 10 MG PO TABS: ORAL_TABLET | ORAL | 0 refills | Status: DC

## 2017-04-15 NOTE — Telephone Encounter (Signed)
lmom for pt to call back

## 2017-04-15 NOTE — Telephone Encounter (Signed)
Pt has been sch

## 2017-04-15 NOTE — Telephone Encounter (Signed)
Ok to refill until her cpx and lab in September

## 2017-05-11 ENCOUNTER — Telehealth: Payer: Self-pay | Admitting: *Deleted

## 2017-05-11 MED ORDER — SULFASALAZINE 500 MG PO TBEC: 1000.0000 mg | DELAYED_RELEASE_TABLET | Freq: Two times a day (BID) | ORAL | 6 refills | Status: DC

## 2017-05-11 NOTE — Telephone Encounter (Signed)
Med refilled to [facility] per fax request

## 2017-05-28 DIAGNOSIS — H5203 Hypermetropia, bilateral: Secondary | ICD-10-CM | POA: Diagnosis not present

## 2017-05-28 DIAGNOSIS — H524 Presbyopia: Secondary | ICD-10-CM | POA: Diagnosis not present

## 2017-05-28 DIAGNOSIS — H2513 Age-related nuclear cataract, bilateral: Secondary | ICD-10-CM | POA: Diagnosis not present

## 2017-06-01 NOTE — Progress Notes (Signed)
Chief Complaint  Patient presents with  . Annual Exam    C/o aches and pains all over body.     HPI:  Shelley Wright 71 y.o. comes in today for Preventive Medicare exam/visit .Since last visit. And Chronic disease management  BP  Taking med no se of med  LIPIDS taking atorva   UC stable on med    She has been off meloxicam for 4-6 months and states that now she "hurts all over" his achiness in the morning and stiffness. This medicine was for her knee that had surgery. However she has noticed increasing musculoskeletal pain she is since she's been off this medicine acids she should go back on.  She does get swelling in her feet at the end of the day.  She is worried about her memory forgets things sometimes names someone talking to her but never getting lost or calculation problem. She has been under stress recently her mom died age 57 of Alzheimer's.  On lexapro has helped  ? If should remain on this   Never screened hep c no risk hx    Health Maintenance  Topic Date Due  . Hepatitis C Screening  [birth year]  . DEXA SCAN  10/02/2012  . TETANUS/TDAP  09/15/2016  . INFLUENZA VACCINE  04/15/2017  . COLONOSCOPY  09/20/2017  . MAMMOGRAM  11/18/2018  . PNA vac Low Risk Adult  Completed   Health Maintenance Review LIFESTYLE:  Exercise:  n Tobacco/ETS:n Alcohol: n Sugar beverages:n Sleep:8 hours  Drug use: no HH:2 no current pet  Works   Mom passed recnetly      Hearing: ok  Vision:  No limitations at present . Last eye check UTD  Safety:  Has smoke detector and wears seat belts.  No firearms. No excess sun exposure. Sees dentist regularly.  Falls: n   Memory: concern see above  , no concern from her or her family.  Depression: No anhedonia unusual crying or depressive symptoms  Nutrition: Eats well balanced diet; adequate calcium and vitamin D. No swallowing chewing problems.  Injury: no major injuries in the last six months.  Other healthcare  providers:  Reviewed today .  Social:  Lives with spouse married. No pets.   Preventive parameters: up-to-date  Reviewed   ADLS:   There are no problems or need for assistance  driving, feeding, obtaining food, dressing, toileting and bathing, managing money using phone. She is independent.   ROS:  Itchy ears  GEN/ HEENT: No fever, significant weight changes sweats headaches vision problems hearing changes, CV/ PULM; No chest pain shortness of breath cough, syncope,edema  change in exercise tolerance. GI /GU: No adominal pain, vomiting, change in bowel habits. No blood in the stool. No significant GU symptoms. SKIN/HEME: ,no acute skin rashes suspicious lesions or bleeding. No lymphadenopathy, nodules, masses.  NEURO/ PSYCH:  No neurologic signs such as weakness numbness. No depression anxiety. IMM/ Allergy: No unusual infections.  Allergy .   REST of 12 system review negative except as per HPI   Past Medical History:  Diagnosis Date  . Anxiety   . Depression   . History of smoking   . History of ulcerative colitis 2005   pancolitis   . Hyperlipidemia   . Hypertension   . Normal nuclear stress test 2008   myoview     Family History  Problem Relation Age of Onset  . Pancreatic cancer Father        deceased  . Hypertension  Father   . Breast cancer Mother 74  . Angina Mother   . Hypertension Mother   . Colon cancer Neg Hx     Social History   Social History  . Marital status: Married    Spouse name: N/A  . Number of children: 4  . Years of education: N/A   Social History Main Topics  . Smoking status: Former Smoker    Quit date: 07/04/2014  . Smokeless tobacco: Never Used  . Alcohol use No  . Drug use: No  . Sexual activity: Not Asked   Other Topics Concern  . None   Social History Narrative   Hh  Of 2    Pet dog    Off tobacco.   [employer].     Teacher runs school.   29 - 3    G4 P4    Husband had cva and blocked carotids     Outpatient  Encounter Prescriptions as of 06/02/2017  Medication Sig  . atorvastatin (LIPITOR) 20 MG tablet TAKE 1 TABLET BY MOUTH  DAILY  . escitalopram (LEXAPRO) 10 MG tablet TAKE 1/2 TO 1 TABLET BY  MOUTH EVERY DAY  . lisinopril-hydrochlorothiazide (PRINZIDE,ZESTORETIC) 20-12.5 MG tablet TAKE 1 TABLET BY MOUTH  DAILY  . sulfaSALAzine (AZULFIDINE) 500 MG EC tablet Take 2 tablets (1,000 mg total) by mouth 2 (two) times daily. (Patient taking differently: Take 500 mg by mouth daily. )  . meloxicam (MOBIC) 15 MG tablet Take 15 mg by mouth daily.   . mesalamine (LIALDA) 1.2 g EC tablet Take 1.2 g by mouth daily with breakfast.   . ranitidine (ZANTAC) 150 MG tablet Take 1 tablet (150 mg total) by mouth 2 (two) times daily. (Patient not taking: Reported on 06/02/2017)   No facility-administered encounter medications on file as of 06/02/2017.     EXAM:  BP 126/78 (BP Location: Right Arm, Patient Position: Sitting, Cuff Size: Normal)   Pulse 80   Temp 97.9 F (36.6 C) (Oral)   Ht 5' 1.25" (1.556 m)   Wt 161 lb 3.2 oz (73.1 kg)   BMI 30.21 kg/m   Body mass index is 30.21 kg/m.  Physical Exam: Vital signs reviewed WIO:XBDZ is a well-developed well-nourished alert cooperative   who appears stated age in no acute distress.  HEENT: normocephalic atraumatic , Eyes: PERRL EOM's full, conjunctiva clear, Nares: paten,t no deformity discharge or tenderness., Ears: no deformity EAC's clear scaly  TMs with normal landmarks. Mouth: clear OP, no lesions, edema.  Moist mucous membranes. Dentition in adequate repair. NECK: supple without masses, thyromegaly or bruits. CHEST/PULM:  Clear to auscultation and percussion breath sounds equal no wheeze , rales or rhonchi. No chest wall deformities or tenderness. CV: PMI is nondisplaced, S1 S2 no gallops, murmurs, rubs. Peripheral pulses are full without delay.No JVD . Breast: normal by inspection . No dimpling, discharge, masses, tenderness or discharge . ABDOMEN: Bowel  sounds normal nontender  No guard or rebound, no hepato splenomegal no CVA tenderness.   Extremtities:  No clubbing cyanosis or edema, no acute joint swelling or redness no focal atrophy  Legs  Some VV  Noted  NEURO:  Oriented x3, cranial nerves 3-12 appear to be intact, no obvious focal weakness,gait within normal limits  SKIN: No acute rashes normal turgor, color, no bruising or petechiae. PSYCH: Oriented, good eye contact, no obvious depression anxiety, cognition and judgment appear normal. LN: no cervical axillary inguinal adenopathy No noted deficits in memory, attention, and speech. On  conversation although said year was  2019      ASSESSMENT AND PLAN:  Discussed the following assessment and plan:  Visit for preventive health examination  Essential hypertension - Plan: Basic metabolic panel, CBC with Differential/Platelet, Hepatic function panel, Lipid panel, TSH, T4, free, C-reactive protein, Vitamin E42, Cyclic citrul peptide antibody, IgG, HLA-B27 antigen, CK, Sedimentation rate  Medication management - at this time continue lexapro stress times consider weaning   later  - Plan: Basic metabolic panel, CBC with Differential/Platelet, Hepatic function panel, Lipid panel, TSH, T4, free, C-reactive protein, Vitamin P53, Cyclic citrul peptide antibody, IgG, HLA-B27 antigen, CK, Sedimentation rate  Hyperlipidemia, unspecified hyperlipidemia type - Plan: Basic metabolic panel, CBC with Differential/Platelet, Hepatic function panel, Lipid panel, TSH, T4, free, C-reactive protein, Vitamin I14, Cyclic citrul peptide antibody, IgG, HLA-B27 antigen, CK, Sedimentation rate  Arthralgia, unspecified joint - Plan: Basic metabolic panel, CBC with Differential/Platelet, Hepatic function panel, Lipid panel, TSH, T4, free, C-reactive protein, Vitamin E31, Cyclic citrul peptide antibody, IgG, HLA-B27 antigen, CK, Sedimentation rate  Memory difficulty - Plan: Basic metabolic panel, CBC with  Differential/Platelet, Hepatic function panel, Lipid panel, TSH, T4, free, C-reactive protein, Vitamin V40, Cyclic citrul peptide antibody, IgG, HLA-B27 antigen, CK, Sedimentation rate  Body aches - Plan: Basic metabolic panel, CBC with Differential/Platelet, Hepatic function panel, Lipid panel, TSH, T4, free, C-reactive protein, Vitamin G86, Cyclic citrul peptide antibody, IgG, HLA-B27 antigen, CK, Sedimentation rate  Encounter for hepatitis C virus screening test for high risk patient - Plan: Hepatitis C antibody  Need for influenza vaccination - Plan: Flu vaccine HIGH DOSE PF (Fluzone High dose)  Family history of Alzheimer's disease - mom  History of ulcerative colitis [place] of concerns today that are new " hurting all over" and memory. Will get B12 level some inflammatory markers consideration of rheumatologic referral or consult consider PMR. See advice below. Flu shot today can delay on the tetanus discussed shingles vaccine. She is concerned about memory her mom had Alzheimer's discussed prevention if she is getting continued decline or concerns after the stress. Resolves consider neurologic consult.  Risk benefit of medication  nsaid and cox 2 inhdiscussed.  Patient Care Team: Panosh, Standley Brooking, MD as PCP - General Mauri Pole, MD as Consulting Physician (Gastroenterology)  Patient Instructions  Will notify you  of labs when available. Depending on your lab results week will consider getting rheumatologist consult about the cause of your body aches and joint aches. This could be arthritis and aging but want an opinion about other causes of your symptoms that may be treated differently. Meloxicam is like an anti-inflammatory Advil Aleve can increase swelling and high blood pressure but can help joint aches and body aches of inflammation. We can get neurology consult about the memory difficulty if it is ongoing and no obvious cause.    Preventive Care 76 Years and Older,  Female Preventive care refers to lifestyle choices and visits with your health care provider that can promote health and wellness. What does preventive care include?  A yearly physical exam. This is also called an annual well check.  Dental exams once or twice a year.  Routine eye exams. Ask your health care provider how often you should have your eyes checked.  Personal lifestyle choices, including: ? Daily care of your teeth and gums. ? Regular physical activity. ? Eating a healthy diet. ? Avoiding tobacco and drug use. ? Limiting alcohol use. ? Practicing safe sex. ? Taking low-dose aspirin every day. ? Taking  vitamin and mineral supplements as recommended by your health care provider. What happens during an annual well check? The services and screenings done by your health care provider during your annual well check will depend on your age, overall health, lifestyle risk factors, and family history of disease. Counseling Your health care provider may ask you questions about your:  Alcohol use.  Tobacco use.  Drug use.  Emotional well-being.  Home and relationship well-being.  Sexual activity.  Eating habits.  History of falls.  Memory and ability to understand (cognition).  Work and work Statistician.  Reproductive health.  Screening You may have the following tests or measurements:  Height, weight, and BMI.  Blood pressure.  Lipid and cholesterol levels. These may be checked every 5 years, or more frequently if you are over 20 years old.  Skin check.  Lung cancer screening. You may have this screening every year starting at age 29 if you have a 30-pack-year history of smoking and currently smoke or have quit within the past 15 years.  Fecal occult blood test (FOBT) of the stool. You may have this test every year starting at age 11.  Flexible sigmoidoscopy or colonoscopy. You may have a sigmoidoscopy every 5 years or a colonoscopy every 10 years  starting at age 53.  Hepatitis C blood test.  Hepatitis B blood test.  Sexually transmitted disease (STD) testing.  Diabetes screening. This is done by checking your blood sugar (glucose) after you have not eaten for a while (fasting). You may have this done every 1-3 years.  Bone density scan. This is done to screen for osteoporosis. You may have this done starting at age 74.  Mammogram. This may be done every 1-2 years. Talk to your health care provider about how often you should have regular mammograms.  Talk with your health care provider about your test results, treatment options, and if necessary, the need for more tests. Vaccines Your health care provider may recommend certain vaccines, such as:  Influenza vaccine. This is recommended every year.  Tetanus, diphtheria, and acellular pertussis (Tdap, Td) vaccine. You may need a Td booster every 10 years.  Varicella vaccine. You may need this if you have not been vaccinated.  Zoster vaccine. You may need this after age 74.  Measles, mumps, and rubella (MMR) vaccine. You may need at least one dose of MMR if you were born in 1957 or later. You may also need a second dose.  Pneumococcal 13-valent conjugate (PCV13) vaccine. One dose is recommended after age 48.  Pneumococcal polysaccharide (PPSV23) vaccine. One dose is recommended after age 35.  Meningococcal vaccine. You may need this if you have certain conditions.  Hepatitis A vaccine. You may need this if you have certain conditions or if you travel or work in places where you may be exposed to hepatitis A.  Hepatitis B vaccine. You may need this if you have certain conditions or if you travel or work in places where you may be exposed to hepatitis B.  Haemophilus influenzae type b (Hib) vaccine. You may need this if you have certain conditions.  Talk to your health care provider about which screenings and vaccines you need and how often you need them. This information is  not intended to replace advice given to you by your health care provider. Make sure you discuss any questions you have with your health care provider. Document Released: 09/28/2015 Document Revised: 05/21/2016 Document Reviewed: 07/03/2015 Elsevier Interactive Patient Education  2017 [employer].  Standley Brooking. Panosh M.D.

## 2017-06-02 ENCOUNTER — Encounter: Payer: Self-pay | Admitting: Internal Medicine

## 2017-06-02 ENCOUNTER — Ambulatory Visit (INDEPENDENT_AMBULATORY_CARE_PROVIDER_SITE_OTHER): Payer: Medicare Other | Admitting: Internal Medicine

## 2017-06-02 VITALS — BP 126/78 | HR 80 | Temp 97.9°F | Ht 61.25 in | Wt 161.2 lb

## 2017-06-02 DIAGNOSIS — Z8719 Personal history of other diseases of the digestive system: Secondary | ICD-10-CM | POA: Diagnosis not present

## 2017-06-02 DIAGNOSIS — R52 Pain, unspecified: Secondary | ICD-10-CM

## 2017-06-02 DIAGNOSIS — Z1159 Encounter for screening for other viral diseases: Secondary | ICD-10-CM | POA: Diagnosis not present

## 2017-06-02 DIAGNOSIS — E785 Hyperlipidemia, unspecified: Secondary | ICD-10-CM

## 2017-06-02 DIAGNOSIS — Z0000 Encounter for general adult medical examination without abnormal findings: Secondary | ICD-10-CM

## 2017-06-02 DIAGNOSIS — M2550 Pain in unspecified joint: Secondary | ICD-10-CM | POA: Diagnosis not present

## 2017-06-02 DIAGNOSIS — Z9189 Other specified personal risk factors, not elsewhere classified: Secondary | ICD-10-CM | POA: Diagnosis not present

## 2017-06-02 DIAGNOSIS — Z23 Encounter for immunization: Secondary | ICD-10-CM

## 2017-06-02 DIAGNOSIS — Z79899 Other long term (current) drug therapy: Secondary | ICD-10-CM | POA: Diagnosis not present

## 2017-06-02 DIAGNOSIS — R413 Other amnesia: Secondary | ICD-10-CM | POA: Diagnosis not present

## 2017-06-02 DIAGNOSIS — Z820 Family history of epilepsy and other diseases of the nervous system: Secondary | ICD-10-CM

## 2017-06-02 DIAGNOSIS — I10 Essential (primary) hypertension: Secondary | ICD-10-CM | POA: Diagnosis not present

## 2017-06-02 NOTE — Patient Instructions (Signed)
Will notify you  of labs when available. Depending on your lab results week will consider getting rheumatologist consult about the cause of your body aches and joint aches. This could be arthritis and aging but want an opinion about other causes of your symptoms that may be treated differently. Meloxicam is like an anti-inflammatory Advil Aleve can increase swelling and high blood pressure but can help joint aches and body aches of inflammation. We can get neurology consult about the memory difficulty if it is ongoing and no obvious cause.    Preventive Care 69 Years and Older, Female Preventive care refers to lifestyle choices and visits with your health care provider that can promote health and wellness. What does preventive care include?  A yearly physical exam. This is also called an annual well check.  Dental exams once or twice a year.  Routine eye exams. Ask your health care provider how often you should have your eyes checked.  Personal lifestyle choices, including: ? Daily care of your teeth and gums. ? Regular physical activity. ? Eating a healthy diet. ? Avoiding tobacco and drug use. ? Limiting alcohol use. ? Practicing safe sex. ? Taking low-dose aspirin every day. ? Taking vitamin and mineral supplements as recommended by your health care provider. What happens during an annual well check? The services and screenings done by your health care provider during your annual well check will depend on your age, overall health, lifestyle risk factors, and family history of disease. Counseling Your health care provider may ask you questions about your:  Alcohol use.  Tobacco use.  Drug use.  Emotional well-being.  Home and relationship well-being.  Sexual activity.  Eating habits.  History of falls.  Memory and ability to understand (cognition).  Work and work Statistician.  Reproductive health.  Screening You may have the following tests or  measurements:  Height, weight, and BMI.  Blood pressure.  Lipid and cholesterol levels. These may be checked every 5 years, or more frequently if you are over 69 years old.  Skin check.  Lung cancer screening. You may have this screening every year starting at age 55 if you have a 30-pack-year history of smoking and currently smoke or have quit within the past 15 years.  Fecal occult blood test (FOBT) of the stool. You may have this test every year starting at age 55.  Flexible sigmoidoscopy or colonoscopy. You may have a sigmoidoscopy every 5 years or a colonoscopy every 10 years starting at age 25.  Hepatitis C blood test.  Hepatitis B blood test.  Sexually transmitted disease (STD) testing.  Diabetes screening. This is done by checking your blood sugar (glucose) after you have not eaten for a while (fasting). You may have this done every 1-3 years.  Bone density scan. This is done to screen for osteoporosis. You may have this done starting at age 69.  Mammogram. This may be done every 1-2 years. Talk to your health care provider about how often you should have regular mammograms.  Talk with your health care provider about your test results, treatment options, and if necessary, the need for more tests. Vaccines Your health care provider may recommend certain vaccines, such as:  Influenza vaccine. This is recommended every year.  Tetanus, diphtheria, and acellular pertussis (Tdap, Td) vaccine. You may need a Td booster every 10 years.  Varicella vaccine. You may need this if you have not been vaccinated.  Zoster vaccine. You may need this after age 73.  Measles,  mumps, and rubella (MMR) vaccine. You may need at least one dose of MMR if you were born in 1957 or later. You may also need a second dose.  Pneumococcal 13-valent conjugate (PCV13) vaccine. One dose is recommended after age 33.  Pneumococcal polysaccharide (PPSV23) vaccine. One dose is recommended after age  11.  Meningococcal vaccine. You may need this if you have certain conditions.  Hepatitis A vaccine. You may need this if you have certain conditions or if you travel or work in places where you may be exposed to hepatitis A.  Hepatitis B vaccine. You may need this if you have certain conditions or if you travel or work in places where you may be exposed to hepatitis B.  Haemophilus influenzae type b (Hib) vaccine. You may need this if you have certain conditions.  Talk to your health care provider about which screenings and vaccines you need and how often you need them. This information is not intended to replace advice given to you by your health care provider. Make sure you discuss any questions you have with your health care provider. Document Released: 09/28/2015 Document Revised: 05/21/2016 Document Reviewed: 07/03/2015 Elsevier Interactive Patient Education  2017 [employer].

## 2017-06-03 LAB — CBC WITH DIFFERENTIAL/PLATELET
BASOS PCT: 0.8 % (ref 0.0–3.0)
Basophils Absolute: 0.1 10*3/uL (ref 0.0–0.1)
EOS PCT: 5.7 % — AB (ref 0.0–5.0)
Eosinophils Absolute: 0.5 10*3/uL (ref 0.0–0.7)
HCT: 39.6 % (ref 36.0–46.0)
Hemoglobin: 13.1 g/dL (ref 12.0–15.0)
LYMPHS ABS: 2.5 10*3/uL (ref 0.7–4.0)
Lymphocytes Relative: 26.3 % (ref 12.0–46.0)
MCHC: 33 g/dL (ref 30.0–36.0)
MCV: 98 fl (ref 78.0–100.0)
MONO ABS: 0.9 10*3/uL (ref 0.1–1.0)
MONOS PCT: 9.3 % (ref 3.0–12.0)
NEUTROS PCT: 57.9 % (ref 43.0–77.0)
Neutro Abs: 5.6 10*3/uL (ref 1.4–7.7)
PLATELETS: 276 10*3/uL (ref 150.0–400.0)
RBC: 4.04 Mil/uL (ref 3.87–5.11)
RDW: 13.8 % (ref 11.5–15.5)
WBC: 9.7 10*3/uL (ref 4.0–10.5)

## 2017-06-03 LAB — TSH: TSH: 1.6 u[IU]/mL (ref 0.35–4.50)

## 2017-06-03 LAB — C-REACTIVE PROTEIN: CRP: 0.5 mg/dL (ref 0.5–20.0)

## 2017-06-03 LAB — SEDIMENTATION RATE: SED RATE: 8 mm/h (ref 0–30)

## 2017-06-03 LAB — BASIC METABOLIC PANEL
BUN: 18 mg/dL (ref 6–23)
CHLORIDE: 98 meq/L (ref 96–112)
CO2: 27 mEq/L (ref 19–32)
Calcium: 9.7 mg/dL (ref 8.4–10.5)
Creatinine, Ser: 0.76 mg/dL (ref 0.40–1.20)
GFR: 80.04 mL/min (ref >60.00)
GLUCOSE: 90 mg/dL (ref 70–99)
POTASSIUM: 3.8 meq/L (ref 3.5–5.1)
Sodium: 136 mEq/L (ref 135–145)

## 2017-06-03 LAB — HEPATIC FUNCTION PANEL
ALK PHOS: 42 U/L (ref 39–117)
ALT: 17 U/L (ref 0–35)
AST: 24 U/L (ref 0–37)
Albumin: 4.5 g/dL (ref 3.5–5.2)
BILIRUBIN DIRECT: 0.1 mg/dL (ref 0.0–0.3)
Total Bilirubin: 0.7 mg/dL (ref 0.2–1.2)
Total Protein: 7.1 g/dL (ref 6.0–8.3)

## 2017-06-03 LAB — LIPID PANEL
CHOL/HDL RATIO: 4
Cholesterol: 250 mg/dL — ABNORMAL HIGH (ref 0–200)
HDL: 71.3 mg/dL (ref >39.00)
LDL Cholesterol: 165 mg/dL — ABNORMAL HIGH (ref 0–99)
NONHDL: 178.42
Triglycerides: 66 mg/dL (ref 0.0–149.0)
VLDL: 13.2 mg/dL (ref 0.0–40.0)

## 2017-06-03 LAB — HEPATITIS C ANTIBODY
HEP C AB: NONREACTIVE
SIGNAL TO CUT-OFF: 0.03 (ref <1.00)

## 2017-06-03 LAB — CYCLIC CITRUL PEPTIDE ANTIBODY, IGG

## 2017-06-03 LAB — CK: CK TOTAL: 155 U/L (ref 7–177)

## 2017-06-03 LAB — EXTRA LAV TOP TUBE

## 2017-06-03 LAB — VITAMIN B12: Vitamin B-12: 754 pg/mL (ref 211–911)

## 2017-06-03 LAB — T4, FREE: FREE T4: 1.04 ng/dL (ref 0.60–1.60)

## 2017-06-16 ENCOUNTER — Telehealth: Payer: Self-pay | Admitting: Internal Medicine

## 2017-06-16 NOTE — Telephone Encounter (Signed)
Pt is calling stating that has broke out with pimps all over her face and would like to see if Dr. Regis Bill would prescribe Rx meronidazole topical gel .75.  Pharm:  [facility]

## 2017-06-17 MED ORDER — METRONIDAZOLE 0.75 % EX GEL: 1.0000 "application " | Freq: Two times a day (BID) | CUTANEOUS | 1 refills | Status: DC

## 2017-06-17 NOTE — Telephone Encounter (Signed)
Ok to do this for rosacea  I sent this in  Tell patient

## 2017-06-17 NOTE — Telephone Encounter (Signed)
Left message on machine to call back  

## 2017-06-18 NOTE — Telephone Encounter (Signed)
Spoke with pt and she states pharmacy alerted her. She had no additional questions at this time. Nothing further is needed

## 2017-06-28 ENCOUNTER — Other Ambulatory Visit: Payer: Self-pay | Admitting: Internal Medicine

## 2017-09-18 ENCOUNTER — Other Ambulatory Visit: Payer: Self-pay

## 2017-09-18 ENCOUNTER — Ambulatory Visit (AMBULATORY_SURGERY_CENTER): Payer: Self-pay | Admitting: *Deleted

## 2017-09-18 VITALS — Ht 63.0 in | Wt 153.0 lb

## 2017-09-18 DIAGNOSIS — K51811 Other ulcerative colitis with rectal bleeding: Secondary | ICD-10-CM

## 2017-09-18 DIAGNOSIS — K219 Gastro-esophageal reflux disease without esophagitis: Secondary | ICD-10-CM

## 2017-09-18 MED ORDER — NA SULFATE-K SULFATE-MG SULF 17.5-3.13-1.6 GM/177ML PO SOLN: 1.0000 | Freq: Once | ORAL | 0 refills | Status: AC

## 2017-09-18 NOTE — Progress Notes (Signed)
No egg or soy allergy known to patient  No issues with past sedation with any surgeries  or procedures, no intubation problems  No diet pills per patient No home 02 use per patient  No blood thinners per patient  Pt denies issues with constipation  No A fib or A flutter  EMMI video sent to pt's e mail -

## 2017-09-21 ENCOUNTER — Encounter: Payer: Self-pay | Admitting: Gastroenterology

## 2017-09-25 ENCOUNTER — Encounter: Payer: Self-pay | Admitting: Gastroenterology

## 2017-09-25 ENCOUNTER — Other Ambulatory Visit: Payer: Self-pay

## 2017-09-25 ENCOUNTER — Telehealth: Payer: Self-pay | Admitting: Gastroenterology

## 2017-09-25 ENCOUNTER — Ambulatory Visit (AMBULATORY_SURGERY_CENTER): Payer: Medicare Other | Admitting: Gastroenterology

## 2017-09-25 VITALS — BP 115/73 | HR 85 | Temp 98.2°F | Resp 16 | Ht 63.0 in | Wt 153.0 lb

## 2017-09-25 DIAGNOSIS — K219 Gastro-esophageal reflux disease without esophagitis: Secondary | ICD-10-CM | POA: Diagnosis not present

## 2017-09-25 DIAGNOSIS — K5100 Ulcerative (chronic) pancolitis without complications: Secondary | ICD-10-CM | POA: Diagnosis present

## 2017-09-25 DIAGNOSIS — K5140 Inflammatory polyps of colon without complications: Secondary | ICD-10-CM | POA: Diagnosis not present

## 2017-09-25 DIAGNOSIS — K5190 Ulcerative colitis, unspecified, without complications: Secondary | ICD-10-CM | POA: Diagnosis not present

## 2017-09-25 MED ORDER — SODIUM CHLORIDE 0.9 % IV SOLN: 500.0000 mL | INTRAVENOUS | Status: DC

## 2017-09-25 NOTE — Op Note (Signed)
[facility] Patient Name: Shelley Wright Procedure Date: 09/25/2017 9:17 AM MRN: [id number] Endoscopist: Mauri Pole , MD Age: 72 Referring MD:  Date of Birth: June 05, 1946 Gender: Female Account #: [id number] Procedure:                Colonoscopy Indications:              High risk colon cancer surveillance: Ulcerative                            pancolitis of 8 (or more) years duration Medicines:                Monitored Anesthesia Care Procedure:                Pre-Anesthesia Assessment:                           - Prior to the procedure, a History and Physical                            was performed, and patient medications and                            allergies were reviewed. The patient's tolerance of                            previous anesthesia was also reviewed. The risks                            and benefits of the procedure and the sedation                            options and risks were discussed with the patient.                            All questions were answered, and informed consent                            was obtained. Prior Anticoagulants: The patient has                            taken no previous anticoagulant or antiplatelet                            agents. ASA Grade Assessment: II - A patient with                            mild systemic disease. After reviewing the risks                            and benefits, the patient was deemed in                            satisfactory condition to undergo the procedure.  After obtaining informed consent, the colonoscope                            was passed under direct vision. Throughout the                            procedure, the patient's blood pressure, pulse, and                            oxygen saturations were monitored continuously. The                            Colonoscope was introduced through the anus and                            advanced to the the  cecum, identified by                            appendiceal orifice and ileocecal valve. The                            colonoscopy was performed without difficulty. The                            patient tolerated the procedure well. The quality                            of the bowel preparation was good. The ileocecal                            valve, appendiceal orifice, and rectum were                            photographed. Scope In: 9:29:37 AM Scope Out: 1:61:09 AM Scope Withdrawal Time: 0 hours 18 minutes 57 seconds  Total Procedure Duration: 0 hours 27 minutes 20 seconds  Findings:                 The perianal and digital rectal examinations were                            normal.                           Inflammation characterized by altered vascularity,                            loss of vascularity, pseudopolyps and scarring was                            found in a continuous and circumferential pattern                            from the anus to the cecum. No sites were spared.  Biopsies were taken with a cold forceps for                            histology from cecum, ascending colon, transverse                            colon, descending colon, sigmoid colon and rectum.                            To prevent bleeding after the biopsy at one of the                            sites in descending colon that was oozing briskly,                            one hemostatic clip was successfully placed (MR                            conditional). There was no bleeding at the end of                            the procedure.                           A few small and large-mouthed diverticula were                            found in the sigmoid colon. Complications:            No immediate complications. Estimated Blood Loss:     Estimated blood loss was minimal. Impression:               - Pancolitis. Inflammation was found from the anus                             to the cecum. Biopsied. Clip (MR conditional) was                            placed.                           - Diverticulosis in the sigmoid colon. Recommendation:           - Patient has a contact number available for                            emergencies. The signs and symptoms of potential                            delayed complications were discussed with the                            patient. Return to normal activities tomorrow.  Written discharge instructions were provided to the                            patient.                           - Resume previous diet.                           - Continue present medications.                           - No aspirin, ibuprofen, naproxen, or other                            non-steroidal anti-inflammatory drugs for 2 weeks.                           - Await pathology results.                           - Repeat colonoscopy date to be determined after                            pending pathology results are reviewed for                            surveillance based on pathology results.                           - Return to GI clinic at the next available                            appointment. Mauri Pole, MD 09/25/2017 10:09:33 AM This report has been signed electronically.

## 2017-09-25 NOTE — Patient Instructions (Addendum)
YOU HAD AN ENDOSCOPIC PROCEDURE TODAY AT [facility]:   Refer to the procedure report that was given to you for any specific questions about what was found during the examination.  If the procedure report does not answer your questions, please call your gastroenterologist to clarify.  If you requested that your care partner not be given the details of your procedure findings, then the procedure report has been included in a sealed envelope for you to review at your convenience later.  YOU SHOULD EXPECT: Some feelings of bloating in the abdomen. Passage of more gas than usual.  Walking can help get rid of the air that was put into your GI tract during the procedure and reduce the bloating. If you had a lower endoscopy (such as a colonoscopy or flexible sigmoidoscopy) you may notice spotting of blood in your stool or on the toilet paper. If you underwent a bowel prep for your procedure, you may not have a normal bowel movement for a few days.  Please Note:  You might notice some irritation and congestion in your nose or some drainage.  This is from the oxygen used during your procedure.  There is no need for concern and it should clear up in a day or so.  SYMPTOMS TO REPORT IMMEDIATELY:   Following lower endoscopy (colonoscopy or flexible sigmoidoscopy):  Excessive amounts of blood in the stool  Significant tenderness or worsening of abdominal pains  Swelling of the abdomen that is new, acute  Fever of 100F or higher   For urgent or emergent issues, a gastroenterologist can be reached at any hour by calling [phone].   DIET:  We do recommend a small meal at first, but then you may proceed to your regular diet.  Drink plenty of fluids but you should avoid alcoholic beverages for 24 hours.  ACTIVITY:  You should plan to take it easy for the rest of today and you should NOT DRIVE or use heavy machinery until tomorrow (because of the sedation medicines used during the test).     FOLLOW UP: Our staff will call the number listed on your records the next business day following your procedure to check on you and address any questions or concerns that you may have regarding the information given to you following your procedure. If we do not reach you, we will leave a message.  However, if you are feeling well and you are not experiencing any problems, there is no need to return our call.  We will assume that you have returned to your regular daily activities without incident.  If any biopsies were taken you will be contacted by phone or by letter within the next 1-3 weeks.  Please call us at [phone] if you have not heard about the biopsies in 3 weeks.    SIGNATURES/CONFIDENTIALITY: You and/or your care partner have signed paperwork which will be entered into your electronic medical record.  These signatures attest to the fact that that the information above on your After Visit Summary has been reviewed and is understood.  Full responsibility of the confidentiality of this discharge information lies with you and/or your care-partner.  Polyp and diverticulosis information given.  Card given with clip information.  No aspirin, ibuprofen, naproxen or other non steroidal anti-inflammatory meds for 2 weeks.  See Dr. Silverio Decamp at next available appointment.  Folic acid 1 mg. Daily, can purchase OTC  Follow- Dr. Silverio Decamp , March 11th at [facility]

## 2017-09-25 NOTE — Op Note (Signed)
[place] Patient Name: Shelley Wright Procedure Date: 09/25/2017 9:17 AM MRN: [id number] Endoscopist: Mauri Pole , MD Age: 72 Referring MD:  Date of Birth: February 24, 1946 Gender: Female Account #: [id number] Procedure:                Upper GI endoscopy Indications:              Heartburn, Esophageal reflux symptoms that persist                            despite appropriate therapy Medicines:                Monitored Anesthesia Care Procedure:                Pre-Anesthesia Assessment:                           - Prior to the procedure, a History and Physical                            was performed, and patient medications and                            allergies were reviewed. The patient's tolerance of                            previous anesthesia was also reviewed. The risks                            and benefits of the procedure and the sedation                            options and risks were discussed with the patient.                            All questions were answered, and informed consent                            was obtained. Prior Anticoagulants: The patient has                            taken no previous anticoagulant or antiplatelet                            agents. ASA Grade Assessment: II - A patient with                            mild systemic disease. After reviewing the risks                            and benefits, the patient was deemed in                            satisfactory condition to undergo the procedure.  After obtaining informed consent, the endoscope was                            passed under direct vision. Throughout the                            procedure, the patient's blood pressure, pulse, and                            oxygen saturations were monitored continuously. The                            Model GIF-[id number]) scope was introduced                            through the mouth, and  advanced to the second part                            of duodenum. The upper GI endoscopy was                            accomplished without difficulty. The patient                            tolerated the procedure well. Scope In: Scope Out: Findings:                 Esophagogastric landmarks were identified: the                            Z-line was found at 35 cm , was regular and the                            gastroesophageal junction was found at 35 cm from                            the incisors.                           Islands X2 of salmon-colored mucosa were present at                            20-24 cm cm. No other visible abnormalities were                            present. The maximum longitudinal extent of these                            esophageal mucosal changes was 4 cm in length.                           The stomach was normal.                           The examined duodenum was  normal. Complications:            No immediate complications. Estimated Blood Loss:     Estimated blood loss: none. Impression:               - Esophagogastric landmarks identified.                           - Salmon-colored mucosa in the proximal esophagus                            consistent with inlet patch.                           - Normal stomach.                           - Normal examined duodenum.                           - No specimens collected. Recommendation:           - Patient has a contact number available for                            emergencies. The signs and symptoms of potential                            delayed complications were discussed with the                            patient. Return to normal activities tomorrow.                            Written discharge instructions were provided to the                            patient.                           - Resume previous diet.                           - Continue present medications.                            - See the other procedure note for documentation of                            additional recommendations. Mauri Pole, MD 09/25/2017 10:03:29 AM This report has been signed electronically.

## 2017-09-25 NOTE — Telephone Encounter (Signed)
Left a message asking the patient to confirm her pharmacy. I will send the folic acid as a prescription. And sorry for the inconvenience. Thank you.

## 2017-09-25 NOTE — Progress Notes (Signed)
Called to room to assist during endoscopic procedure.  Patient ID and intended procedure confirmed with present staff. Received instructions for my participation in the procedure from the performing physician.  

## 2017-09-25 NOTE — Progress Notes (Signed)
Report to PACU, RN, vss, BBS= Clear.  

## 2017-09-28 NOTE — Telephone Encounter (Signed)
  Follow up Call-  Call back number 09/25/2017 09/21/2015  Post procedure Call Back phone  # [phone]  Permission to leave phone message Yes Yes  Some recent data might be hidden     Patient questions:  Do you have a fever, pain , or abdominal swelling? No. Pain Score  0 *  Have you tolerated food without any problems? Yes.    Have you been able to return to your normal activities? Yes.    Do you have any questions about your discharge instructions: Diet   No. Medications  Yes.  Patient asked about discrepency in folic acid prescription. MD called in 1 mg. Pharmacy did not  Have that dose. Follow up visit  No.  Do you have questions or concerns about your Care? No.  Actions: * If pain score is 4 or above: No action needed, pain <4.

## 2017-09-30 ENCOUNTER — Encounter: Payer: Self-pay | Admitting: Gastroenterology

## 2017-11-23 ENCOUNTER — Ambulatory Visit: Payer: Medicare Other | Admitting: Gastroenterology

## 2018-01-19 DIAGNOSIS — Z1231 Encounter for screening mammogram for malignant neoplasm of breast: Secondary | ICD-10-CM | POA: Diagnosis not present

## 2018-01-19 DIAGNOSIS — Z803 Family history of malignant neoplasm of breast: Secondary | ICD-10-CM | POA: Diagnosis not present

## 2018-01-19 LAB — HM MAMMOGRAPHY

## 2018-01-31 ENCOUNTER — Other Ambulatory Visit: Payer: Self-pay | Admitting: Internal Medicine

## 2018-02-10 ENCOUNTER — Encounter: Payer: Self-pay | Admitting: Internal Medicine

## 2018-03-16 ENCOUNTER — Encounter: Payer: Self-pay | Admitting: Internal Medicine

## 2018-03-16 ENCOUNTER — Ambulatory Visit (INDEPENDENT_AMBULATORY_CARE_PROVIDER_SITE_OTHER): Payer: Medicare Other | Admitting: Internal Medicine

## 2018-03-16 VITALS — BP 103/79 | HR 89 | Temp 98.4°F | Wt 151.0 lb

## 2018-03-16 DIAGNOSIS — R194 Change in bowel habit: Secondary | ICD-10-CM | POA: Diagnosis not present

## 2018-03-16 DIAGNOSIS — E785 Hyperlipidemia, unspecified: Secondary | ICD-10-CM

## 2018-03-16 DIAGNOSIS — Z79899 Other long term (current) drug therapy: Secondary | ICD-10-CM | POA: Diagnosis not present

## 2018-03-16 DIAGNOSIS — R109 Unspecified abdominal pain: Secondary | ICD-10-CM | POA: Diagnosis not present

## 2018-03-16 DIAGNOSIS — I10 Essential (primary) hypertension: Secondary | ICD-10-CM

## 2018-03-16 DIAGNOSIS — Z8719 Personal history of other diseases of the digestive system: Secondary | ICD-10-CM | POA: Diagnosis not present

## 2018-03-16 LAB — LIPID PANEL
CHOLESTEROL: 296 mg/dL — AB (ref 0–200)
HDL: 65.3 mg/dL (ref >39.00)
LDL CALC: 210 mg/dL — AB (ref 0–99)
NonHDL: 230.36
Total CHOL/HDL Ratio: 5
Triglycerides: 103 mg/dL (ref 0.0–149.0)
VLDL: 20.6 mg/dL (ref 0.0–40.0)

## 2018-03-16 LAB — HEPATIC FUNCTION PANEL
ALK PHOS: 40 U/L (ref 39–117)
ALT: 18 U/L (ref 0–35)
AST: 24 U/L (ref 0–37)
Albumin: 4.3 g/dL (ref 3.5–5.2)
BILIRUBIN TOTAL: 0.6 mg/dL (ref 0.2–1.2)
Bilirubin, Direct: 0.1 mg/dL (ref 0.0–0.3)
Total Protein: 6.8 g/dL (ref 6.0–8.3)

## 2018-03-16 LAB — BASIC METABOLIC PANEL
BUN: 20 mg/dL (ref 6–23)
CALCIUM: 9.6 mg/dL (ref 8.4–10.5)
CO2: 32 mEq/L (ref 19–32)
Chloride: 100 mEq/L (ref 96–112)
Creatinine, Ser: 0.72 mg/dL (ref 0.40–1.20)
GFR: 85 mL/min (ref >60.00)
GLUCOSE: 102 mg/dL — AB (ref 70–99)
POTASSIUM: 4.3 meq/L (ref 3.5–5.1)
Sodium: 138 mEq/L (ref 135–145)

## 2018-03-16 LAB — POC URINALSYSI DIPSTICK (AUTOMATED)
BILIRUBIN UA: NEGATIVE
GLUCOSE UA: NEGATIVE
Ketones, UA: NEGATIVE
Leukocytes, UA: NEGATIVE
Nitrite, UA: NEGATIVE
Protein, UA: NEGATIVE
RBC UA: NEGATIVE
SPEC GRAV UA: 1.025 (ref 1.010–1.025)
Urobilinogen, UA: 0.2 E.U./dL
pH, UA: 6 (ref 5.0–8.0)

## 2018-03-16 LAB — CBC WITH DIFFERENTIAL/PLATELET
Basophils Absolute: 0.1 10*3/uL (ref 0.0–0.1)
Basophils Relative: 0.7 % (ref 0.0–3.0)
EOS PCT: 4.4 % (ref 0.0–5.0)
Eosinophils Absolute: 0.3 10*3/uL (ref 0.0–0.7)
HCT: 40 % (ref 36.0–46.0)
Hemoglobin: 13.3 g/dL (ref 12.0–15.0)
LYMPHS ABS: 2 10*3/uL (ref 0.7–4.0)
Lymphocytes Relative: 26.5 % (ref 12.0–46.0)
MCHC: 33.2 g/dL (ref 30.0–36.0)
MCV: 97.9 fl (ref 78.0–100.0)
MONO ABS: 0.7 10*3/uL (ref 0.1–1.0)
MONOS PCT: 8.8 % (ref 3.0–12.0)
NEUTROS ABS: 4.5 10*3/uL (ref 1.4–7.7)
NEUTROS PCT: 59.6 % (ref 43.0–77.0)
PLATELETS: 240 10*3/uL (ref 150.0–400.0)
RBC: 4.09 Mil/uL (ref 3.87–5.11)
RDW: 14.5 % (ref 11.5–15.5)
WBC: 7.6 10*3/uL (ref 4.0–10.5)

## 2018-03-16 LAB — TSH: TSH: 2.7 u[IU]/mL (ref 0.35–4.50)

## 2018-03-16 LAB — C-REACTIVE PROTEIN: CRP: 0.5 mg/dL (ref 0.5–20.0)

## 2018-03-16 NOTE — Patient Instructions (Addendum)
  This is  prob GI related .    And I want you to see dr Silverio Decamp .  Lab today and  Depending  consideration of other eval .   Pelvic etc .  ultrasound etc .   Keep  appt in September  And earlie

## 2018-03-16 NOTE — Progress Notes (Signed)
Chief Complaint  Patient presents with  . Abdominal Pain    HPI: Shelley Wright 72 y.o. come in for COne months of dull pain lower abdominal discomfort associated with change in bowel habits relieved by defecation.  No blood in her stool vomiting weight loss fever and chills.  For a month  At night   Subtle .   Cramp after bending down  Is not sure if it is important but it is different for her.  No urinary symptoms.  No history of abnormal Pap smears or GYN problems she has not had a hysterectomy. Has dx of UC  And  Sx gone after bm    Hx of colonscopy was ok  And polyps  nadicam .  Advising  Every year.  Father    ROS: See pertinent positives and negatives per HPI. She is on blood pressure medicine HLD medicine no side effects due for lab monitoring in the next month or so Last Pap long time ago no vaginal bleeding.  Past Medical History:  Diagnosis Date  . Anxiety   . Arthritis   . GERD (gastroesophageal reflux disease)   . History of smoking   . History of ulcerative colitis 2005   pancolitis   . Hyperlipidemia   . Hypertension   . Normal nuclear stress test 2008   myoview     Family History  Problem Relation Age of Onset  . Pancreatic cancer Father        deceased  . Hypertension Father   . Breast cancer Mother 22  . Angina Mother   . Hypertension Mother   . Colon cancer Neg Hx   . Colon polyps Neg Hx   . Rectal cancer Neg Hx   . Stomach cancer Neg Hx     Social History   Socioeconomic History  . Marital status: Married    Spouse name: Not on file  . Number of children: 4  . Years of education: Not on file  . Highest education level: Not on file  Occupational History  . Not on file  Social Needs  . Financial resource strain: Not on file  . Food insecurity:    Worry: Not on file    Inability: Not on file  . Transportation needs:    Medical: Not on file    Non-medical: Not on file  Tobacco Use  . Smoking status: Former Smoker    Last attempt to  quit: 07/04/2014    Years since quitting: 3.7  . Smokeless tobacco: Never Used  Substance and Sexual Activity  . Alcohol use: No    Alcohol/week: 0.0 oz  . Drug use: No  . Sexual activity: Not on file  Lifestyle  . Physical activity:    Days per week: Not on file    Minutes per session: Not on file  . Stress: Not on file  Relationships  . Social connections:    Talks on phone: Not on file    Gets together: Not on file    Attends religious service: Not on file    Active member of club or organization: Not on file    Attends meetings of clubs or organizations: Not on file    Relationship status: Not on file  Other Topics Concern  . Not on file  Social History Narrative   Hh  Of 2    Pet dog    Off tobacco.   [employer].     Teacher runs school.  37 - 3    G4 P4    Husband had cva and blocked carotids     Outpatient Medications Prior to Visit  Medication Sig Dispense Refill  . atorvastatin (LIPITOR) 20 MG tablet TAKE 1 TABLET BY MOUTH  DAILY 90 tablet 0  . escitalopram (LEXAPRO) 10 MG tablet TAKE 1/2 TO 1 TABLET BY  MOUTH EVERY DAY 90 tablet 0  . lisinopril-hydrochlorothiazide (PRINZIDE,ZESTORETIC) 20-12.5 MG tablet TAKE 1 TABLET BY MOUTH  DAILY 90 tablet 0  . metroNIDAZOLE (METROGEL) 0.75 % gel Apply 1 application topically 2 (two) times daily. 45 g 1  . sulfaSALAzine (AZULFIDINE) 500 MG EC tablet Take 2 tablets (1,000 mg total) by mouth 2 (two) times daily. 120 tablet 6  . meloxicam (MOBIC) 15 MG tablet Take 15 mg by mouth daily.     . mesalamine (LIALDA) 1.2 g EC tablet Take 1.2 g by mouth daily with breakfast.     . ranitidine (ZANTAC) 150 MG tablet Take 1 tablet (150 mg total) by mouth 2 (two) times daily. (Patient not taking: Reported on 06/02/2017) 60 tablet 3   Facility-Administered Medications Prior to Visit  Medication Dose Route Frequency Provider Last Rate Last Dose  . 0.9 %  sodium chloride infusion  500 mL Intravenous Continuous Nandigam, Kavitha V, MD          EXAM:  BP 103/79   Pulse 89   Temp 98.4 F (36.9 C)   Wt 151 lb (68.5 kg)   BMI 26.75 kg/m   Body mass index is 26.75 kg/m.  GENERAL: vitals reviewed and listed above, alert, oriented, appears well hydrated and in no acute distress HEENT: atraumatic, conjunctiva  clear, no obvious abnormalities on inspection of external nose and ears  NECK: no obvious masses on inspection palpation  LUNGS: clear to auscultation bilaterally, no wheezes, rales or rhonchi, good air movement CV: HRRR, no clubbing cyanosis or  peripheral edema nl cap refill  Abdomen soft without organomegaly guarding rebound and no CVA tenderness bowel sounds are present no focal tenderness. MS: moves all extremities without noticeable focal  abnormality PSYCH: pleasant and cooperative, no obvious depression or anxiety Lab Results  Component Value Date   WBC 7.6 03/16/2018   HGB 13.3 03/16/2018   HCT 40.0 03/16/2018   PLT 240.0 03/16/2018   GLUCOSE 102 (H) 03/16/2018   CHOL 296 (H) 03/16/2018   TRIG 103.0 03/16/2018   HDL 65.30 03/16/2018   LDLDIRECT 138.5 10/17/2013   LDLCALC 210 (H) 03/16/2018   ALT 18 03/16/2018   AST 24 03/16/2018   NA 138 03/16/2018   K 4.3 03/16/2018   CL 100 03/16/2018   CREATININE 0.72 03/16/2018   BUN 20 03/16/2018   CO2 32 03/16/2018   TSH 2.70 03/16/2018   BP Readings from Last 3 Encounters:  03/16/18 103/79  09/25/17 115/73  06/02/17 126/78   Wt Readings from Last 3 Encounters:  03/16/18 151 lb (68.5 kg)  09/25/17 153 lb (69.4 kg)  09/18/17 153 lb (69.4 kg)    ASSESSMENT AND PLAN:  Discussed the following assessment and plan:  Abdominal discomfort - Plan: POCT Urinalysis Dipstick (Automated), Basic metabolic panel, CBC with Differential/Platelet, Hepatic function panel, Lipid panel, TSH, C-reactive protein  Essential hypertension - Plan: Basic metabolic panel, CBC with Differential/Platelet, Hepatic function panel, Lipid panel, TSH, C-reactive  protein  Medication management - Plan: Basic metabolic panel, CBC with Differential/Platelet, Hepatic function panel, Lipid panel, TSH, C-reactive protein  Hyperlipidemia, unspecified hyperlipidemia type - Plan: Basic metabolic  panel, CBC with Differential/Platelet, Hepatic function panel, Lipid panel, TSH, C-reactive protein  History of ulcerative colitis - Plan: Basic metabolic panel, CBC with Differential/Platelet, Hepatic function panel, Lipid panel, TSH, C-reactive protein  Change in bowel habit - Plan: Basic metabolic panel, CBC with Differential/Platelet, Hepatic function panel, Lipid panel, TSH, C-reactive protein Suspect this is a GI symptom unclear significance have her see Dr. Augusto Garbe, lab monitoring today consideration of pelvic ultrasound and physical exam planned in the future.  She will get back to [place] after GI evaluation. -Patient advised to return or notify health care team  if  new concerns arise.  Patient Instructions   This is  prob GI related .    And I want you to see dr Silverio Decamp .  Lab today and  Depending  consideration of other eval .   Pelvic etc .  ultrasound etc .   Keep  appt in September  And Cleatis Polka. Panosh M.D.

## 2018-03-17 ENCOUNTER — Telehealth: Payer: Self-pay

## 2018-03-17 ENCOUNTER — Other Ambulatory Visit: Payer: Self-pay | Admitting: Internal Medicine

## 2018-03-17 NOTE — Telephone Encounter (Signed)
Spoke with the patient. She states this week and next "are not good for me." Scheduled her with Tye Savoy 03/30/18 at 2:30 pm. She accepts this appointment.

## 2018-03-17 NOTE — Telephone Encounter (Signed)
-----   Message from Mauri Pole, MD sent at 03/17/2018  5:52 AM EDT ----- Will try get her in soon for appointment. Thank you.   ----- Message ----- From: Burnis Medin, MD Sent: 03/16/2018   7:24 PM To: Mauri Pole, MD  Have asked her to make appt with you  Thanks [facility]

## 2018-03-23 ENCOUNTER — Telehealth: Payer: Self-pay | Admitting: Family Medicine

## 2018-03-23 MED ORDER — METRONIDAZOLE 0.75 % EX GEL: 1.0000 "application " | Freq: Two times a day (BID) | CUTANEOUS | 1 refills | Status: DC

## 2018-03-23 NOTE — Telephone Encounter (Signed)
Ok to refill 90 days and refillx 2

## 2018-03-23 NOTE — Telephone Encounter (Signed)
Refill request for Metronidazole gel and a 90 day supply to [facility].

## 2018-03-23 NOTE — Telephone Encounter (Signed)
I sent script e-scribe to [facility].

## 2018-03-23 NOTE — Telephone Encounter (Signed)
Can we refill this and if so how much to order for a 90 day supply?

## 2018-03-30 ENCOUNTER — Ambulatory Visit: Payer: Medicare Other | Admitting: Nurse Practitioner

## 2018-03-31 ENCOUNTER — Other Ambulatory Visit: Payer: Self-pay | Admitting: Family Medicine

## 2018-03-31 DIAGNOSIS — E7800 Pure hypercholesterolemia, unspecified: Secondary | ICD-10-CM

## 2018-03-31 MED ORDER — ATORVASTATIN CALCIUM 40 MG PO TABS: 40.0000 mg | ORAL_TABLET | Freq: Every day | ORAL | 1 refills | Status: DC

## 2018-06-04 ENCOUNTER — Ambulatory Visit (INDEPENDENT_AMBULATORY_CARE_PROVIDER_SITE_OTHER): Payer: Medicare Other | Admitting: Internal Medicine

## 2018-06-04 ENCOUNTER — Encounter: Payer: Self-pay | Admitting: Internal Medicine

## 2018-06-04 VITALS — BP 100/66 | HR 82 | Temp 97.7°F | Ht 61.5 in | Wt 152.6 lb

## 2018-06-04 DIAGNOSIS — I10 Essential (primary) hypertension: Secondary | ICD-10-CM | POA: Diagnosis not present

## 2018-06-04 DIAGNOSIS — Z0000 Encounter for general adult medical examination without abnormal findings: Secondary | ICD-10-CM

## 2018-06-04 DIAGNOSIS — Z79899 Other long term (current) drug therapy: Secondary | ICD-10-CM | POA: Diagnosis not present

## 2018-06-04 DIAGNOSIS — Z23 Encounter for immunization: Secondary | ICD-10-CM | POA: Diagnosis not present

## 2018-06-04 DIAGNOSIS — R739 Hyperglycemia, unspecified: Secondary | ICD-10-CM

## 2018-06-04 DIAGNOSIS — I83893 Varicose veins of bilateral lower extremities with other complications: Secondary | ICD-10-CM

## 2018-06-04 DIAGNOSIS — E785 Hyperlipidemia, unspecified: Secondary | ICD-10-CM

## 2018-06-04 LAB — LIPID PANEL
Cholesterol: 287 mg/dL — ABNORMAL HIGH (ref 0–200)
HDL: 66 mg/dL (ref >39.00)
LDL Cholesterol: 206 mg/dL — ABNORMAL HIGH (ref 0–99)
NONHDL: 220.84
Total CHOL/HDL Ratio: 4
Triglycerides: 74 mg/dL (ref 0.0–149.0)
VLDL: 14.8 mg/dL (ref 0.0–40.0)

## 2018-06-04 LAB — GLUCOSE, RANDOM: Glucose, Bld: 83 mg/dL (ref 70–99)

## 2018-06-04 LAB — HEMOGLOBIN A1C: HEMOGLOBIN A1C: 5.6 % (ref 4.6–6.5)

## 2018-06-04 NOTE — Patient Instructions (Addendum)
Continue lifestyle intervention healthy eating and exercise .  Consider compression stocking s to help with swelling  Most likely from varicose veins and  legs down.   No evidence of  Heart problem .   Will notify you  of labs when available.    Health Maintenance, Female Adopting a healthy lifestyle and getting preventive care can go a long way to promote health and wellness. Talk with your health care provider about what schedule of regular examinations is right for you. This is a good chance for you to check in with your provider about disease prevention and staying healthy. In between checkups, there are plenty of things you can do on your own. Experts have done a lot of research about which lifestyle changes and preventive measures are most likely to keep you healthy. Ask your health care provider for more information. Weight and diet Eat a healthy diet  Be sure to include plenty of vegetables, fruits, low-fat dairy products, and lean protein.  Do not eat a lot of foods high in solid fats, added sugars, or salt.  Get regular exercise. This is one of the most important things you can do for your health. ? Most adults should exercise for at least 150 minutes each week. The exercise should increase your heart rate and make you sweat (moderate-intensity exercise). ? Most adults should also do strengthening exercises at least twice a week. This is in addition to the moderate-intensity exercise.  Maintain a healthy weight  Body mass index (BMI) is a measurement that can be used to identify possible weight problems. It estimates body fat based on height and weight. Your health care provider can help determine your BMI and help you achieve or maintain a healthy weight.  For females 70 years of age and older: ? A BMI below 18.5 is considered underweight. ? A BMI of 18.5 to 24.9 is normal. ? A BMI of 25 to 29.9 is considered overweight. ? A BMI of 30 and above is considered obese.  Watch  levels of cholesterol and blood lipids  You should start having your blood tested for lipids and cholesterol at 70 years of age, then have this test every 5 years.  You may need to have your cholesterol levels checked more often if: ? Your lipid or cholesterol levels are high. ? You are older than 70 years of age. ? You are at high risk for heart disease.  Cancer screening Lung Cancer  Lung cancer screening is recommended for adults 8-81 years old who are at high risk for lung cancer because of a history of smoking.  A yearly low-dose CT scan of the lungs is recommended for people who: ? Currently smoke. ? Have quit within the past 15 years. ? Have at least a 30-pack-year history of smoking. A pack year is smoking an average of one pack of cigarettes a day for 1 year.  Yearly screening should continue until it has been 15 years since you quit.  Yearly screening should stop if you develop a health problem that would prevent you from having lung cancer treatment.  Breast Cancer  Practice breast self-awareness. This means understanding how your breasts normally appear and feel.  It also means doing regular breast self-exams. Let your health care provider know about any changes, no matter how small.  If you are in your 20s or 30s, you should have a clinical breast exam (CBE) by a health care provider every 1-3 years as part of a regular  health exam.  If you are 40 or older, have a CBE every year. Also consider having a breast X-ray (mammogram) every year.  If you have a family history of breast cancer, talk to your health care provider about genetic screening.  If you are at high risk for breast cancer, talk to your health care provider about having an MRI and a mammogram every year.  Breast cancer gene (BRCA) assessment is recommended for women who have family members with BRCA-related cancers. BRCA-related cancers include: ? Breast. ? Ovarian. ? Tubal. ? Peritoneal  cancers.  Results of the assessment will determine the need for genetic counseling and BRCA1 and BRCA2 testing.  Cervical Cancer Your health care provider may recommend that you be screened regularly for cancer of the pelvic organs (ovaries, uterus, and vagina). This screening involves a pelvic examination, including checking for microscopic changes to the surface of your cervix (Pap test). You may be encouraged to have this screening done every 3 years, beginning at age 41.  For women ages 56-65, health care providers may recommend pelvic exams and Pap testing every 3 years, or they may recommend the Pap and pelvic exam, combined with testing for human papilloma virus (HPV), every 5 years. Some types of HPV increase your risk of cervical cancer. Testing for HPV may also be done on women of any age with unclear Pap test results.  Other health care providers may not recommend any screening for nonpregnant women who are considered low risk for pelvic cancer and who do not have symptoms. Ask your health care provider if a screening pelvic exam is right for you.  If you have had past treatment for cervical cancer or a condition that could lead to cancer, you need Pap tests and screening for cancer for at least 20 years after your treatment. If Pap tests have been discontinued, your risk factors (such as having a new sexual partner) need to be reassessed to determine if screening should resume. Some women have medical problems that increase the chance of getting cervical cancer. In these cases, your health care provider may recommend more frequent screening and Pap tests.  Colorectal Cancer  This type of cancer can be detected and often prevented.  Routine colorectal cancer screening usually begins at 70 years of age and continues through 70 years of age.  Your health care provider may recommend screening at an earlier age if you have risk factors for colon cancer.  Your health care provider may also  recommend using home test kits to check for hidden blood in the stool.  A small camera at the end of a tube can be used to examine your colon directly (sigmoidoscopy or colonoscopy). This is done to check for the earliest forms of colorectal cancer.  Routine screening usually begins at age 71.  Direct examination of the colon should be repeated every 5-10 years through 70 years of age. However, you may need to be screened more often if early forms of precancerous polyps or small growths are found.  Skin Cancer  Check your skin from head to toe regularly.  Tell your health care provider about any new moles or changes in moles, especially if there is a change in a mole's shape or color.  Also tell your health care provider if you have a mole that is larger than the size of a pencil eraser.  Always use sunscreen. Apply sunscreen liberally and repeatedly throughout the day.  Protect yourself by wearing long sleeves, pants, a  wide-brimmed hat, and sunglasses whenever you are outside.  Heart disease, diabetes, and high blood pressure  High blood pressure causes heart disease and increases the risk of stroke. High blood pressure is more likely to develop in: ? People who have blood pressure in the high end of the normal range (130-139/85-89 mm Hg). ? People who are overweight or obese. ? People who are African American.  If you are 8-4 years of age, have your blood pressure checked every 3-5 years. If you are 52 years of age or older, have your blood pressure checked every year. You should have your blood pressure measured twice-once when you are at a hospital or clinic, and once when you are not at a hospital or clinic. Record the average of the two measurements. To check your blood pressure when you are not at a hospital or clinic, you can use: ? An automated blood pressure machine at a pharmacy. ? A home blood pressure monitor.  If you are between 41 years and 65 years old, ask your  health care provider if you should take aspirin to prevent strokes.  Have regular diabetes screenings. This involves taking a blood sample to check your fasting blood sugar level. ? If you are at a normal weight and have a low risk for diabetes, have this test once every three years after 70 years of age. ? If you are overweight and have a high risk for diabetes, consider being tested at a younger age or more often. Preventing infection Hepatitis B  If you have a higher risk for hepatitis B, you should be screened for this virus. You are considered at high risk for hepatitis B if: ? You were born in a country where hepatitis B is common. Ask your health care provider which countries are considered high risk. ? Your parents were born in a high-risk country, and you have not been immunized against hepatitis B (hepatitis B vaccine). ? You have HIV or AIDS. ? You use needles to inject street drugs. ? You live with someone who has hepatitis B. ? You have had sex with someone who has hepatitis B. ? You get hemodialysis treatment. ? You take certain medicines for conditions, including cancer, organ transplantation, and autoimmune conditions.  Hepatitis C  Blood testing is recommended for: ? Everyone born from [birth year] through 1963. ? Anyone with known risk factors for hepatitis C.  Sexually transmitted infections (STIs)  You should be screened for sexually transmitted infections (STIs) including gonorrhea and chlamydia if: ? You are sexually active and are younger than 70 years of age. ? You are older than 70 years of age and your health care provider tells you that you are at risk for this type of infection. ? Your sexual activity has changed since you were last screened and you are at an increased risk for chlamydia or gonorrhea. Ask your health care provider if you are at risk.  If you do not have HIV, but are at risk, it may be recommended that you take a prescription medicine daily to  prevent HIV infection. This is called pre-exposure prophylaxis (PrEP). You are considered at risk if: ? You are sexually active and do not regularly use condoms or know the HIV status of your partner(s). ? You take drugs by injection. ? You are sexually active with a partner who has HIV.  Talk with your health care provider about whether you are at high risk of being infected with HIV. If you choose to  begin PrEP, you should first be tested for HIV. You should then be tested every 3 months for as long as you are taking PrEP. Pregnancy  If you are premenopausal and you may become pregnant, ask your health care provider about preconception counseling.  If you may become pregnant, take 400 to 800 micrograms (mcg) of folic acid every day.  If you want to prevent pregnancy, talk to your health care provider about birth control (contraception). Osteoporosis and menopause  Osteoporosis is a disease in which the bones lose minerals and strength with aging. This can result in serious bone fractures. Your risk for osteoporosis can be identified using a bone density scan.  If you are 76 years of age or older, or if you are at risk for osteoporosis and fractures, ask your health care provider if you should be screened.  Ask your health care provider whether you should take a calcium or vitamin D supplement to lower your risk for osteoporosis.  Menopause may have certain physical symptoms and risks.  Hormone replacement therapy may reduce some of these symptoms and risks. Talk to your health care provider about whether hormone replacement therapy is right for you. Follow these instructions at home:  Schedule regular health, dental, and eye exams.  Stay current with your immunizations.  Do not use any tobacco products including cigarettes, chewing tobacco, or electronic cigarettes.  If you are pregnant, do not drink alcohol.  If you are breastfeeding, limit how much and how often you drink  alcohol.  Limit alcohol intake to no more than 1 drink per day for nonpregnant women. One drink equals 12 ounces of beer, 5 ounces of wine, or 1 ounces of hard liquor.  Do not use street drugs.  Do not share needles.  Ask your health care provider for help if you need support or information about quitting drugs.  Tell your health care provider if you often feel depressed.  Tell your health care provider if you have ever been abused or do not feel safe at home. This information is not intended to replace advice given to you by your health care provider. Make sure you discuss any questions you have with your health care provider. Document Released: 03/17/2011 Document Revised: 02/07/2016 Document Reviewed: 06/05/2015 Elsevier Interactive Patient Education  [employer].

## 2018-06-04 NOTE — Progress Notes (Signed)
Chief Complaint  Patient presents with  . Annual Exam    no new concerns.  . Medication Management  . Hyperlipidemia  . Hypertension    HPI: Shelley Wright 72 y.o. comes in today for Preventive Medicare exam/ wellness visit .Since last visit.  Derm:  Generic metrogel.  Works but expensive  abd pain went away see dr Delane Ginger for colitis     Bp is good  Lipid iup  20 - 40 mg  Gest swelling in legs feet at end of dayhx vv  Not using stocking s Mood stable neg dep screen  Health Maintenance  Topic Date Due  . DEXA SCAN  10/02/2012  . TETANUS/TDAP  09/15/2016  . INFLUENZA VACCINE  04/15/2018  . COLONOSCOPY  09/26/2019  . MAMMOGRAM  01/20/2020  . Hepatitis C Screening  Completed  . PNA vac Low Risk Adult  Completed   Health Maintenance Review LIFESTYLE:  Exercise:  Nope  preeschool exercise  Tobacco/ETS: no Alcohol:  no Sugar beverages: Sleep:  A lot  Drug use: no HH:  2  No Work  8-3  5 days per week    Hearing:  Ears  itch .   Vision:  No limitations at present . Last eye check UTD contacts   Safety:  Has smoke detector and wears seat belts.   No excess sun exposure. Sees dentist regularly.  Memory: Felt to be good  , no concern from her or her family.  Depression: No anhedonia unusual crying or depressive symptoms  Nutrition: Eats well balanced diet; adequate calcium and vitamin D. No swallowing chewing problems.  Injury: no major injuries in the last six months.  Other healthcare providers:  Reviewed today .  Social:  Lives with spouse married. No pets.   Preventive parameters: up-to-date  Reviewed   ADLS:   There are no problems or need for assistance  driving, feeding, obtaining food, dressing, toileting and bathing, managing money using phone. She is independent.   ROS:  GEN/ HEENT: No fever, significant weight changes sweats headaches vision problems hearing changes, CV/ PULM; No chest pain shortness of breath cough, syncope,edema  change in exercise  tolerance. GI /GU: No adominal pain, vomiting, change in bowel habits. No blood in the stool. No significant GU symptoms. SKIN/HEME: ,no acute skin rashes suspicious lesions or bleeding. No lymphadenopathy, nodules, masses.  NEURO/ PSYCH:  No neurologic signs such as weakness numbness. No depression anxiety. IMM/ Allergy: No unusual infections.  Allergy .   REST of 12 system review negative except as per HPI   Past Medical History:  Diagnosis Date  . Anxiety   . Arthritis   . GERD (gastroesophageal reflux disease)   . History of smoking   . History of ulcerative colitis 2005   pancolitis   . Hyperlipidemia   . Hypertension   . Normal nuclear stress test 2008   myoview     Family History  Problem Relation Age of Onset  . Pancreatic cancer Father        deceased  . Hypertension Father   . Breast cancer Mother 74  . Angina Mother   . Hypertension Mother   . Colon cancer Neg Hx   . Colon polyps Neg Hx   . Rectal cancer Neg Hx   . Stomach cancer Neg Hx     Social History   Socioeconomic History  . Marital status: Married    Spouse name: Not on file  . Number of children: 4  .  Years of education: Not on file  . Highest education level: Not on file  Occupational History  . Not on file  Social Needs  . Financial resource strain: Not on file  . Food insecurity:    Worry: Not on file    Inability: Not on file  . Transportation needs:    Medical: Not on file    Non-medical: Not on file  Tobacco Use  . Smoking status: Former Smoker    Last attempt to quit: 07/04/2014    Years since quitting: 3.9  . Smokeless tobacco: Never Used  Substance and Sexual Activity  . Alcohol use: No    Alcohol/week: 0.0 standard drinks  . Drug use: No  . Sexual activity: Not on file  Lifestyle  . Physical activity:    Days per week: Not on file    Minutes per session: Not on file  . Stress: Not on file  Relationships  . Social connections:    Talks on phone: Not on file    Gets  together: Not on file    Attends religious service: Not on file    Active member of club or organization: Not on file    Attends meetings of clubs or organizations: Not on file    Relationship status: Not on file  Other Topics Concern  . Not on file  Social History Narrative   Hh  Of 2    Pet dog    Off tobacco.   [employer].     Teacher runs school.   8 - 3    G4 P4    Husband had cva and blocked carotids     Outpatient Encounter Medications as of 06/04/2018  Medication Sig  . atorvastatin (LIPITOR) 40 MG tablet Take 1 tablet (40 mg total) by mouth daily.  Marland Kitchen escitalopram (LEXAPRO) 10 MG tablet TAKE 1/2 TO 1 TABLET BY  MOUTH EVERY DAY  . lisinopril-hydrochlorothiazide (PRINZIDE,ZESTORETIC) 20-12.5 MG tablet TAKE 1 TABLET BY MOUTH  DAILY  . metroNIDAZOLE (METROGEL) 0.75 % gel Apply 1 application topically 2 (two) times daily.  Marland Kitchen sulfaSALAzine (AZULFIDINE) 500 MG EC tablet Take 2 tablets (1,000 mg total) by mouth 2 (two) times daily.   Facility-Administered Encounter Medications as of 06/04/2018  Medication  . 0.9 %  sodium chloride infusion    EXAM:  BP 100/66 (BP Location: Right Arm, Patient Position: Sitting, Cuff Size: Normal)   Pulse 82   Temp 97.7 F (36.5 C) (Oral)   Ht 5' 1.5" (1.562 m)   Wt 152 lb 9.6 oz (69.2 kg)   BMI 28.37 kg/m   Body mass index is 28.37 kg/m.  Physical Exam: Vital signs reviewed PYP:PJKD is a well-developed well-nourished alert cooperative   who appears stated age in no acute distress.  HEENT: normocephalic atraumatic , Eyes: PERRL EOM's full, conjunctiva clear, Nares: paten,t no deformity discharge or tenderness., Ears: no deformity EAC's clear TMs with normal landmarks. Ed scaly  Mouth: clear OP, no lesions, edema.  Moist mucous membranes. Dentition in adequate repair. NECK: supple without masses, thyromegaly or bruits. CHEST/PULM:  Clear to auscultation and percussion breath sounds equal no wheeze , rales or rhonchi. No chest wall  deformities or tenderness.Breast: normal by inspection . No dimpling, discharge, masses, tenderness or discharge . CV: PMI is nondisplaced, S1 S2 no gallops, murmurs, rubs. Peripheral pulses are full without delay.No JVD .  Vv noted puffiness legs  ABDOMEN: Bowel sounds normal nontender  No guard or rebound, no hepato splenomegal  no CVA tenderness.   Extremtities:  No clubbing cyanosis or edema, no acute joint swelling or redness no focal atrophy NEURO:  Oriented x3, cranial nerves 3-12 appear to be intact, no obvious focal weakness,gait within normal limits no abnormal reflexes or asymmetrical SKIN: No acute rashes normal turgor, color, no bruising or petechiae. Tanned  Faded rosacea face  PSYCH: Oriented, good eye contact, no obvious depression anxiety, cognition and judgment appear normal. LN: no cervical axillary inguinal adenopathy No noted deficits in memory, attention, and speech.   Lab Results  Component Value Date   WBC 7.6 03/16/2018   HGB 13.3 03/16/2018   HCT 40.0 03/16/2018   PLT 240.0 03/16/2018   GLUCOSE 83 06/04/2018   CHOL 287 (H) 06/04/2018   TRIG 74.0 06/04/2018   HDL 66.00 06/04/2018   LDLDIRECT 138.5 10/17/2013   LDLCALC 206 (H) 06/04/2018   ALT 18 03/16/2018   AST 24 03/16/2018   NA 138 03/16/2018   K 4.3 03/16/2018   CL 100 03/16/2018   CREATININE 0.72 03/16/2018   BUN 20 03/16/2018   CO2 32 03/16/2018   TSH 2.70 03/16/2018   HGBA1C 5.6 06/04/2018    ASSESSMENT AND PLAN:  Discussed the following assessment and plan:  Visit for preventive health examination  Need for influenza vaccination - Plan: Flu Vaccine QUAD 6+ mos PF IM (Fluarix Quad PF)  Hyperlipidemia, unspecified hyperlipidemia type - now onatorva 40 fasting today!   recheck although a bit early but do for pat conveneince  - Plan: Glucose, Random, Hemoglobin A1c, Lipid panel, Glucose, Random, Hemoglobin A1c, Lipid panel, CANCELED: Lipid panel, CANCELED: Hemoglobin A1c, CANCELED: Glucose,  Random  Medication management - Plan: Glucose, Random, Hemoglobin A1c, Lipid panel, Glucose, Random, Hemoglobin A1c, Lipid panel, CANCELED: Lipid panel, CANCELED: Hemoglobin A1c, CANCELED: Glucose, Random  Essential hypertension - Plan: Glucose, Random, Hemoglobin A1c, Lipid panel, Glucose, Random, Hemoglobin A1c, Lipid panel, CANCELED: Lipid panel, CANCELED: Hemoglobin A1c, CANCELED: Glucose, Random  Hyperglycemia - Plan: Glucose, Random, Hemoglobin A1c, Lipid panel, Glucose, Random, Hemoglobin A1c, Lipid panel, CANCELED: Lipid panel, CANCELED: Hemoglobin A1c, CANCELED: Glucose, Random  Varicose veins of both legs with edema  Patient Care Team: Panosh, Standley Brooking, MD as PCP - General Mauri Pole, MD as Consulting Physician (Gastroenterology) Rolm Bookbinder, MD as Consulting Physician (Dermatology)  Patient Instructions  Continue lifestyle intervention healthy eating and exercise .  Consider compression stocking s to help with swelling  Most likely from varicose veins and  legs down.   No evidence of  Heart problem .   Will notify you  of labs when available.    Health Maintenance, Female Adopting a healthy lifestyle and getting preventive care can go a long way to promote health and wellness. Talk with your health care provider about what schedule of regular examinations is right for you. This is a good chance for you to check in with your provider about disease prevention and staying healthy. In between checkups, there are plenty of things you can do on your own. Experts have done a lot of research about which lifestyle changes and preventive measures are most likely to keep you healthy. Ask your health care provider for more information. Weight and diet Eat a healthy diet  Be sure to include plenty of vegetables, fruits, low-fat dairy products, and lean protein.  Do not eat a lot of foods high in solid fats, added sugars, or salt.  Get regular exercise. This is one of the most  important things you can  do for your health. ? Most adults should exercise for at least 150 minutes each week. The exercise should increase your heart rate and make you sweat (moderate-intensity exercise). ? Most adults should also do strengthening exercises at least twice a week. This is in addition to the moderate-intensity exercise.  Maintain a healthy weight  Body mass index (BMI) is a measurement that can be used to identify possible weight problems. It estimates body fat based on height and weight. Your health care provider can help determine your BMI and help you achieve or maintain a healthy weight.  For females 87 years of age and older: ? A BMI below 18.5 is considered underweight. ? A BMI of 18.5 to 24.9 is normal. ? A BMI of 25 to 29.9 is considered overweight. ? A BMI of 30 and above is considered obese.  Watch levels of cholesterol and blood lipids  You should start having your blood tested for lipids and cholesterol at 71 years of age, then have this test every 5 years.  You may need to have your cholesterol levels checked more often if: ? Your lipid or cholesterol levels are high. ? You are older than 70 years of age. ? You are at high risk for heart disease.  Cancer screening Lung Cancer  Lung cancer screening is recommended for adults 35-7 years old who are at high risk for lung cancer because of a history of smoking.  A yearly low-dose CT scan of the lungs is recommended for people who: ? Currently smoke. ? Have quit within the past 15 years. ? Have at least a 30-pack-year history of smoking. A pack year is smoking an average of one pack of cigarettes a day for 1 year.  Yearly screening should continue until it has been 15 years since you quit.  Yearly screening should stop if you develop a health problem that would prevent you from having lung cancer treatment.  Breast Cancer  Practice breast self-awareness. This means understanding how your breasts  normally appear and feel.  It also means doing regular breast self-exams. Let your health care provider know about any changes, no matter how small.  If you are in your 20s or 30s, you should have a clinical breast exam (CBE) by a health care provider every 1-3 years as part of a regular health exam.  If you are 46 or older, have a CBE every year. Also consider having a breast X-ray (mammogram) every year.  If you have a family history of breast cancer, talk to your health care provider about genetic screening.  If you are at high risk for breast cancer, talk to your health care provider about having an MRI and a mammogram every year.  Breast cancer gene (BRCA) assessment is recommended for women who have family members with BRCA-related cancers. BRCA-related cancers include: ? Breast. ? Ovarian. ? Tubal. ? Peritoneal cancers.  Results of the assessment will determine the need for genetic counseling and BRCA1 and BRCA2 testing.  Cervical Cancer Your health care provider may recommend that you be screened regularly for cancer of the pelvic organs (ovaries, uterus, and vagina). This screening involves a pelvic examination, including checking for microscopic changes to the surface of your cervix (Pap test). You may be encouraged to have this screening done every 3 years, beginning at age 22.  For women ages 54-65, health care providers may recommend pelvic exams and Pap testing every 3 years, or they may recommend the Pap and pelvic exam, combined  with testing for human papilloma virus (HPV), every 5 years. Some types of HPV increase your risk of cervical cancer. Testing for HPV may also be done on women of any age with unclear Pap test results.  Other health care providers may not recommend any screening for nonpregnant women who are considered low risk for pelvic cancer and who do not have symptoms. Ask your health care provider if a screening pelvic exam is right for you.  If you have had  past treatment for cervical cancer or a condition that could lead to cancer, you need Pap tests and screening for cancer for at least 20 years after your treatment. If Pap tests have been discontinued, your risk factors (such as having a new sexual partner) need to be reassessed to determine if screening should resume. Some women have medical problems that increase the chance of getting cervical cancer. In these cases, your health care provider may recommend more frequent screening and Pap tests.  Colorectal Cancer  This type of cancer can be detected and often prevented.  Routine colorectal cancer screening usually begins at 70 years of age and continues through 70 years of age.  Your health care provider may recommend screening at an earlier age if you have risk factors for colon cancer.  Your health care provider may also recommend using home test kits to check for hidden blood in the stool.  A small camera at the end of a tube can be used to examine your colon directly (sigmoidoscopy or colonoscopy). This is done to check for the earliest forms of colorectal cancer.  Routine screening usually begins at age 43.  Direct examination of the colon should be repeated every 5-10 years through 70 years of age. However, you may need to be screened more often if early forms of precancerous polyps or small growths are found.  Skin Cancer  Check your skin from head to toe regularly.  Tell your health care provider about any new moles or changes in moles, especially if there is a change in a mole's shape or color.  Also tell your health care provider if you have a mole that is larger than the size of a pencil eraser.  Always use sunscreen. Apply sunscreen liberally and repeatedly throughout the day.  Protect yourself by wearing long sleeves, pants, a wide-brimmed hat, and sunglasses whenever you are outside.  Heart disease, diabetes, and high blood pressure  High blood pressure causes heart  disease and increases the risk of stroke. High blood pressure is more likely to develop in: ? People who have blood pressure in the high end of the normal range (130-139/85-89 mm Hg). ? People who are overweight or obese. ? People who are African American.  If you are 38-1 years of age, have your blood pressure checked every 3-5 years. If you are 60 years of age or older, have your blood pressure checked every year. You should have your blood pressure measured twice-once when you are at a hospital or clinic, and once when you are not at a hospital or clinic. Record the average of the two measurements. To check your blood pressure when you are not at a hospital or clinic, you can use: ? An automated blood pressure machine at a pharmacy. ? A home blood pressure monitor.  If you are between 56 years and 74 years old, ask your health care provider if you should take aspirin to prevent strokes.  Have regular diabetes screenings. This involves taking a blood  sample to check your fasting blood sugar level. ? If you are at a normal weight and have a low risk for diabetes, have this test once every three years after 70 years of age. ? If you are overweight and have a high risk for diabetes, consider being tested at a younger age or more often. Preventing infection Hepatitis B  If you have a higher risk for hepatitis B, you should be screened for this virus. You are considered at high risk for hepatitis B if: ? You were born in a country where hepatitis B is common. Ask your health care provider which countries are considered high risk. ? Your parents were born in a high-risk country, and you have not been immunized against hepatitis B (hepatitis B vaccine). ? You have HIV or AIDS. ? You use needles to inject street drugs. ? You live with someone who has hepatitis B. ? You have had sex with someone who has hepatitis B. ? You get hemodialysis treatment. ? You take certain medicines for conditions,  including cancer, organ transplantation, and autoimmune conditions.  Hepatitis C  Blood testing is recommended for: ? Everyone born from [birth year] through 1963. ? Anyone with known risk factors for hepatitis C.  Sexually transmitted infections (STIs)  You should be screened for sexually transmitted infections (STIs) including gonorrhea and chlamydia if: ? You are sexually active and are younger than 70 years of age. ? You are older than 70 years of age and your health care provider tells you that you are at risk for this type of infection. ? Your sexual activity has changed since you were last screened and you are at an increased risk for chlamydia or gonorrhea. Ask your health care provider if you are at risk.  If you do not have HIV, but are at risk, it may be recommended that you take a prescription medicine daily to prevent HIV infection. This is called pre-exposure prophylaxis (PrEP). You are considered at risk if: ? You are sexually active and do not regularly use condoms or know the HIV status of your partner(s). ? You take drugs by injection. ? You are sexually active with a partner who has HIV.  Talk with your health care provider about whether you are at high risk of being infected with HIV. If you choose to begin PrEP, you should first be tested for HIV. You should then be tested every 3 months for as long as you are taking PrEP. Pregnancy  If you are premenopausal and you may become pregnant, ask your health care provider about preconception counseling.  If you may become pregnant, take 400 to 800 micrograms (mcg) of folic acid every day.  If you want to prevent pregnancy, talk to your health care provider about birth control (contraception). Osteoporosis and menopause  Osteoporosis is a disease in which the bones lose minerals and strength with aging. This can result in serious bone fractures. Your risk for osteoporosis can be identified using a bone density scan.  If you are  53 years of age or older, or if you are at risk for osteoporosis and fractures, ask your health care provider if you should be screened.  Ask your health care provider whether you should take a calcium or vitamin D supplement to lower your risk for osteoporosis.  Menopause may have certain physical symptoms and risks.  Hormone replacement therapy may reduce some of these symptoms and risks. Talk to your health care provider about whether hormone replacement therapy is  right for you. Follow these instructions at home:  Schedule regular health, dental, and eye exams.  Stay current with your immunizations.  Do not use any tobacco products including cigarettes, chewing tobacco, or electronic cigarettes.  If you are pregnant, do not drink alcohol.  If you are breastfeeding, limit how much and how often you drink alcohol.  Limit alcohol intake to no more than 1 drink per day for nonpregnant women. One drink equals 12 ounces of beer, 5 ounces of wine, or 1 ounces of hard liquor.  Do not use street drugs.  Do not share needles.  Ask your health care provider for help if you need support or information about quitting drugs.  Tell your health care provider if you often feel depressed.  Tell your health care provider if you have ever been abused or do not feel safe at home. This information is not intended to replace advice given to you by your health care provider. Make sure you discuss any questions you have with your health care provider. Document Released: 03/17/2011 Document Revised: 02/07/2016 Document Reviewed: 06/05/2015 Elsevier Interactive Patient Education  2018 [place]. Panosh M.D.

## 2018-06-08 ENCOUNTER — Other Ambulatory Visit: Payer: Self-pay | Admitting: Internal Medicine

## 2018-06-08 DIAGNOSIS — E785 Hyperlipidemia, unspecified: Secondary | ICD-10-CM

## 2018-06-08 MED ORDER — ROSUVASTATIN CALCIUM 10 MG PO TABS: 10.0000 mg | ORAL_TABLET | Freq: Every day | ORAL | 1 refills | Status: DC

## 2018-07-05 ENCOUNTER — Other Ambulatory Visit: Payer: Self-pay | Admitting: Internal Medicine

## 2018-07-12 ENCOUNTER — Telehealth: Payer: Self-pay | Admitting: Internal Medicine

## 2018-07-12 NOTE — Telephone Encounter (Signed)
Copied from [place] [id number]. Topic: Quick Communication - See Telephone Encounter >> Jul 12, 2018 11:25 AM Gardiner Ramus wrote: CRM for notification. See Telephone encounter for: 07/12/18.metroNIDAZOLE (METROGEL) 0.75 % gel [[id number]]  pt called and stated that she needs a Prior Authorization. Patient stated that this medication is over 100 for her. Pt stated that if dr Regis Bill stated that she needed this medication she could get it cheaper. [place], [place] [employer] [phone] (Phone) [phone] (Fax)

## 2018-07-13 NOTE — Telephone Encounter (Signed)
Prior auth for Metronidazole 0.75gel sent to [email or web address].

## 2018-07-14 NOTE — Telephone Encounter (Signed)
Was given paperwork on medication denial Letter states that Medicare denied coverage of the Metronidazole Gel Below the message states that the insurance did NOT require a PA.   Called [facility] and spoke with Erline Levine, states that insurance paid for Medication and left the patient with $275.00 copay for 90-day supply. Medication is higher tier. Per Erline Levine the patient was notified of the higher copay and was okay with paying the $275.00 so that medication shipped out on 07/09/18.   Please advise Dr Regis Bill if there is a similar medication that might be cheaper for the patient. No alternatives listed or available since insurance "covered" the current medication.

## 2018-07-14 NOTE — Telephone Encounter (Signed)
Fax received from [place] stating this medication is on the pts plan's list of covered drugs and a prior auth is not required at this time and this was sent to be scanned.  I left a message for the pt to call the office and CRM also created.

## 2018-07-14 NOTE — Telephone Encounter (Signed)
Wow   That's a lot of money . I dont know what would be less expensive  . She would have to check with pharmacy and insurance about less expensive alternatives

## 2018-07-14 NOTE — Telephone Encounter (Signed)
Additional fax received stating the medication was denied under Medicare Part D and given to Dr Velora Mediate asst.

## 2018-07-19 NOTE — Telephone Encounter (Signed)
Noted  

## 2018-07-21 NOTE — Telephone Encounter (Signed)
Patient was returning call and states Medicare advised patient if advised PCP to state medication is required for her dx, patient would like to discuss, please advise

## 2018-07-22 NOTE — Telephone Encounter (Signed)
Virl Cagey, CMA 07/21/2018 02:33 PM  See 07/12/18 TE, LM for patient to call back There was an issue with her medication being high copay and we were trying to reach her to see if she is okay with paying this or if she is wanting to try and find something cheaper.

## 2018-07-27 NOTE — Telephone Encounter (Signed)
LM for call back

## 2018-08-08 ENCOUNTER — Other Ambulatory Visit: Payer: Self-pay | Admitting: Internal Medicine

## 2018-08-26 ENCOUNTER — Other Ambulatory Visit: Payer: Self-pay | Admitting: Internal Medicine

## 2018-09-11 ENCOUNTER — Other Ambulatory Visit: Payer: Self-pay | Admitting: Internal Medicine

## 2018-11-05 ENCOUNTER — Ambulatory Visit: Payer: Medicare Other | Admitting: Gastroenterology

## 2018-11-05 ENCOUNTER — Encounter: Payer: Self-pay | Admitting: Gastroenterology

## 2018-11-05 ENCOUNTER — Ambulatory Visit (INDEPENDENT_AMBULATORY_CARE_PROVIDER_SITE_OTHER)
Admission: RE | Admit: 2018-11-05 | Discharge: 2018-11-05 | Disposition: A | Discharge status: Home / Self Care | Payer: Medicare Other | Source: Ambulatory Visit | Attending: Gastroenterology | Admitting: Gastroenterology

## 2018-11-05 VITALS — BP 104/64 | HR 96 | Ht 61.81 in | Wt 156.4 lb

## 2018-11-05 DIAGNOSIS — R1084 Generalized abdominal pain: Secondary | ICD-10-CM

## 2018-11-05 DIAGNOSIS — K5180 Other ulcerative colitis without complications: Secondary | ICD-10-CM

## 2018-11-05 DIAGNOSIS — K5909 Other constipation: Secondary | ICD-10-CM

## 2018-11-05 DIAGNOSIS — R1030 Lower abdominal pain, unspecified: Secondary | ICD-10-CM | POA: Diagnosis not present

## 2018-11-05 MED ORDER — LUBIPROSTONE 8 MCG PO CAPS: 8.0000 ug | ORAL_CAPSULE | Freq: Two times a day (BID) | ORAL | 3 refills | Status: DC

## 2018-11-05 NOTE — Patient Instructions (Signed)
Your provider has requested that you have an abdominal x ray before leaving today. Please go to the basement floor to our Radiology department for the test.  We have sent Amitiza to your pharmacy Take Benefiber 1 teaspoon three times a day with meals   Constipation, Adult Constipation is when a person:  Poops (has a bowel movement) fewer times in a week than normal.  Has a hard time pooping.  Has poop that is dry, hard, or bigger than normal. Follow these instructions at home: Eating and drinking   Eat foods that have a lot of fiber, such as: ? Fresh fruits and vegetables. ? Whole grains. ? Beans.  Eat less of foods that are high in fat, low in fiber, or overly processed, such as: ? [place] fries. ? Hamburgers. ? Cookies. ? Candy. ? Soda.  Drink enough fluid to keep your pee (urine) clear or pale yellow. General instructions  Exercise regularly or as told by your doctor.  Go to the restroom when you feel like you need to poop. Do not hold it in.  Take over-the-counter and prescription medicines only as told by your doctor. These include any fiber supplements.  Do pelvic floor retraining exercises, such as: ? Doing deep breathing while relaxing your lower belly (abdomen). ? Relaxing your pelvic floor while pooping.  Watch your condition for any changes.  Keep all follow-up visits as told by your doctor. This is important. Contact a doctor if:  You have pain that gets worse.  You have a fever.  You have not pooped for 4 days.  You throw up (vomit).  You are not hungry.  You lose weight.  You are bleeding from the anus.  You have thin, pencil-like poop (stool). Get help right away if:  You have a fever, and your symptoms suddenly get worse.  You leak poop or have blood in your poop.  Your belly feels hard or bigger than normal (is bloated).  You have very bad belly pain.  You feel dizzy or you faint. This information is not intended to replace  advice given to you by your health care provider. Make sure you discuss any questions you have with your health care provider. Document Released: 02/18/2008 Document Revised: 03/21/2016 Document Reviewed: 02/20/2016 Elsevier Interactive Patient Education  2019 [employer].

## 2018-11-05 NOTE — Progress Notes (Signed)
Shelley Wright    [id number]    10/10/1945  Primary Care Physician:Panosh, Standley Brooking, MD  Referring Physician: Burnis Medin, MD [place], [place] [address]  Chief complaint: Constipation, ulcerative colitis  HPI: 73 year old female with history of ulcerative pan colitis initially diagnosed in 2001. Last colitis flare in 2007  Colonoscopy January 2017 with no evidence of active colitis, one tubular adenoma was removed. Colonoscopy in January 2019 quiescent colitis no active inflammation  She is currently in clinical remission on sulfasalazine. Denies any diarrhea or blood in stool. She is actually constipated with bowel movement once every 2 to 3 days and has lower abdominal discomfort if does not have a bowel movement for more than 2 days.  No rectal bleeding Denies any decreased appetite or weight loss   Outpatient Encounter Medications as of 11/05/2018  Medication Sig  . escitalopram (LEXAPRO) 10 MG tablet TAKE 1/2 TO 1 TABLET BY  MOUTH EVERY DAY  . lisinopril-hydrochlorothiazide (PRINZIDE,ZESTORETIC) 20-12.5 MG tablet TAKE 1 TABLET BY MOUTH  DAILY  . metroNIDAZOLE (METROGEL) 0.75 % gel Apply topically 2 (two) times daily. Apply topically two times daily  . rosuvastatin (CRESTOR) 10 MG tablet Take 1 tablet (10 mg total) by mouth daily.  Marland Kitchen sulfaSALAzine (AZULFIDINE) 500 MG EC tablet Take 2 tablets (1,000 mg total) by mouth 2 (two) times daily.  . [DISCONTINUED] atorvastatin (LIPITOR) 40 MG tablet Take 1 tablet (40 mg total) by mouth daily.   Facility-Administered Encounter Medications as of 11/05/2018  Medication  . 0.9 %  sodium chloride infusion    Allergies as of 11/05/2018  . (No Known Allergies)    Past Medical History:  Diagnosis Date  . Anxiety   . Arthritis   . GERD (gastroesophageal reflux disease)   . History of smoking   . History of ulcerative colitis 2005   pancolitis   . Hyperlipidemia   . Hypertension   . Normal  nuclear stress test 2008   myoview     Past Surgical History:  Procedure Laterality Date  . BREAST BIOPSY Left 1986   biopsy x 2   . COLONOSCOPY    . KNEE SURGERY Left october   x 2    Family History  Problem Relation Age of Onset  . Pancreatic cancer Father        deceased  . Hypertension Father   . Breast cancer Mother 66  . Angina Mother   . Hypertension Mother   . Colon cancer Neg Hx   . Colon polyps Neg Hx   . Rectal cancer Neg Hx   . Stomach cancer Neg Hx     Social History   Socioeconomic History  . Marital status: Married    Spouse name: Not on file  . Number of children: 4  . Years of education: Not on file  . Highest education level: Not on file  Occupational History  . Not on file  Social Needs  . Financial resource strain: Not on file  . Food insecurity:    Worry: Not on file    Inability: Not on file  . Transportation needs:    Medical: Not on file    Non-medical: Not on file  Tobacco Use  . Smoking status: Former Smoker    Last attempt to quit: 07/04/2014    Years since quitting: 4.3  . Smokeless tobacco: Never Used  Substance and Sexual Activity  . Alcohol use: No  Alcohol/week: 0.0 standard drinks  . Drug use: No  . Sexual activity: Not on file  Lifestyle  . Physical activity:    Days per week: Not on file    Minutes per session: Not on file  . Stress: Not on file  Relationships  . Social connections:    Talks on phone: Not on file    Gets together: Not on file    Attends religious service: Not on file    Active member of club or organization: Not on file    Attends meetings of clubs or organizations: Not on file    Relationship status: Not on file  . Intimate partner violence:    Fear of current or ex partner: Not on file    Emotionally abused: Not on file    Physically abused: Not on file    Forced sexual activity: Not on file  Other Topics Concern  . Not on file  Social History Narrative   Hh  Of 2    Pet dog    Off  tobacco.   [employer].     Teacher runs school.   40 - 3    G4 P4    Husband had cva and blocked carotids       Review of systems: Review of Systems  Constitutional: Negative for fever and chills.  HENT: Negative.   Eyes: Negative for blurred vision.  Respiratory: Negative for cough, shortness of breath and wheezing.   Cardiovascular: Negative for chest pain and palpitations.  Gastrointestinal: as per HPI Genitourinary: Negative for dysuria, urgency, frequency and hematuria.  Musculoskeletal: Positive for myalgias, back pain and joint pain.  Skin: Negative for itching and rash.  Neurological: Negative for dizziness, tremors, focal weakness, seizures and loss of consciousness.  Endo/Heme/Allergies: Negative for seasonal allergies.  Psychiatric/Behavioral: Negative for depression, suicidal ideas and hallucinations.  All other systems reviewed and are negative.   Physical Exam: Vitals:   11/05/18 1426  BP: 104/64  Pulse: 96   Body mass index is 28.78 kg/m. Gen:      No acute distress HEENT:  EOMI, sclera anicteric Neck:     No masses; no thyromegaly Lungs:    Clear to auscultation bilaterally; normal respiratory effort CV:         Regular rate and rhythm; no murmurs Abd:      + bowel sounds; soft, non-tender; no palpable masses, no distension Ext:    No edema; adequate peripheral perfusion Skin:      Warm and dry; no rash Neuro: alert and oriented x 3 Psych: normal mood and affect  Data Reviewed:  Reviewed labs, radiology imaging, old records and pertinent past GI work up   Assessment and Plan/Recommendations:  73 year old female with history of hypertension, hyperlipidemia, chronic GERD and pancolonic ulcerative colitis diagnosed in 2001, currently in remission on sulfasalazine  Continue sulfasalazine and folic acid Reviewed labs from July 2019.  CBC and CMP stable Upto date with immunization Due for surveillance colonoscopy in Jan  2021  Constipation Increase dietary fiber and fluid intake Benefiber 1 teaspoon 3 times daily with meals Start Amitiza 84mg BID and titrate dose based on response  Lower abdominal discomfort likely secondary to increased stool burden. Will obtain 2 view abd x- ray, if increased stool burden, will consider bowel purge.    25 minutes was spent face-to-face with the patient. Greater than 50% of the time used for counseling as well as treatment plan and follow-up. She had multiple questions which  were answered to her satisfaction  K. Denzil Magnuson , MD [phone]    CC: Panosh, Standley Brooking, MD

## 2018-11-08 ENCOUNTER — Encounter: Payer: Self-pay | Admitting: Gastroenterology

## 2018-11-12 ENCOUNTER — Other Ambulatory Visit: Payer: Self-pay

## 2018-11-12 ENCOUNTER — Telehealth: Payer: Self-pay | Admitting: Gastroenterology

## 2018-11-12 MED ORDER — LINACLOTIDE 72 MCG PO CAPS: 72.0000 ug | ORAL_CAPSULE | Freq: Every day | ORAL | 3 refills | Status: DC

## 2018-11-12 NOTE — Telephone Encounter (Signed)
Please check if Linzess 43mg daily is covered? Thanks

## 2018-11-12 NOTE — Telephone Encounter (Signed)
Patient is advised. Rx to her pharmacy. She will let me know if this is more affordable or not.

## 2018-11-12 NOTE — Telephone Encounter (Signed)
Pt return call for results.

## 2018-11-12 NOTE — Telephone Encounter (Signed)
Amitiza was not affordable for her. She would have to pay $100 per month for this.  She is Shelley Wright. Not sure what will be a better price, But I can send different Rx to the pharmacy if that is okay. Please advise.

## 2018-12-02 ENCOUNTER — Other Ambulatory Visit: Payer: Self-pay | Admitting: Internal Medicine

## 2018-12-17 ENCOUNTER — Ambulatory Visit: Payer: Medicare Other | Admitting: Gastroenterology

## 2019-02-02 ENCOUNTER — Other Ambulatory Visit: Payer: Self-pay | Admitting: Internal Medicine

## 2019-03-04 ENCOUNTER — Other Ambulatory Visit: Payer: Self-pay

## 2019-03-04 NOTE — Patient Outreach (Signed)
[facility]) Care Management  03/04/2019  Shelley Wright 04-09-1946 [id number]   Medication Adherence call to Shelley Wright [facility] Verify spoke with patient she is past due on Rosuvastatin 10 mg and Lisinopril/Hctz 20/25 mg patient explain she is taking 1 tablet daily and has a few more she ask if we can call [facility] and order both medications [facility] will mail out both medication with in 7 days. Shelley Wright is showing past due under [place].  [place] Management Direct Dial [phone]  Fax [phone] [email or web address]

## 2019-04-22 ENCOUNTER — Other Ambulatory Visit: Payer: Self-pay | Admitting: Internal Medicine

## 2019-06-08 ENCOUNTER — Other Ambulatory Visit: Payer: Self-pay | Admitting: Internal Medicine

## 2019-06-08 DIAGNOSIS — Z1231 Encounter for screening mammogram for malignant neoplasm of breast: Secondary | ICD-10-CM | POA: Diagnosis not present

## 2019-06-08 DIAGNOSIS — R29890 Loss of height: Secondary | ICD-10-CM | POA: Diagnosis not present

## 2019-06-08 DIAGNOSIS — Z803 Family history of malignant neoplasm of breast: Secondary | ICD-10-CM | POA: Diagnosis not present

## 2019-06-08 DIAGNOSIS — Z780 Asymptomatic menopausal state: Secondary | ICD-10-CM | POA: Diagnosis not present

## 2019-06-08 LAB — HM DEXA SCAN
HM Dexa Scan: NORMAL
HM Dexa Scan: NORMAL

## 2019-06-08 LAB — HM MAMMOGRAPHY

## 2019-06-22 ENCOUNTER — Other Ambulatory Visit: Payer: Self-pay | Admitting: Internal Medicine

## 2019-06-30 ENCOUNTER — Other Ambulatory Visit: Payer: Self-pay | Admitting: Internal Medicine

## 2019-07-03 NOTE — Progress Notes (Signed)
Chief Complaint  Patient presents with  . Annual Exam    Pt has no concerns ocass nocturnal vomiting  . Hypertension  . Hyperlipidemia    HPI: Shelley Wright 73 y.o. comes in today for Preventive Medicare exam/ wellness visit .Since last visit. doing ok   On metrogel for rosacea concerns about  Cost  Seems to help.  Check area left cheek chin scratching    bp  Doing ok   Had dexa scan  And mammogram   Has  Been having a few time a month  Acid in chest and vomiting at night    Uses tums some   Had endoscopy jan 2019  .   Hx of UC not falring   Stopped tobacco  7 years.   bp  hld   Health Maintenance  Topic Date Due  . DEXA SCAN  10/02/2012  . TETANUS/TDAP  09/15/2016  . COLONOSCOPY  09/26/2019  . MAMMOGRAM  01/20/2020  . INFLUENZA VACCINE  Completed  . Hepatitis C Screening  Completed  . PNA vac Low Risk Adult  Completed   Health Maintenance Review LIFESTYLE:  Exercise:   no Tobacco/ETS:  no Alcohol:  no Sugar beverages:  No water  Sleep: enough  Drug use: no HH: 2   No pets  Pres school  Teacher     Hearing: ok  Vision:  No limitations at present . Last eye check UTD  Safety:  Has smoke detector and wears seat belts. No excess sun exposure. Sees dentist regularly.  Memory: Felt to be good  , no concern from her or her family.  Depression: No anhedonia unusual crying or depressive symptoms  Nutrition: Eats well balanced diet; adequate calcium and vitamin D. No swallowing chewing problems.  Injury: no major injuries in the last six months.  Other healthcare providers:  Reviewed today .   Preventive parameters: up-to-date  Reviewed   ADLS:   There are no problems or need for assistance  driving, feeding, obtaining food, dressing, toileting and bathing, managing money using phone. She is independent.  ROS:  GEN/ HEENT: No fever, significant weight changes sweats headaches vision problems hearing changes, CV/ PULM; No chest pain shortness of breath  cough, syncope,edema  change in exercise tolerance. GI /GU: No adominal pain,  change in bowel habits. No blood in the stool. No significant GU symptoms. SKIN/HEME: ,no acute skin rashes or bleeding. No lymphadenopathy, nodules, masses.  NEURO/ PSYCH:  No neurologic signs such as weakness numbness. No depression anxiety. IMM/ Allergy: No unusual infections.  Allergy .   REST of 12 system review negative except as per HPI   Past Medical History:  Diagnosis Date  . Anxiety   . Arthritis   . GERD (gastroesophageal reflux disease)   . History of smoking   . History of ulcerative colitis 2005   pancolitis   . Hyperlipidemia   . Hypertension   . Normal nuclear stress test 2008   myoview     Family History  Problem Relation Age of Onset  . Pancreatic cancer Father        deceased  . Hypertension Father   . Breast cancer Mother 57  . Angina Mother   . Hypertension Mother   . Colon cancer Neg Hx   . Colon polyps Neg Hx   . Rectal cancer Neg Hx   . Stomach cancer Neg Hx     Social History   Socioeconomic History  . Marital status: Married  Spouse name: Not on file  . Number of children: 4  . Years of education: Not on file  . Highest education level: Not on file  Occupational History  . Not on file  Social Needs  . Financial resource strain: Not on file  . Food insecurity    Worry: Not on file    Inability: Not on file  . Transportation needs    Medical: Not on file    Non-medical: Not on file  Tobacco Use  . Smoking status: Former Smoker    Quit date: 07/04/2014    Years since quitting: 5.0  . Smokeless tobacco: Never Used  Substance and Sexual Activity  . Alcohol use: No    Alcohol/week: 0.0 standard drinks  . Drug use: No  . Sexual activity: Not on file  Lifestyle  . Physical activity    Days per week: Not on file    Minutes per session: Not on file  . Stress: Not on file  Relationships  . Social Herbalist on phone: Not on file    Gets  together: Not on file    Attends religious service: Not on file    Active member of club or organization: Not on file    Attends meetings of clubs or organizations: Not on file    Relationship status: Not on file  Other Topics Concern  . Not on file  Social History Narrative   Hh  Of 2    Pet dog    Off tobacco.   [employer].     Teacher runs school.   30 - 3    G4 P4    Husband had cva and blocked carotids     Outpatient Encounter Medications as of 07/04/2019  Medication Sig  . escitalopram (LEXAPRO) 10 MG tablet TAKE 1/2 TO 1 TABLET BY  MOUTH DAILY  . lisinopril-hydrochlorothiazide (ZESTORETIC) 20-12.5 MG tablet TAKE 1 TABLET BY MOUTH  DAILY  . metroNIDAZOLE (METROGEL) 0.75 % gel APPLY TOPICALLY 2 (TWO)  TIMES DAILY.  . rosuvastatin (CRESTOR) 10 MG tablet TAKE 1 TABLET BY MOUTH  DAILY  . [DISCONTINUED] linaclotide (LINZESS) 72 MCG capsule Take 1 capsule (72 mcg total) by mouth daily before breakfast.  . [DISCONTINUED] sulfaSALAzine (AZULFIDINE) 500 MG EC tablet Take 2 tablets (1,000 mg total) by mouth 2 (two) times daily.   Facility-Administered Encounter Medications as of 07/04/2019  Medication  . 0.9 %  sodium chloride infusion    EXAM:  BP 122/68 (BP Location: Right Arm, Patient Position: Sitting, Cuff Size: Normal)   Pulse 76   Temp 97.8 F (36.6 C) (Temporal)   Ht 5' 2"  (1.575 m)   Wt 158 lb 12.8 oz (72 kg)   SpO2 95%   BMI 29.04 kg/m   Body mass index is 29.04 kg/m.  Physical Exam: Vital signs reviewed [id number]:[id number] is a well-developed well-nourished alert cooperative   who appears stated age in no acute distress.  HEENT: normocephalic atraumatic , Eyes: PERRL EOM's full, conjunctiva clear, Nares: paten,t no deformity discharge or tenderness., Ears: no deformity EAC's clear TMs with normal landmarks. Mouth: clear OP, no lesions, edema.  Moist mucous membranes. Dentition in adequate repair. NECK: supple without masses, thyromegaly or bruits. CHEST/PULM:   Clear to auscultation and percussion breath sounds equal no wheeze , rales or rhonchi. No chest wall deformities or tenderness. CV: PMI is nondisplaced, S1 S2 no gallops, murmurs, rubs. Peripheral pulses are full without delay.No JVD .  ABDOMEN: Bowel  sounds normal nontender  No guard or rebound, no hepato splenomegal no CVA tenderness.   Extremtities:  No clubbing cyanosis or edema, no acute joint swelling or redness no focal atrophy NEURO:  Oriented x3, cranial nerves 3-12 appear to be intact, no obvious focal weakness,gait within normal limits no abnormal reflexes or asymmetrical SKIN: No acute rashes normal turgor, color, no bruising or petechiae. PSYCH: Oriented, good eye contact, no obvious depression anxiety, cognition and judgment appear normal. LN: no cervical axillary inguinal adenopathy No noted deficits in memory, attention, and speech.   Lab Results  Component Value Date   WBC 10.7 (H) 07/04/2019   HGB 14.4 07/04/2019   HCT 43.8 07/04/2019   PLT 268.0 07/04/2019   GLUCOSE 81 07/04/2019   CHOL 256 (H) 07/04/2019   TRIG 93.0 07/04/2019   HDL 65.50 07/04/2019   LDLDIRECT 138.5 10/17/2013   LDLCALC 172 (H) 07/04/2019   ALT 20 07/04/2019   AST 25 07/04/2019   NA 136 07/04/2019   K 4.2 07/04/2019   CL 97 07/04/2019   CREATININE 0.79 07/04/2019   BUN 21 07/04/2019   CO2 29 07/04/2019   TSH 2.07 07/04/2019   HGBA1C 5.9 07/04/2019  revewied dexa scan normal   ASSESSMENT AND PLAN:  Discussed the following assessment and plan:  Visit for preventive health examination - Plan: Basic metabolic panel, CBC with Differential/Platelet, Hepatic function panel, Lipid panel, TSH, Hemoglobin A1c  Medication management - Plan: Basic metabolic panel, CBC with Differential/Platelet, Hepatic function panel, Lipid panel, TSH, Hemoglobin A1c  Hyperlipidemia, unspecified hyperlipidemia type - Plan: Basic metabolic panel, CBC with Differential/Platelet, Hepatic function panel, Lipid panel,  TSH, Hemoglobin A1c  Essential hypertension - Plan: Basic metabolic panel, CBC with Differential/Platelet, Hepatic function panel, Lipid panel, TSH, Hemoglobin A1c  History of ulcerative colitis - Plan: Basic metabolic panel, CBC with Differential/Platelet, Hepatic function panel, Lipid panel, TSH, Hemoglobin A1c  Ex-smoker - Plan: Basic metabolic panel, CBC with Differential/Platelet, Hepatic function panel, Lipid panel, TSH, Hemoglobin A1c  Hyperglycemia - Plan: Basic metabolic panel, CBC with Differential/Platelet, Hepatic function panel, Lipid panel, TSH, Hemoglobin A1c  Gastroesophageal reflux disease, unspecified whether esophagitis present - Plan: Basic metabolic panel, CBC with Differential/Platelet, Hepatic function panel, Lipid panel, TSH, Hemoglobin A1c  Rosacea Skin  Lesion  Uncertain      Watch and then fu if  persistent or progressive  Nocturnal  Sx and vomiting  At times   Trial of ppi and then as needed   Considered  Last  Pap  2012  Has had nl screening and no high risk  Ex smoker  ? Lung cancer screening ? Let us know if wishes to consider  The 10-year ASCVD risk score Mikey Bussing DC Brooke Bonito., et al., 2013) is: 13.2%   Values used to calculate the score:     Age: 43 years     Sex: Female     Is Non-Hispanic African American: No     Diabetic: No     Tobacco smoker: No     Systolic Blood Pressure: 629 mmHg     Is BP treated: Yes     HDL Cholesterol: 65.5 mg/dL     Total Cholesterol: 256 mg/dL  Patient Care Team: Burnis Medin, MD as PCP - General Mauri Pole, MD as Consulting Physician (Gastroenterology) Rolm Bookbinder, MD as Consulting Physician (Dermatology)  Patient Instructions   Agree avoid eat late at night  Try prilosec or  nexium otc  Daily for  2 weeks and  then as needed .    Let me know how this is doing in 3-4 weeks  If  persistent or progressive I may have dr Silverio Decamp opine.   Follow skin area on face and dont scratch this   If  persistent or progressive  may need more evaluation removal bx etc .   Can take a pix at baseline if needed.  Find out more about  The medication Metrogel  I can  Certainly giove the reasons for  MetroGel as rosacea if that will help .   Will notify you  of labs when available. Today and wil let you kinow result when available.    Will check dexa scan  Results.        Lung Cancer Screening A lung cancer screening is a test that checks for lung cancer. Lung cancer screening is done to look for lung cancer in its very early stages, before it spreads and becomes harder to treat and before symptoms appear. Finding cancer early improves the chances of successful treatment. It may save your life. Should I be screened for lung cancer? You should be screened for lung cancer if all of these apply:  You currently smoke or you have quit smoking within the past 15 years.  You are 61-60 years old. Screening may be recommended up to age 52 depending on your overall health and other factors.  You are in good general health.  You have a smoking history of 1 pack a day for 30 years or 2 packs a day for 15 years. Screening may also be recommended if you are at high risk for the disease. You may be at high risk if:  You have a family history of lung cancer.  You have been exposed to asbestos.  You have chronic obstructive pulmonary disease (COPD).  You have a history of previous lung cancer. How often should I be screened for lung cancer?  If you are at risk for lung cancer, it is recommended that you are screened once a year. The recommended screening test is a low-dose CT scan. How can I lower my risk of lung cancer? To lower your risk of developing lung cancer:  If you smoke, stop smoking all tobacco products.  Avoid secondhand smoke.  Avoid exposure to radiation.  Avoid exposure to radon gas. Have your home checked for radon regularly.  Avoid things that cause cancer (carcinogens).  Avoid living or working  in places with high air pollution. Where to find more information Ask your health care provider about the risks and benefits of screening. More information and resources are available from these organizations:  [place] (ACS): www.cancer.org  American Lung Association: www.lung.org Contact a health care provider if:  You start to show symptoms of lung cancer, including: ? Coughing that will not go away. ? Wheezing. ? Chest pain. ? Coughing up blood. ? Shortness of breath. ? Weight loss that cannot be explained. ? Constant fatigue. Summary  Lung cancer screening may find lung cancer before symptoms appear. Finding cancer early improves the chances of successful treatment. It may save your life.  If you are at risk for lung cancer, it is recommended that you are screened once a year. The recommended screening test is a low-dose CT scan.  You can make lifestyle changes to lower your risk of lung cancer.  Ask your health care provider about the risks and benefits of screening. This information is not intended to replace advice given to you  by your health care provider. Make sure you discuss any questions you have with your health care provider. Document Released: 07/23/2016 Document Revised: 12/24/2018 Document Reviewed: 07/23/2016 Elsevier Patient Education  2020 [place] Zeidy Tayag M.D.

## 2019-07-04 ENCOUNTER — Encounter: Payer: Self-pay | Admitting: Internal Medicine

## 2019-07-04 ENCOUNTER — Ambulatory Visit (INDEPENDENT_AMBULATORY_CARE_PROVIDER_SITE_OTHER): Payer: Medicare Other | Admitting: Internal Medicine

## 2019-07-04 ENCOUNTER — Other Ambulatory Visit: Payer: Self-pay

## 2019-07-04 VITALS — BP 122/68 | HR 76 | Temp 97.8°F | Ht 62.0 in | Wt 158.8 lb

## 2019-07-04 DIAGNOSIS — Z8719 Personal history of other diseases of the digestive system: Secondary | ICD-10-CM

## 2019-07-04 DIAGNOSIS — Z0000 Encounter for general adult medical examination without abnormal findings: Secondary | ICD-10-CM | POA: Diagnosis not present

## 2019-07-04 DIAGNOSIS — Z79899 Other long term (current) drug therapy: Secondary | ICD-10-CM | POA: Diagnosis not present

## 2019-07-04 DIAGNOSIS — I10 Essential (primary) hypertension: Secondary | ICD-10-CM | POA: Diagnosis not present

## 2019-07-04 DIAGNOSIS — E785 Hyperlipidemia, unspecified: Secondary | ICD-10-CM

## 2019-07-04 DIAGNOSIS — Z87891 Personal history of nicotine dependence: Secondary | ICD-10-CM

## 2019-07-04 DIAGNOSIS — K219 Gastro-esophageal reflux disease without esophagitis: Secondary | ICD-10-CM

## 2019-07-04 DIAGNOSIS — R739 Hyperglycemia, unspecified: Secondary | ICD-10-CM

## 2019-07-04 DIAGNOSIS — L719 Rosacea, unspecified: Secondary | ICD-10-CM

## 2019-07-04 LAB — BASIC METABOLIC PANEL
BUN: 21 mg/dL (ref 6–23)
CO2: 29 mEq/L (ref 19–32)
Calcium: 9.9 mg/dL (ref 8.4–10.5)
Chloride: 97 mEq/L (ref 96–112)
Creatinine, Ser: 0.79 mg/dL (ref 0.40–1.20)
GFR: 71.59 mL/min (ref >60.00)
Glucose, Bld: 81 mg/dL (ref 70–99)
Potassium: 4.2 mEq/L (ref 3.5–5.1)
Sodium: 136 mEq/L (ref 135–145)

## 2019-07-04 LAB — HEPATIC FUNCTION PANEL
ALT: 20 U/L (ref 0–35)
AST: 25 U/L (ref 0–37)
Albumin: 4.6 g/dL (ref 3.5–5.2)
Alkaline Phosphatase: 48 U/L (ref 39–117)
Bilirubin, Direct: 0.1 mg/dL (ref 0.0–0.3)
Total Bilirubin: 0.8 mg/dL (ref 0.2–1.2)
Total Protein: 7.2 g/dL (ref 6.0–8.3)

## 2019-07-04 LAB — LIPID PANEL
Cholesterol: 256 mg/dL — ABNORMAL HIGH (ref 0–200)
HDL: 65.5 mg/dL (ref >39.00)
LDL Cholesterol: 172 mg/dL — ABNORMAL HIGH (ref 0–99)
NonHDL: 190.21
Total CHOL/HDL Ratio: 4
Triglycerides: 93 mg/dL (ref 0.0–149.0)
VLDL: 18.6 mg/dL (ref 0.0–40.0)

## 2019-07-04 LAB — CBC WITH DIFFERENTIAL/PLATELET
Basophils Absolute: 0.1 10*3/uL (ref 0.0–0.1)
Basophils Relative: 0.5 % (ref 0.0–3.0)
Eosinophils Absolute: 0.4 10*3/uL (ref 0.0–0.7)
Eosinophils Relative: 3.4 % (ref 0.0–5.0)
HCT: 43.8 % (ref 36.0–46.0)
Hemoglobin: 14.4 g/dL (ref 12.0–15.0)
Lymphocytes Relative: 31.6 % (ref 12.0–46.0)
Lymphs Abs: 3.4 10*3/uL (ref 0.7–4.0)
MCHC: 32.8 g/dL (ref 30.0–36.0)
MCV: 94.2 fl (ref 78.0–100.0)
Monocytes Absolute: 0.8 10*3/uL (ref 0.1–1.0)
Monocytes Relative: 7.6 % (ref 3.0–12.0)
Neutro Abs: 6.1 10*3/uL (ref 1.4–7.7)
Neutrophils Relative %: 56.9 % (ref 43.0–77.0)
Platelets: 268 10*3/uL (ref 150.0–400.0)
RBC: 4.65 Mil/uL (ref 3.87–5.11)
RDW: 14.4 % (ref 11.5–15.5)
WBC: 10.7 10*3/uL — ABNORMAL HIGH (ref 4.0–10.5)

## 2019-07-04 LAB — HEMOGLOBIN A1C: Hgb A1c MFr Bld: 5.9 % (ref 4.6–6.5)

## 2019-07-04 LAB — TSH: TSH: 2.07 u[IU]/mL (ref 0.35–4.50)

## 2019-07-04 NOTE — Patient Instructions (Addendum)
Agree avoid eat late at night  Try prilosec or  nexium otc  Daily for  2 weeks and then as needed .    Let me know how this is doing in 3-4 weeks  If  persistent or progressive I may have dr Silverio Decamp opine.   Follow skin area on face and dont scratch this   If  persistent or progressive may need more evaluation removal bx etc .   Can take a pix at baseline if needed.  Find out more about  The medication Metrogel  I can  Certainly giove the reasons for  MetroGel as rosacea if that will help .   Will notify you  of labs when available. Today and wil let you kinow result when available.    Will check dexa scan  Results.        Lung Cancer Screening A lung cancer screening is a test that checks for lung cancer. Lung cancer screening is done to look for lung cancer in its very early stages, before it spreads and becomes harder to treat and before symptoms appear. Finding cancer early improves the chances of successful treatment. It may save your life. Should I be screened for lung cancer? You should be screened for lung cancer if all of these apply:  You currently smoke or you have quit smoking within the past 15 years.  You are 78-67 years old. Screening may be recommended up to age 54 depending on your overall health and other factors.  You are in good general health.  You have a smoking history of 1 pack a day for 30 years or 2 packs a day for 15 years. Screening may also be recommended if you are at high risk for the disease. You may be at high risk if:  You have a family history of lung cancer.  You have been exposed to asbestos.  You have chronic obstructive pulmonary disease (COPD).  You have a history of previous lung cancer. How often should I be screened for lung cancer?  If you are at risk for lung cancer, it is recommended that you are screened once a year. The recommended screening test is a low-dose CT scan. How can I lower my risk of lung cancer? To lower your  risk of developing lung cancer:  If you smoke, stop smoking all tobacco products.  Avoid secondhand smoke.  Avoid exposure to radiation.  Avoid exposure to radon gas. Have your home checked for radon regularly.  Avoid things that cause cancer (carcinogens).  Avoid living or working in places with high air pollution. Where to find more information Ask your health care provider about the risks and benefits of screening. More information and resources are available from these organizations:  [place] (ACS): www.cancer.org  American Lung Association: www.lung.org Contact a health care provider if:  You start to show symptoms of lung cancer, including: ? Coughing that will not go away. ? Wheezing. ? Chest pain. ? Coughing up blood. ? Shortness of breath. ? Weight loss that cannot be explained. ? Constant fatigue. Summary  Lung cancer screening may find lung cancer before symptoms appear. Finding cancer early improves the chances of successful treatment. It may save your life.  If you are at risk for lung cancer, it is recommended that you are screened once a year. The recommended screening test is a low-dose CT scan.  You can make lifestyle changes to lower your risk of lung cancer.  Ask your health care  provider about the risks and benefits of screening. This information is not intended to replace advice given to you by your health care provider. Make sure you discuss any questions you have with your health care provider. Document Released: 07/23/2016 Document Revised: 12/24/2018 Document Reviewed: 07/23/2016 Elsevier Patient Education  2020 [employer].

## 2019-07-11 ENCOUNTER — Encounter: Payer: Self-pay | Admitting: Internal Medicine

## 2019-07-14 ENCOUNTER — Ambulatory Visit: Payer: Self-pay

## 2019-07-14 DIAGNOSIS — E785 Hyperlipidemia, unspecified: Secondary | ICD-10-CM

## 2019-07-14 NOTE — Telephone Encounter (Signed)
Assuming we are talking about the crestor   Please send in crestor 20 mg 1 po qd disp 90 and refill x 1   Plan lipid panel in 3-4 months

## 2019-07-14 NOTE — Telephone Encounter (Signed)
Provided  Lab results per  Dr.  Regis Bill  Note of 07/10/19.  Pt.  Ask if her medication can be increased.  Would like a  Return call from Provider

## 2019-07-15 MED ORDER — ROSUVASTATIN CALCIUM 20 MG PO TABS: 20.0000 mg | ORAL_TABLET | Freq: Every day | ORAL | 0 refills | Status: DC

## 2019-07-15 NOTE — Telephone Encounter (Signed)
Medication sent in and left detailed message for pt

## 2019-08-19 ENCOUNTER — Other Ambulatory Visit: Payer: Self-pay

## 2019-08-19 ENCOUNTER — Ambulatory Visit: Payer: Self-pay

## 2019-08-19 DIAGNOSIS — Z20822 Contact with and (suspected) exposure to covid-19: Secondary | ICD-10-CM

## 2019-08-19 NOTE — Telephone Encounter (Signed)
Less likely from Crestor  medication  Than  other causes but if so  She can stop the crestor and when feels better  Retry it    And if still a problem make visit video ok to discuss  She should check her BP and temperature to make sure no other issues  And let us know what is happening   .

## 2019-08-19 NOTE — Telephone Encounter (Signed)
Please advise 

## 2019-08-19 NOTE — Telephone Encounter (Signed)
Pt. Reports she started taking her Crestor "around Thanksgiving and ever since, I have dizziness and vertigo mostly when I stand up." No other symptoms. Feels like Crestor is causing dizziness. Offered an appointment - refuses.Would like to know from Dr. Regis Bill what she thinks. Pt. Would like to go back on Lipitor. If Dr. Regis Bill wants her to have an office visit, she will. Please advise.  Answer Assessment - Initial Assessment Questions 1. DESCRIPTION: "Describe your dizziness."     Vertigo 2. VERTIGO: "Do you feel like either you or the room is spinning or tilting?"      Yes 3. LIGHTHEADED: "Do you feel lightheaded?" (e.g., somewhat faint, woozy, weak upon standing)     Dizzy 4. SEVERITY: "How bad is it?"  "Can you walk?"   - MILD - Feels unsteady but walking normally.   - MODERATE - Feels very unsteady when walking, but not falling; interferes with normal activities (e.g., school, work) .   - SEVERE - Unable to walk without falling (requires assistance).     Mild 5. ONSET:  "When did the dizziness begin?"     Started around Thanksgiving 6. AGGRAVATING FACTORS: "Does anything make it worse?" (e.g., standing, change in head position)     Standing up 7. CAUSE: "What do you think is causing the dizziness?"     Maybe her Crestor 8. RECURRENT SYMPTOM: "Have you had dizziness before?" If so, ask: "When was the last time?" "What happened that time?"     No 9. OTHER SYMPTOMS: "Do you have any other symptoms?" (e.g., headache, weakness, numbness, vomiting, earache)     No 10. PREGNANCY: "Is there any chance you are pregnant?" "When was your last menstrual period?"       No  Protocols used: DIZZINESS - VERTIGO-A-AH

## 2019-08-19 NOTE — Telephone Encounter (Signed)
Pt notified and has understanding

## 2019-08-21 LAB — NOVEL CORONAVIRUS, NAA: SARS-CoV-2, NAA: NOT DETECTED

## 2019-08-26 ENCOUNTER — Telehealth: Payer: Self-pay | Admitting: Internal Medicine

## 2019-08-26 NOTE — Telephone Encounter (Signed)
Verbalized to pt to give it more time and to check in around the 21st if still having dizziness if it gets worse advised pt to set up a visit or go to urgent care

## 2019-08-26 NOTE — Telephone Encounter (Signed)
Pt calling in with an update.  She is not taking meds and her bp is 116/70.  She is experiencing dizziness from time to time.  Not as bad as it was.  Pt would like to know if she just needs to gice it more time or something different.  Please advise.  cb is [phone]

## 2019-09-04 ENCOUNTER — Other Ambulatory Visit: Payer: Self-pay | Admitting: Internal Medicine

## 2019-09-19 ENCOUNTER — Other Ambulatory Visit: Payer: Self-pay | Admitting: Internal Medicine

## 2019-09-23 ENCOUNTER — Telehealth (INDEPENDENT_AMBULATORY_CARE_PROVIDER_SITE_OTHER): Payer: Medicare Other | Admitting: Internal Medicine

## 2019-09-23 ENCOUNTER — Encounter: Payer: Self-pay | Admitting: Internal Medicine

## 2019-09-23 ENCOUNTER — Other Ambulatory Visit: Payer: Self-pay

## 2019-09-23 ENCOUNTER — Telehealth: Payer: Self-pay | Admitting: *Deleted

## 2019-09-23 DIAGNOSIS — E785 Hyperlipidemia, unspecified: Secondary | ICD-10-CM

## 2019-09-23 DIAGNOSIS — R42 Dizziness and giddiness: Secondary | ICD-10-CM | POA: Diagnosis not present

## 2019-09-23 DIAGNOSIS — I10 Essential (primary) hypertension: Secondary | ICD-10-CM

## 2019-09-23 DIAGNOSIS — Z79899 Other long term (current) drug therapy: Secondary | ICD-10-CM | POA: Diagnosis not present

## 2019-09-23 MED ORDER — ROSUVASTATIN CALCIUM 10 MG PO TABS: 10.0000 mg | ORAL_TABLET | Freq: Every day | ORAL | 1 refills | Status: DC

## 2019-09-23 NOTE — Progress Notes (Signed)
Virtual Visit via Video Note  I connected with@ on 09/23/19 at  2:00 PM EST by a video enabled telemedicine application and verified that I am speaking with the correct person using two identifiers. Location patient: home Location provider:work  office Persons participating in the virtual visit: patient, provider  [place] national recommendations  regarding COVID 19 pandemic   video visit is advised over in office visit for this patient.  Patient aware  of the limitations of evaluation and management by telemedicine and  availability of in person appointments. and agreed to proceed.   HPI: Shelley Wright presents for video visit to go over her medications and assess her positional dizziness. She noted episode of vertigo dizziness when she bent over to pick up a frying pan and stood up and since that time has had some version of this when she gets up in the morning that is not long-lasting is not interfering but still happens At some point her blood pressure was low like in the 60 range so she stopped the blood pressure medication and is currently 136 or lower. She also stopped the Crestor as it had been increased to 20 mg and the is good citalopram.  Just in case there was a side effect of the medicine she wonders if the medication to cause the symptoms. Since that time she is a lot better but still has some symptoms and just wonders what could have been from No unusual headaches vision change neurologic symptoms falling balance problems numbness or weakness. At this point she only gets it when she rises first thing in the morning. No change in her hearing.  She her ears do bother her and she use a lot of Q-tips. Wonders if she should try going down on the 10 mg of the Crestor.    ROS: See pertinent positives and negatives per HPI.  No chest pain or new shortness of breath  Past Medical History:  Diagnosis Date  . Anxiety   . Arthritis   . GERD (gastroesophageal reflux disease)   .  History of smoking   . History of ulcerative colitis 2005   pancolitis   . Hyperlipidemia   . Hypertension   . Normal nuclear stress test 2008   myoview     Past Surgical History:  Procedure Laterality Date  . BREAST BIOPSY Left 1986   biopsy x 2   . COLONOSCOPY    . KNEE SURGERY Left october   x 2    Family History  Problem Relation Age of Onset  . Pancreatic cancer Father        deceased  . Hypertension Father   . Breast cancer Mother 48  . Angina Mother   . Hypertension Mother   . Colon cancer Neg Hx   . Colon polyps Neg Hx   . Rectal cancer Neg Hx   . Stomach cancer Neg Hx     Social History   Tobacco Use  . Smoking status: Former Smoker    Quit date: 07/04/2014    Years since quitting: 5.2  . Smokeless tobacco: Never Used  Substance Use Topics  . Alcohol use: No    Alcohol/week: 0.0 standard drinks  . Drug use: No      Current Outpatient Medications:  .  metroNIDAZOLE (METROGEL) 0.75 % gel, APPLY TOPICALLY 2 (TWO)  TIMES DAILY., Disp: 135 g, Rfl: 0 .  rosuvastatin (CRESTOR) 10 MG tablet, Take 1 tablet (10 mg total) by mouth daily., Disp: 90 tablet,  Rfl: 1  Current Facility-Administered Medications:  .  0.9 %  sodium chloride infusion, 500 mL, Intravenous, Continuous, Nandigam, Kavitha V, MD  EXAM: BP Readings from Last 3 Encounters:  07/04/19 122/68  11/05/18 104/64  06/04/18 100/66    VITALS per patient if applicable:  GENERAL: alert, oriented, appears well and in no acute distress  HEENT: atraumatic, conjunttiva clear, no obvious abnormalities on inspection of external nose and ears  NECK: normal movements of the head and neck  LUNGS: on inspection no signs of respiratory distress, breathing rate appears normal, no obvious gross SOB, gasping or wheezing  CV: no obvious cyanosis  PSYCH/NEURO: pleasant and cooperative, no obvious depression or anxiety, speech and thought processing grossly intact Lab Results  Component Value Date   WBC  10.7 (H) 07/04/2019   HGB 14.4 07/04/2019   HCT 43.8 07/04/2019   PLT 268.0 07/04/2019   GLUCOSE 81 07/04/2019   CHOL 256 (H) 07/04/2019   TRIG 93.0 07/04/2019   HDL 65.50 07/04/2019   LDLDIRECT 138.5 10/17/2013   LDLCALC 172 (H) 07/04/2019   ALT 20 07/04/2019   AST 25 07/04/2019   NA 136 07/04/2019   K 4.2 07/04/2019   CL 97 07/04/2019   CREATININE 0.79 07/04/2019   BUN 21 07/04/2019   CO2 29 07/04/2019   TSH 2.07 07/04/2019   HGBA1C 5.9 07/04/2019    ASSESSMENT AND PLAN:  Discussed the following assessment and plan:    ICD-10-CM   1. Dizziness  R42   2. Medication management  Z79.899   3. Essential hypertension  I10    reported control off of medication consdier back on lower dose if needed   4. Hyperlipidemia, unspecified hyperlipidemia type  E78.5    Truly sounds like benign positional vertigo and is only temporary under certain conditionsposition change  without associated symptoms;no obvious evidence of CNS symptoms with this suspicion that her blood pressure was too low at some point on medication.  And it was reasonable to stop all the above. However at this time we will try going back to 10 mg of rosuvastatin once a day and have her check her lipid panel in 2 to 3 months.  Consideration of physical therapy regarding possible Bpvv but sx are not so much interfering as  To the cause  Also discussed as best possible questions about the Covid vaccine.  She could follow-up in 2 to 3 months or earlier if concerns with symptoms. Counseled.   Expectant management and discussion of plan and treatment with opportunity to ask questions and all were answered. The patient agreed with the plan and demonstrated an understanding of the instructions.   Advised to call back or seek an in-person evaluation if worsening  or having  further concerns . Return for lipid panel  and fu in 2-3 months or as needed .   Shanon Ace, MD

## 2019-09-23 NOTE — Telephone Encounter (Signed)
Copied from [place] [id number]. Topic: General - Other >> Sep 23, 2019 11:08 AM Yvette Rack wrote: Reason for CRM: Pt stated she has been communicating with the Dr. Velora Mediate nurse and she would like a call back asap. Cb# [phone]

## 2019-09-23 NOTE — Telephone Encounter (Signed)
Pt scheduled for virtual visit 

## 2019-09-26 ENCOUNTER — Ambulatory Visit: Payer: Medicare Other | Attending: Internal Medicine

## 2019-09-26 DIAGNOSIS — Z20822 Contact with and (suspected) exposure to covid-19: Secondary | ICD-10-CM

## 2019-09-27 LAB — NOVEL CORONAVIRUS, NAA: SARS-CoV-2, NAA: NOT DETECTED

## 2019-10-07 ENCOUNTER — Ambulatory Visit: Payer: Medicare Other | Attending: Internal Medicine

## 2019-10-07 DIAGNOSIS — Z23 Encounter for immunization: Secondary | ICD-10-CM | POA: Insufficient documentation

## 2019-10-07 NOTE — Progress Notes (Signed)
   Covid-19 Vaccination Clinic  Name:  Shelley Wright    MRN: [id number] DOB: July 19, 1946  10/07/2019  Shelley Wright was observed post Covid-19 immunization for 15 minutes without incidence. She was provided with Vaccine Information Sheet and instruction to access the V-Safe system.   Shelley Wright was instructed to call 911 with any severe reactions post vaccine: Marland Kitchen Difficulty breathing  . Swelling of your face and throat  . A fast heartbeat  . A bad rash all over your body  . Dizziness and weakness    Immunizations Administered    Name Date Dose VIS Date Route   Pfizer COVID-19 Vaccine 10/07/2019  1:16 PM 0.3 mL 08/26/2019 Intramuscular   Manufacturer: [place]   Lot: [id number]   [place]: [address]-5

## 2019-10-21 ENCOUNTER — Other Ambulatory Visit: Payer: Self-pay | Admitting: Internal Medicine

## 2019-10-21 ENCOUNTER — Other Ambulatory Visit: Payer: Self-pay

## 2019-10-21 ENCOUNTER — Ambulatory Visit (AMBULATORY_SURGERY_CENTER): Payer: Self-pay | Admitting: *Deleted

## 2019-10-21 VITALS — Temp 96.2°F | Ht 62.0 in | Wt 160.0 lb

## 2019-10-21 DIAGNOSIS — K5100 Ulcerative (chronic) pancolitis without complications: Secondary | ICD-10-CM

## 2019-10-21 MED ORDER — SUPREP BOWEL PREP KIT 17.5-3.13-1.6 GM/177ML PO SOLN: ORAL | 0 refills | Status: DC

## 2019-10-21 NOTE — Progress Notes (Signed)
Pt is aware that care partner will wait in the car during procedure; if they feel like they will be too hot or cold to wait in the car; they may wait in the 4 th floor lobby. Patient is aware to bring only one care partner. We want them to wear a mask (we do not have any that we can provide them), practice social distancing, and we will check their temperatures when they get here.  I did remind the patient that their care partner needs to stay in the parking lot the entire time and have a cell phone available, we will call them when the pt is ready for discharge. Patient will wear mask into building.  Pt will have second covid vaccine on 10-28-19- she will be two weeks past vaccine at the time of her procedure so will not need a covid test   No egg or soy allergy  No home oxygen use or problems with anesthesia  No medications for weight loss taken  emmi information given

## 2019-10-28 ENCOUNTER — Ambulatory Visit: Payer: Medicare Other | Attending: Internal Medicine

## 2019-10-28 DIAGNOSIS — Z23 Encounter for immunization: Secondary | ICD-10-CM | POA: Insufficient documentation

## 2019-10-28 NOTE — Progress Notes (Signed)
   Covid-19 Vaccination Clinic  Name:  Shelley Wright    MRN: [id number] DOB: 01-13-1946  10/28/2019  [name] was observed post Covid-19 immunization for 15 minutes without incidence. She was provided with Vaccine Information Sheet and instruction to access the V-Safe system.   [name] was instructed to call 911 with any severe reactions post vaccine: Marland Kitchen Difficulty breathing  . Swelling of your face and throat  . A fast heartbeat  . A bad rash all over your body  . Dizziness and weakness    Immunizations Administered    Name Date Dose VIS Date Route   Pfizer COVID-19 Vaccine 10/28/2019 11:12 AM 0.3 mL 08/26/2019 Intramuscular   Manufacturer: [place]   Lot: [id number]   [place]: [id number]

## 2019-11-03 ENCOUNTER — Encounter: Payer: Self-pay | Admitting: Gastroenterology

## 2019-11-04 ENCOUNTER — Encounter: Payer: Medicare Other | Admitting: Gastroenterology

## 2019-11-11 ENCOUNTER — Encounter: Payer: Medicare Other | Admitting: Gastroenterology

## 2019-11-11 ENCOUNTER — Telehealth: Payer: Self-pay | Admitting: Gastroenterology

## 2019-11-11 NOTE — Telephone Encounter (Signed)
Patient late for procedure. Called to confirm she was on her way. Patient stated that she was not aware the appointment was today. Patient asked for [place] to call her to reschedule.

## 2019-12-30 ENCOUNTER — Encounter: Payer: Self-pay | Admitting: Gastroenterology

## 2019-12-30 ENCOUNTER — Ambulatory Visit (AMBULATORY_SURGERY_CENTER): Payer: Medicare Other | Admitting: Gastroenterology

## 2019-12-30 ENCOUNTER — Other Ambulatory Visit: Payer: Self-pay

## 2019-12-30 VITALS — BP 125/70 | HR 83 | Temp 97.3°F | Resp 15 | Ht 62.0 in | Wt 160.0 lb

## 2019-12-30 DIAGNOSIS — D120 Benign neoplasm of cecum: Secondary | ICD-10-CM

## 2019-12-30 DIAGNOSIS — K529 Noninfective gastroenteritis and colitis, unspecified: Secondary | ICD-10-CM | POA: Diagnosis not present

## 2019-12-30 DIAGNOSIS — K5140 Inflammatory polyps of colon without complications: Secondary | ICD-10-CM | POA: Diagnosis not present

## 2019-12-30 DIAGNOSIS — K5100 Ulcerative (chronic) pancolitis without complications: Secondary | ICD-10-CM | POA: Diagnosis not present

## 2019-12-30 DIAGNOSIS — Z86010 Personal history of colonic polyps: Secondary | ICD-10-CM

## 2019-12-30 MED ORDER — SODIUM CHLORIDE 0.9 % IV SOLN: 500.0000 mL | Freq: Once | INTRAVENOUS | Status: DC

## 2019-12-30 NOTE — Op Note (Signed)
[place] Patient Name: Shelley Wright Procedure Date: 12/30/2019 2:00 PM MRN: [id number] Endoscopist: Mauri Pole , MD Age: 74 Referring MD:  Date of Birth: [birth year] Gender: Female Account #: [id number] Procedure:                Colonoscopy Indications:              High risk colon cancer surveillance: Personal                            history of colonic polyps, High risk colon cancer                            surveillance: Personal history of adenoma less than                            10 mm in size, High risk colon cancer surveillance:                            Ulcerative pancolitis of 8 (or more) years duration Medicines:                Monitored Anesthesia Care Procedure:                Pre-Anesthesia Assessment:                           - Prior to the procedure, a History and Physical                            was performed, and patient medications and                            allergies were reviewed. The patient's tolerance of                            previous anesthesia was also reviewed. The risks                            and benefits of the procedure and the sedation                            options and risks were discussed with the patient.                            All questions were answered, and informed consent                            was obtained. Prior Anticoagulants: The patient has                            taken no previous anticoagulant or antiplatelet                            agents. ASA Grade Assessment: II - A patient with  mild systemic disease. After reviewing the risks                            and benefits, the patient was deemed in                            satisfactory condition to undergo the procedure.                           After obtaining informed consent, the colonoscope                            was passed under direct vision. Throughout the   procedure, the patient's blood pressure, pulse, and                            oxygen saturations were monitored continuously. The                            Colonoscope was introduced through the anus and                            advanced to the the terminal ileum, with                            identification of the appendiceal orifice and IC                            valve. The colonoscopy was performed without                            difficulty. The patient tolerated the procedure                            well. The quality of the bowel preparation was                            excellent. The terminal ileum, ileocecal valve,                            appendiceal orifice, and rectum were photographed. Scope In: 2:07:08 PM Scope Out: 2:26:00 PM Scope Withdrawal Time: 0 hours 10 minutes 55 seconds  Total Procedure Duration: 0 hours 18 minutes 52 seconds  Findings:                 The perianal and digital rectal examinations were                            normal.                           A less than 1 mm polyp was found in the cecum. The                            polyp was sessile. The  polyp was removed with a                            cold biopsy forceps. Resection and retrieval were                            complete.                           Inflammation characterized by altered vascularity,                            loss of vascularity, pseudopolyps and scarring was                            found as small patches surrounded by normal mucosa                            in the rectum, in the recto-sigmoid colon, in the                            sigmoid colon, in the descending colon, in the                            splenic flexure, in the transverse colon, in the                            hepatic flexure, in the ascending colon and at the                            cecum. This was mild in severity, and when compared                            to previous  examinations, the findings are                            quiescent. Biopsies were taken with a cold forceps                            for histology.                           Multiple small and large-mouthed diverticula were                            found in the sigmoid colon.                           Non-bleeding internal hemorrhoids were found during                            retroflexion. The hemorrhoids were small. Complications:            No immediate complications. Estimated Blood Loss:     Estimated blood loss was minimal. Impression:               -  One less than 1 mm polyp in the cecum, removed                            with a cold biopsy forceps. Resected and retrieved.                           - Pancolitis ulcerative colitis and quiescent                            ulcerative colitis. Inflammation was found in the                            rectum, in the recto-sigmoid colon, in the sigmoid                            colon, in the descending colon, in the splenic                            flexure, in the transverse colon, in the hepatic                            flexure, in the ascending colon and at the cecum.                            This was mild in severity, quiescent compared to                            previous examinations. Biopsied.                           - Diverticulosis in the sigmoid colon.                           - Non-bleeding internal hemorrhoids. Recommendation:           - Patient has a contact number available for                            emergencies. The signs and symptoms of potential                            delayed complications were discussed with the                            patient. Return to normal activities tomorrow.                            Written discharge instructions were provided to the                            patient.                           - Resume previous diet.                           -  Continue present  medications.                           - Await pathology results.                           - Repeat colonoscopy in 3 years for surveillance                            based on pathology results.                           - Return to GI clinic at the next available                            appointment. Please call [phone] to schedule                            appointment for follow up of ulcerative colitis. Mauri Pole, MD 12/30/2019 2:33:46 PM This report has been signed electronically.

## 2019-12-30 NOTE — Progress Notes (Signed)
Pt's states no medical or surgical changes since previsit or office visit. 

## 2019-12-30 NOTE — Progress Notes (Signed)
pt tolerated well. VSS. awake and to recovery. Report given to RN.  

## 2019-12-30 NOTE — Progress Notes (Signed)
Temperature taken by J.B., VS taken by K.A.

## 2019-12-30 NOTE — Patient Instructions (Signed)
YOU HAD AN ENDOSCOPIC PROCEDURE TODAY AT THE [facility]:   Refer to the procedure report that was given to you for any specific questions about what was found during the examination.  If the procedure report does not answer your questions, please call your gastroenterologist to clarify.  If you requested that your care partner not be given the details of your procedure findings, then the procedure report has been included in a sealed envelope for you to review at your convenience later.  YOU SHOULD EXPECT: Some feelings of bloating in the abdomen. Passage of more gas than usual.  Walking can help get rid of the air that was put into your GI tract during the procedure and reduce the bloating. If you had a lower endoscopy (such as a colonoscopy or flexible sigmoidoscopy) you may notice spotting of blood in your stool or on the toilet paper. If you underwent a bowel prep for your procedure, you may not have a normal bowel movement for a few days.  Please Note:  You might notice some irritation and congestion in your nose or some drainage.  This is from the oxygen used during your procedure.  There is no need for concern and it should clear up in a day or so.  SYMPTOMS TO REPORT IMMEDIATELY:   Following lower endoscopy (colonoscopy or flexible sigmoidoscopy):  Excessive amounts of blood in the stool  Significant tenderness or worsening of abdominal pains  Swelling of the abdomen that is new, acute  Fever of 100F or higher  For urgent or emergent issues, a gastroenterologist can be reached at any hour by calling [phone]. Do not use MyChart messaging for urgent concerns.    DIET:  We do recommend a small meal at first, but then you may proceed to your regular diet.  Drink plenty of fluids but you should avoid alcoholic beverages for 24 hours.  ACTIVITY:  You should plan to take it easy for the rest of today and you should NOT DRIVE or use heavy machinery until tomorrow (because  of the sedation medicines used during the test).    FOLLOW UP: Our staff will call the number listed on your records 48-72 hours following your procedure to check on you and address any questions or concerns that you may have regarding the information given to you following your procedure. If we do not reach you, we will leave a message.  We will attempt to reach you two times.  During this call, we will ask if you have developed any symptoms of COVID 19. If you develop any symptoms (ie: fever, flu-like symptoms, shortness of breath, cough etc.) before then, please call [phone].  If you test positive for Covid 19 in the 2 weeks post procedure, please call and report this information to us.    If any biopsies were taken you will be contacted by phone or by letter within the next 1-3 weeks.  Please call us at [phone] if you have not heard about the biopsies in 3 weeks.    SIGNATURES/CONFIDENTIALITY: You and/or your care partner have signed paperwork which will be entered into your electronic medical record.  These signatures attest to the fact that that the information above on your After Visit Summary has been reviewed and is understood.  Full responsibility of the confidentiality of this discharge information lies with you and/or your care-partner. 

## 2020-01-03 ENCOUNTER — Telehealth: Payer: Self-pay

## 2020-01-03 NOTE — Telephone Encounter (Signed)
  Follow up Call-  Call back number 12/30/2019 09/25/2017  Post procedure Call Back phone  # [phone]  Permission to leave phone message Yes Yes  Some recent data might be hidden     Patient questions:  Do you have a fever, pain , or abdominal swelling? No. Pain Score  0 *  Have you tolerated food without any problems? Yes.    Have you been able to return to your normal activities? Yes.    Do you have any questions about your discharge instructions: Diet   No. Medications  No. Follow up visit  No.  Do you have questions or concerns about your Care? No.  Actions: * If pain score is 4 or above: 1. No action needed, pain <4.Have you developed a fever since your procedure? no  2.   Have you had an respiratory symptoms (SOB or cough) since your procedure? no  3.   Have you tested positive for COVID 19 since your procedure no  4.   Have you had any family members/close contacts diagnosed with the COVID 19 since your procedure?  no   If yes to any of these questions please route to Joylene John, RN and Erenest Rasher, RN

## 2020-01-05 ENCOUNTER — Other Ambulatory Visit: Payer: Self-pay

## 2020-01-09 ENCOUNTER — Encounter: Payer: Self-pay | Admitting: Gastroenterology

## 2020-01-27 ENCOUNTER — Ambulatory Visit: Payer: Medicare Other | Admitting: Gastroenterology

## 2020-02-15 ENCOUNTER — Telehealth: Payer: Self-pay

## 2020-02-15 NOTE — Telephone Encounter (Signed)
Called patient and left a voice message for patient to call back to confirm if she is using the [facility] mail service pharmacy. I received a request for her Rosuvastatin and she last received from [facility] and need to confirm which pharmacy she would like to use.

## 2020-02-27 DIAGNOSIS — M25552 Pain in left hip: Secondary | ICD-10-CM | POA: Diagnosis not present

## 2020-02-27 DIAGNOSIS — M7062 Trochanteric bursitis, left hip: Secondary | ICD-10-CM | POA: Diagnosis not present

## 2020-03-01 ENCOUNTER — Other Ambulatory Visit: Payer: Self-pay | Admitting: Internal Medicine

## 2020-03-02 NOTE — Telephone Encounter (Signed)
Called patient and left a detailed voice message to call back and let her know that I do not see this medication on her med list and she is due for a follow up. I will need to discuss with her.

## 2020-04-11 ENCOUNTER — Other Ambulatory Visit: Payer: Self-pay

## 2020-04-11 MED ORDER — ROSUVASTATIN CALCIUM 10 MG PO TABS: 10.0000 mg | ORAL_TABLET | Freq: Every day | ORAL | 0 refills | Status: DC

## 2020-04-27 DIAGNOSIS — M7062 Trochanteric bursitis, left hip: Secondary | ICD-10-CM | POA: Diagnosis not present

## 2020-04-27 DIAGNOSIS — M25552 Pain in left hip: Secondary | ICD-10-CM | POA: Diagnosis not present

## 2020-06-11 DIAGNOSIS — Z1231 Encounter for screening mammogram for malignant neoplasm of breast: Secondary | ICD-10-CM | POA: Diagnosis not present

## 2020-06-11 LAB — HM MAMMOGRAPHY

## 2020-06-13 DIAGNOSIS — M7062 Trochanteric bursitis, left hip: Secondary | ICD-10-CM | POA: Diagnosis not present

## 2020-07-04 DIAGNOSIS — H2513 Age-related nuclear cataract, bilateral: Secondary | ICD-10-CM | POA: Diagnosis not present

## 2020-07-04 DIAGNOSIS — H5203 Hypermetropia, bilateral: Secondary | ICD-10-CM | POA: Diagnosis not present

## 2020-07-17 ENCOUNTER — Ambulatory Visit (INDEPENDENT_AMBULATORY_CARE_PROVIDER_SITE_OTHER): Payer: Medicare Other

## 2020-07-17 ENCOUNTER — Other Ambulatory Visit: Payer: Self-pay

## 2020-07-17 DIAGNOSIS — Z0000 Encounter for general adult medical examination without abnormal findings: Secondary | ICD-10-CM

## 2020-07-17 NOTE — Progress Notes (Signed)
Virtual Visit via Telephone Note  I connected with  Shelley Wright on 07/17/20 at  3:15 PM EDT by telephone and verified that I am speaking with the correct person using two identifiers.  Medicare Annual Wellness visit completed telephonically due to Covid-19 pandemic.   Persons participating in this call: This Health Coach and this patient.   Location: Patient: Home  Provider: Office   I discussed the limitations, risks, security and privacy concerns of performing an evaluation and management service by telephone and the availability of in person appointments. The patient expressed understanding and agreed to proceed.  Unable to perform video visit due to video visit attempted and failed and/or patient does not have video capability.   Some vital signs may be absent or patient reported.   Willette Brace, LPN    Subjective:   Shelley Wright is a 74 y.o. female who presents for Medicare Annual (Subsequent) preventive examination.  Review of Systems     Cardiac Risk Factors include: advanced age (>37mn, >>64women);dyslipidemia;hypertension     Objective:    Today's Vitals   07/17/20 1513  PainSc: 5    There is no height or weight on file to calculate BMI.  Advanced Directives 07/17/2020 09/21/2015  Does Patient Have a Medical Advance Directive? Yes Yes  Type of [place] -  Copy of [place] Chart? No - copy requested No - copy requested    Current Medications (verified) Outpatient Encounter Medications as of 07/17/2020  Medication Sig  . escitalopram (LEXAPRO) 10 MG tablet TAKE 1/2 TO 1 TABLET BY  MOUTH DAILY  . lisinopril (ZESTRIL) 10 MG tablet Take 10 mg by mouth daily.  . rosuvastatin (CRESTOR) 10 MG tablet Take 1 tablet (10 mg total) by mouth daily.  . [DISCONTINUED] metroNIDAZOLE (METROGEL) 0.75 % gel APPLY TOPICALLY 2 (TWO)  TIMES DAILY. (Patient not taking: Reported on 07/17/2020)   No  facility-administered encounter medications on file as of 07/17/2020.    Allergies (verified) Patient has no known allergies.   History: Past Medical History:  Diagnosis Date  . Anxiety    no per pt  . Arthritis   . GERD (gastroesophageal reflux disease)   . Heart murmur    'YEARS AGO"  . History of smoking   . History of ulcerative colitis 2005   pancolitis   . Hyperlipidemia   . Hypertension   . Normal nuclear stress test 2008   myoview    Past Surgical History:  Procedure Laterality Date  . BREAST BIOPSY Left 1986   biopsy x 2   . COLONOSCOPY    . KNEE SURGERY Left october   x 2   Family History  Problem Relation Age of Onset  . Pancreatic cancer Father        deceased  . Hypertension Father   . Breast cancer Mother 871 . Angina Mother   . Hypertension Mother   . Colon cancer Neg Hx   . Colon polyps Neg Hx   . Rectal cancer Neg Hx   . Stomach cancer Neg Hx    Social History   Socioeconomic History  . Marital status: Married    Spouse name: Not on file  . Number of children: 4  . Years of education: Not on file  . Highest education level: Not on file  Occupational History  . Not on file  Tobacco Use  . Smoking status: Former Smoker  Quit date: 07/04/2014    Years since quitting: 6.0  . Smokeless tobacco: Never Used  Vaping Use  . Vaping Use: Never used  Substance and Sexual Activity  . Alcohol use: No    Alcohol/week: 0.0 standard drinks  . Drug use: No  . Sexual activity: Not on file  Other Topics Concern  . Not on file  Social History Narrative   Hh  Of 2    Pet dog    Off tobacco.   [employer].     Teacher runs school.   26 - 3    G4 P4    Husband had cva and blocked carotids    Social Determinants of Health   Financial Resource Strain: Low Risk   . Difficulty of Paying Living Expenses: Not hard at all  Food Insecurity: No Food Insecurity  . Worried About Charity fundraiser in the Last Year: Never true  . Ran Out of Food  in the Last Year: Never true  Transportation Needs: No Transportation Needs  . Lack of Transportation (Medical): No  . Lack of Transportation (Non-Medical): No  Physical Activity: Inactive  . Days of Exercise per Week: 0 days  . Minutes of Exercise per Session: 0 min  Stress: No Stress Concern Present  . Feeling of Stress : Not at all  Social Connections: Moderately Integrated  . Frequency of Communication with Friends and Family: More than three times a week  . Frequency of Social Gatherings with Friends and Family: More than three times a week  . Attends Religious Services: More than 4 times per year  . Active Member of Clubs or Organizations: No  . Attends Archivist Meetings: Never  . Marital Status: Married    Tobacco Counseling Counseling given: Not Answered   Clinical Intake:  Pre-visit preparation completed: Yes  Pain : 0-10 (back legs and knee) Pain Score: 5  Pain Type: Chronic pain Pain Location: Back Pain Descriptors / Indicators: Aching, Sore (moving a certain way agrivates it) Pain Onset: More than a month ago Pain Frequency: Intermittent     BMI - recorded: 29.26 Nutritional Status: BMI 25 -29 Overweight Nutritional Risks: None Diabetes: No  How often do you need to have someone help you when you read instructions, pamphlets, or other written materials from your doctor or pharmacy?: 1 - Never  Diabetic?No  Interpreter Needed?: No  Information entered by :: Charlott Rakes, LPN   Activities of Daily Living In your present state of health, do you have any difficulty performing the following activities: 07/17/2020  Hearing? N  Vision? N  Difficulty concentrating or making decisions? N  Walking or climbing stairs? Y  Comment related to knees  Dressing or bathing? N  Doing errands, shopping? N  Preparing Food and eating ? N  Using the Toilet? N  In the past six months, have you accidently leaked urine? Y  Comment wait to long to go and  then accident may happen  Do you have problems with loss of bowel control? N  Managing your Medications? N  Managing your Finances? N  Housekeeping or managing your Housekeeping? N  Some recent data might be hidden    Patient Care Team: Panosh, Standley Brooking, MD as PCP - General Mauri Pole, MD as Consulting Physician (Gastroenterology) Rolm Bookbinder, MD as Consulting Physician (Dermatology)  Indicate any recent Medical Services you may have received from other than [facility] providers in the past year (date may be approximate).  Assessment:   This is a routine wellness examination for Shelley Wright.  Hearing/Vision screen  Hearing Screening   125Hz  250Hz  500Hz  1000Hz  2000Hz  3000Hz  4000Hz  6000Hz  8000Hz   Right ear:           Left ear:           Comments: Pt denies any hearing issues at this time  Vision Screening Comments: Follows up with Dr Prudencio Burly for annual eye exams  Dietary issues and exercise activities discussed: Current Exercise Habits: Home exercise routine, Type of exercise: walking, Time (Minutes): 30, Frequency (Times/Week): 2, Weekly Exercise (Minutes/Week): 60  Goals    . Patient Stated     Lose weight    . Quit smoking / using tobacco      Depression Screen PHQ 2/9 Scores 07/17/2020 07/04/2019 06/04/2018 06/02/2017 03/26/2016 11/01/2014 10/18/2013  PHQ - 2 Score 0 0 0 0 0 0 0    Fall Risk Fall Risk  07/17/2020 07/04/2019 06/04/2018 06/02/2017 03/26/2016  Falls in the past year? 0 0 No No No  Comment - - - - -  Number falls in past yr: 0 0 - - -  Injury with Fall? 0 0 - - -  Risk for fall due to : Impaired vision - - - -  Follow up Falls prevention discussed Falls evaluation completed - - -    Any stairs in or around the home? Yes  If so, are there any without handrails? No  Home free of loose throw rugs in walkways, pet beds, electrical cords, etc? Yes  Adequate lighting in your home to reduce risk of falls? Yes   ASSISTIVE DEVICES UTILIZED TO PREVENT FALLS:  Life  alert? No  Use of a cane, walker or w/c? No  Grab bars in the bathroom? No  Shower chair or bench in shower? Yes  Elevated toilet seat or a handicapped toilet? No   TIMED UP AND GO:  Was the test performed? No .    Cognitive Function:     6CIT Screen 07/17/2020  What Year? 0 points  What month? 0 points  Count back from 20 0 points  Months in reverse 0 points  Repeat phrase 0 points    Immunizations Immunization History  Administered Date(s) Administered  . Influenza Split 06/13/2011, 07/05/2012, 06/15/2013  . Influenza, High Dose Seasonal PF 06/02/2017, 05/28/2019  . Influenza,inj,Quad PF,6+ Mos 06/04/2018  . Influenza-Unspecified 06/24/2020  . PFIZER SARS-COV-2 Vaccination 10/07/2019, 10/28/2019, 06/24/2020  . Pneumococcal Conjugate-13 10/18/2013  . Pneumococcal Polysaccharide-23 11/01/2014    TDAP status: Due, Education has been provided regarding the importance of this vaccine. Advised may receive this vaccine at local pharmacy or Health Dept. Aware to provide a copy of the vaccination record if obtained from local pharmacy or Health Dept. Verbalized acceptance and understanding. Flu Vaccine status: Up to date   Pneumococcal vaccine status: Up to date Covid-19 vaccine status: Completed vaccines  Qualifies for Shingles Vaccine? Yes   Zostavax completed No   Shingrix Completed?: No.    Education has been provided regarding the importance of this vaccine. Patient has been advised to call insurance company to determine out of pocket expense if they have not yet received this vaccine. Advised may also receive vaccine at local pharmacy or Health Dept. Verbalized acceptance and understanding.  Screening Tests Health Maintenance  Topic Date Due  . TETANUS/TDAP  09/15/2016  . MAMMOGRAM  06/11/2022  . COLONOSCOPY  12/30/2022  . INFLUENZA VACCINE  Completed  . DEXA SCAN  Completed  .  COVID-19 Vaccine  Completed  . Hepatitis C Screening  Completed  . PNA vac Low Risk  Adult  Completed    Health Maintenance  Health Maintenance Due  Topic Date Due  . TETANUS/TDAP  09/15/2016    Colorectal cancer screening: Completed 12/30/19. Repeat every 3 years Mammogram status: Completed 06/11/20. Repeat every year Bone Density status: Completed 06/08/19. Results reflect: Bone density results: NORMAL. Repeat every 2 years.   Additional Screening:  Hepatitis C Screening:Completed 06/02/17  Vision Screening: Recommended annual ophthalmology exams for early detection of glaucoma and other disorders of the eye. Is the patient up to date with their annual eye exam?  Yes  Who is the provider or what is the name of the office in which the patient attends annual eye exams? Dr Prudencio Burly  Dental Screening: Recommended annual dental exams for proper oral hygiene  Community Resource Referral / Chronic Care Management: CRR required this visit?  No   CCM required this visit?  No      Plan:     I have personally reviewed and noted the following in the patient's chart:   . Medical and social history . Use of alcohol, tobacco or illicit drugs  . Current medications and supplements . Functional ability and status . Nutritional status . Physical activity . Advanced directives . List of other physicians . Hospitalizations, surgeries, and ER visits in previous 12 months . Vitals . Screenings to include cognitive, depression, and falls . Referrals and appointments  In addition, I have reviewed and discussed with patient certain preventive protocols, quality metrics, and best practice recommendations. A written personalized care plan for preventive services as well as general preventive health recommendations were provided to patient.     Willette Brace, LPN   [id number]   Nurse Notes: None

## 2020-07-17 NOTE — Patient Instructions (Addendum)
Shelley Wright , Thank you for taking time to come for your Medicare Wellness Visit. I appreciate your ongoing commitment to your health goals. Please review the following plan we discussed and let me know if I can assist you in the future.   Screening recommendations/referrals: Colonoscopy: Done 12/30/19 Mammogram: Done 06/11/20 Bone Density: Done 06/08/19 Recommended yearly ophthalmology/optometry visit for glaucoma screening and checkup Recommended yearly dental visit for hygiene and checkup  Vaccinations: Influenza vaccine: Done 06/24/20 Up to date Pneumococcal vaccine: Up to date Tdap vaccine: Due and discussed Shingles vaccine: Shingrix discussed. Please contact your pharmacy for coverage information.  Covid-19:Completed 1/22 & 10/28/19  Advanced directives: Please bring a copy of your health care power of attorney and living will to the office at your convenience.   Conditions/risks identified: Lose weight  Next appointment: Follow up in one year for your annual wellness visit    Preventive Care 65 Years and Older, Female Preventive care refers to lifestyle choices and visits with your health care provider that can promote health and wellness. What does preventive care include?  A yearly physical exam. This is also called an annual well check.  Dental exams once or twice a year.  Routine eye exams. Ask your health care provider how often you should have your eyes checked.  Personal lifestyle choices, including:  Daily care of your teeth and gums.  Regular physical activity.  Eating a healthy diet.  Avoiding tobacco and drug use.  Limiting alcohol use.  Practicing safe sex.  Taking low-dose aspirin every day.  Taking vitamin and mineral supplements as recommended by your health care provider. What happens during an annual well check? The services and screenings done by your health care provider during your annual well check will depend on your age, overall health,  lifestyle risk factors, and family history of disease. Counseling  Your health care provider may ask you questions about your:  Alcohol use.  Tobacco use.  Drug use.  Emotional well-being.  Home and relationship well-being.  Sexual activity.  Eating habits.  History of falls.  Memory and ability to understand (cognition).  Work and work Statistician.  Reproductive health. Screening  You may have the following tests or measurements:  Height, weight, and BMI.  Blood pressure.  Lipid and cholesterol levels. These may be checked every 5 years, or more frequently if you are over 68 years old.  Skin check.  Lung cancer screening. You may have this screening every year starting at age 44 if you have a 30-pack-year history of smoking and currently smoke or have quit within the past 15 years.  Fecal occult blood test (FOBT) of the stool. You may have this test every year starting at age 67.  Flexible sigmoidoscopy or colonoscopy. You may have a sigmoidoscopy every 5 years or a colonoscopy every 10 years starting at age 53.  Hepatitis C blood test.  Hepatitis B blood test.  Sexually transmitted disease (STD) testing.  Diabetes screening. This is done by checking your blood sugar (glucose) after you have not eaten for a while (fasting). You may have this done every 1-3 years.  Bone density scan. This is done to screen for osteoporosis. You may have this done starting at age 36.  Mammogram. This may be done every 1-2 years. Talk to your health care provider about how often you should have regular mammograms. Talk with your health care provider about your test results, treatment options, and if necessary, the need for more tests. Vaccines  Your health care provider may recommend certain vaccines, such as:  Influenza vaccine. This is recommended every year.  Tetanus, diphtheria, and acellular pertussis (Tdap, Td) vaccine. You may need a Td booster every 10 years.  Zoster  vaccine. You may need this after age 66.  Pneumococcal 13-valent conjugate (PCV13) vaccine. One dose is recommended after age 21.  Pneumococcal polysaccharide (PPSV23) vaccine. One dose is recommended after age 70. Talk to your health care provider about which screenings and vaccines you need and how often you need them. This information is not intended to replace advice given to you by your health care provider. Make sure you discuss any questions you have with your health care provider. Document Released: 09/28/2015 Document Revised: 05/21/2016 Document Reviewed: 07/03/2015 Elsevier Interactive Patient Education  2017 [employer] Prevention in the Home Falls can cause injuries. They can happen to people of all ages. There are many things you can do to make your home safe and to help prevent falls. What can I do on the outside of my home?  Regularly fix the edges of walkways and driveways and fix any cracks.  Remove anything that might make you trip as you walk through a door, such as a raised step or threshold.  Trim any bushes or trees on the path to your home.  Use bright outdoor lighting.  Clear any walking paths of anything that might make someone trip, such as rocks or tools.  Regularly check to see if handrails are loose or broken. Make sure that both sides of any steps have handrails.  Any raised decks and porches should have guardrails on the edges.  Have any leaves, snow, or ice cleared regularly.  Use sand or salt on walking paths during winter.  Clean up any spills in your garage right away. This includes oil or grease spills. What can I do in the bathroom?  Use night lights.  Install grab bars by the toilet and in the tub and shower. Do not use towel bars as grab bars.  Use non-skid mats or decals in the tub or shower.  If you need to sit down in the shower, use a plastic, non-slip stool.  Keep the floor dry. Clean up any water that spills on the  floor as soon as it happens.  Remove soap buildup in the tub or shower regularly.  Attach bath mats securely with double-sided non-slip rug tape.  Do not have throw rugs and other things on the floor that can make you trip. What can I do in the bedroom?  Use night lights.  Make sure that you have a light by your bed that is easy to reach.  Do not use any sheets or blankets that are too big for your bed. They should not hang down onto the floor.  Have a firm chair that has side arms. You can use this for support while you get dressed.  Do not have throw rugs and other things on the floor that can make you trip. What can I do in the kitchen?  Clean up any spills right away.  Avoid walking on wet floors.  Keep items that you use a lot in easy-to-reach places.  If you need to reach something above you, use a strong step stool that has a grab bar.  Keep electrical cords out of the way.  Do not use floor polish or wax that makes floors slippery. If you must use wax, use non-skid floor wax.  Do  not have throw rugs and other things on the floor that can make you trip. What can I do with my stairs?  Do not leave any items on the stairs.  Make sure that there are handrails on both sides of the stairs and use them. Fix handrails that are broken or loose. Make sure that handrails are as long as the stairways.  Check any carpeting to make sure that it is firmly attached to the stairs. Fix any carpet that is loose or worn.  Avoid having throw rugs at the top or bottom of the stairs. If you do have throw rugs, attach them to the floor with carpet tape.  Make sure that you have a light switch at the top of the stairs and the bottom of the stairs. If you do not have them, ask someone to add them for you. What else can I do to help prevent falls?  Wear shoes that:  Do not have high heels.  Have rubber bottoms.  Are comfortable and fit you well.  Are closed at the toe. Do not wear  sandals.  If you use a stepladder:  Make sure that it is fully opened. Do not climb a closed stepladder.  Make sure that both sides of the stepladder are locked into place.  Ask someone to hold it for you, if possible.  Clearly mark and make sure that you can see:  Any grab bars or handrails.  First and last steps.  Where the edge of each step is.  Use tools that help you move around (mobility aids) if they are needed. These include:  Canes.  Walkers.  Scooters.  Crutches.  Turn on the lights when you go into a dark area. Replace any light bulbs as soon as they burn out.  Set up your furniture so you have a clear path. Avoid moving your furniture around.  If any of your floors are uneven, fix them.  If there are any pets around you, be aware of where they are.  Review your medicines with your doctor. Some medicines can make you feel dizzy. This can increase your chance of falling. Ask your doctor what other things that you can do to help prevent falls. This information is not intended to replace advice given to you by your health care provider. Make sure you discuss any questions you have with your health care provider. Document Released: 06/28/2009 Document Revised: 02/07/2016 Document Reviewed: 10/06/2014 Elsevier Interactive Patient Education  2017 [employer].

## 2020-08-01 ENCOUNTER — Other Ambulatory Visit: Payer: Self-pay | Admitting: Internal Medicine

## 2020-08-17 ENCOUNTER — Other Ambulatory Visit: Payer: Self-pay | Admitting: Internal Medicine

## 2020-08-20 ENCOUNTER — Other Ambulatory Visit: Payer: Self-pay | Admitting: Internal Medicine

## 2020-08-23 NOTE — Telephone Encounter (Signed)
Shelley Wright 07/17/20 Last OV (tele) 09/23/19  Medication d/c 09/23/19

## 2020-08-25 DIAGNOSIS — Z20822 Contact with and (suspected) exposure to covid-19: Secondary | ICD-10-CM | POA: Diagnosis not present

## 2020-08-25 DIAGNOSIS — J069 Acute upper respiratory infection, unspecified: Secondary | ICD-10-CM | POA: Diagnosis not present

## 2020-09-05 DIAGNOSIS — M25552 Pain in left hip: Secondary | ICD-10-CM | POA: Diagnosis not present

## 2020-09-05 DIAGNOSIS — M7542 Impingement syndrome of left shoulder: Secondary | ICD-10-CM | POA: Diagnosis not present

## 2020-09-05 DIAGNOSIS — M7062 Trochanteric bursitis, left hip: Secondary | ICD-10-CM | POA: Diagnosis not present

## 2020-09-12 ENCOUNTER — Other Ambulatory Visit: Payer: Self-pay | Admitting: Internal Medicine

## 2020-09-13 DIAGNOSIS — Z1159 Encounter for screening for other viral diseases: Secondary | ICD-10-CM | POA: Diagnosis not present

## 2020-09-24 ENCOUNTER — Other Ambulatory Visit: Payer: Self-pay | Admitting: *Deleted

## 2020-09-24 MED ORDER — ROSUVASTATIN CALCIUM 20 MG PO TABS: 20.0000 mg | ORAL_TABLET | Freq: Every day | ORAL | 3 refills | Status: DC

## 2020-09-24 NOTE — Telephone Encounter (Signed)
Rx done. 

## 2020-09-28 ENCOUNTER — Other Ambulatory Visit: Payer: Self-pay | Admitting: Internal Medicine

## 2020-09-28 ENCOUNTER — Telehealth: Payer: Self-pay | Admitting: Internal Medicine

## 2020-09-28 DIAGNOSIS — R739 Hyperglycemia, unspecified: Secondary | ICD-10-CM

## 2020-09-28 DIAGNOSIS — E785 Hyperlipidemia, unspecified: Secondary | ICD-10-CM

## 2020-09-28 DIAGNOSIS — Z79899 Other long term (current) drug therapy: Secondary | ICD-10-CM

## 2020-09-28 DIAGNOSIS — I10 Essential (primary) hypertension: Secondary | ICD-10-CM

## 2020-09-28 NOTE — Telephone Encounter (Signed)
Pt needs a refill on medication lisinopril (ZESTRIL) 10 MG tablet; she has an appointment on 10/03/2020  for a medication refill visit. She would like to know if she can have a temporary refill until she comes into the office. she also wants blood work done before her appt . Pt would like a call

## 2020-10-01 NOTE — Telephone Encounter (Signed)
Last video visit-09/23/2019 Last refill-10/21/2019--90 tabs with 3 refills  Next office visit--10/03/2020

## 2020-10-02 MED ORDER — LISINOPRIL 10 MG PO TABS: 10.0000 mg | ORAL_TABLET | Freq: Every day | ORAL | 0 refills | Status: DC

## 2020-10-02 NOTE — Telephone Encounter (Signed)
Patient has an appointment scheduled for 10/03/2020.  FYI

## 2020-10-02 NOTE — Progress Notes (Signed)
Virtual Visit via Video Note  I connected with@ on 10/03/20 at  3:00 PM EST by a video enabled telemedicine application and verified that I am speaking with the correct person using two identifiers. Location patient: home Location provider:work  office Persons participating in the virtual visit: patient, provider  [place] national recommendations  regarding COVID 19 pandemic   video visit is advised over in office visit for this patient.  Patient aware  of the limitations of evaluation and management by telemedicine and  availability of in person appointments. and agreed to proceed.   HPI: Shelley Wright presents for video visit  For med  Fu  delayed cause of  covid etc  BP: lisinopril out  Has lab appt  End  of month HLD :crestor no se . Med: Lexapro for years taking  10 mg per da y for years  Triple vaxed  Battling recurrant  Bursitis  And now  Shoulder  Prednisone and injections  ROS: See pertinent positives and negatives per HPI.  Past Medical History:  Diagnosis Date  . Anxiety    no per pt  . Arthritis   . GERD (gastroesophageal reflux disease)   . Heart murmur    'YEARS AGO"  . History of smoking   . History of ulcerative colitis 2005   pancolitis   . Hyperlipidemia   . Hypertension   . Normal nuclear stress test 2008   myoview     Past Surgical History:  Procedure Laterality Date  . BREAST BIOPSY Left 1986   biopsy x 2   . COLONOSCOPY    . KNEE SURGERY Left october   x 2    Family History  Problem Relation Age of Onset  . Pancreatic cancer Father        deceased  . Hypertension Father   . Breast cancer Mother 14  . Angina Mother   . Hypertension Mother   . Colon cancer Neg Hx   . Colon polyps Neg Hx   . Rectal cancer Neg Hx   . Stomach cancer Neg Hx     Social History   Tobacco Use  . Smoking status: Former Smoker    Quit date: 07/04/2014    Years since quitting: 6.2  . Smokeless tobacco: Never Used  Vaping Use  . Vaping Use: Never used   Substance Use Topics  . Alcohol use: No    Alcohol/week: 0.0 standard drinks  . Drug use: No      Current Outpatient Medications:  .  escitalopram (LEXAPRO) 10 MG tablet, Take 1 tablet (10 mg total) by mouth daily., Disp: 90 tablet, Rfl: 1 .  lisinopril (ZESTRIL) 10 MG tablet, Take 1 tablet (10 mg total) by mouth daily., Disp: 30 tablet, Rfl: 0 .  rosuvastatin (CRESTOR) 20 MG tablet, Take 1 tablet (20 mg total) by mouth daily., Disp: 90 tablet, Rfl: 0  EXAM: BP Readings from Last 3 Encounters:  12/30/19 125/70  07/04/19 122/68  11/05/18 104/64    VITALS per patient if applicable:  GENERAL: alert, oriented, appears well and in no acute distress  HEENT: atraumatic, conjunttiva clear, no obvious abnormalities on inspection of external nose and ears  NECK: normal movements of the head and neck  LUNGS: on inspection no signs of respiratory distress, breathing rate appears normal, no obvious gross SOB, gasping or wheezing  CV: no obvious cyanosis  MS: moves all visible extremities without noticeable abnormality  PSYCH/NEURO: pleasant and cooperative, no obvious depression or anxiety, speech and  thought processing grossly intact Lab Results  Component Value Date   WBC 10.7 (H) 07/04/2019   HGB 14.4 07/04/2019   HCT 43.8 07/04/2019   PLT 268.0 07/04/2019   GLUCOSE 81 07/04/2019   CHOL 256 (H) 07/04/2019   TRIG 93.0 07/04/2019   HDL 65.50 07/04/2019   LDLDIRECT 138.5 10/17/2013   LDLCALC 172 (H) 07/04/2019   ALT 20 07/04/2019   AST 25 07/04/2019   NA 136 07/04/2019   K 4.2 07/04/2019   CL 97 07/04/2019   CREATININE 0.79 07/04/2019   BUN 21 07/04/2019   CO2 29 07/04/2019   TSH 2.07 07/04/2019   HGBA1C 5.9 07/04/2019    ASSESSMENT AND PLAN:  Discussed the following assessment and plan:    ICD-10-CM   1. Essential hypertension  I10   2. Hyperlipidemia, unspecified hyperlipidemia type  E78.5   3. Medication management  Z79.899   4. History of ulcerative colitis   Z87.19     Counseled.   Expectant management and discussion of plan and treatment with opportunity to ask questions and all were answered. The patient agreed with the plan and demonstrated an understanding of the instructions.   Advised to call back or seek an in-person evaluation if worsening  or having  further concerns . Return in about 6 months (around 04/02/2021) for summer in person, lab this month .  Shanon Ace, MD

## 2020-10-02 NOTE — Telephone Encounter (Signed)
Sent in electronically . Lab orders placed  Arrange her to get lab appt

## 2020-10-03 ENCOUNTER — Telehealth (INDEPENDENT_AMBULATORY_CARE_PROVIDER_SITE_OTHER): Payer: Medicare HMO | Admitting: Internal Medicine

## 2020-10-03 ENCOUNTER — Encounter: Payer: Self-pay | Admitting: Internal Medicine

## 2020-10-03 DIAGNOSIS — Z0000 Encounter for general adult medical examination without abnormal findings: Secondary | ICD-10-CM

## 2020-10-03 DIAGNOSIS — Z8719 Personal history of other diseases of the digestive system: Secondary | ICD-10-CM

## 2020-10-03 DIAGNOSIS — Z79899 Other long term (current) drug therapy: Secondary | ICD-10-CM

## 2020-10-03 DIAGNOSIS — I10 Essential (primary) hypertension: Secondary | ICD-10-CM

## 2020-10-03 DIAGNOSIS — E785 Hyperlipidemia, unspecified: Secondary | ICD-10-CM

## 2020-10-03 MED ORDER — ROSUVASTATIN CALCIUM 20 MG PO TABS: 20.0000 mg | ORAL_TABLET | Freq: Every day | ORAL | 0 refills | Status: DC

## 2020-10-03 MED ORDER — ESCITALOPRAM OXALATE 10 MG PO TABS: 10.0000 mg | ORAL_TABLET | Freq: Every day | ORAL | 1 refills | Status: DC

## 2020-10-03 MED ORDER — LISINOPRIL 10 MG PO TABS: 10.0000 mg | ORAL_TABLET | Freq: Every day | ORAL | 0 refills | Status: DC

## 2020-10-12 ENCOUNTER — Other Ambulatory Visit (INDEPENDENT_AMBULATORY_CARE_PROVIDER_SITE_OTHER): Payer: Medicare HMO

## 2020-10-12 ENCOUNTER — Other Ambulatory Visit: Payer: Self-pay

## 2020-10-12 DIAGNOSIS — E785 Hyperlipidemia, unspecified: Secondary | ICD-10-CM | POA: Diagnosis not present

## 2020-10-12 DIAGNOSIS — R739 Hyperglycemia, unspecified: Secondary | ICD-10-CM

## 2020-10-12 DIAGNOSIS — Z79899 Other long term (current) drug therapy: Secondary | ICD-10-CM | POA: Diagnosis not present

## 2020-10-12 DIAGNOSIS — I10 Essential (primary) hypertension: Secondary | ICD-10-CM

## 2020-10-12 LAB — CBC WITH DIFFERENTIAL/PLATELET
Basophils Absolute: 0.1 10*3/uL (ref 0.0–0.1)
Basophils Relative: 0.8 % (ref 0.0–3.0)
Eosinophils Absolute: 0.2 10*3/uL (ref 0.0–0.7)
Eosinophils Relative: 2.4 % (ref 0.0–5.0)
HCT: 40 % (ref 36.0–46.0)
Hemoglobin: 13.3 g/dL (ref 12.0–15.0)
Lymphocytes Relative: 26.6 % (ref 12.0–46.0)
Lymphs Abs: 1.8 10*3/uL (ref 0.7–4.0)
MCHC: 33.2 g/dL (ref 30.0–36.0)
MCV: 95 fl (ref 78.0–100.0)
Monocytes Absolute: 0.5 10*3/uL (ref 0.1–1.0)
Monocytes Relative: 7.5 % (ref 3.0–12.0)
Neutro Abs: 4.3 10*3/uL (ref 1.4–7.7)
Neutrophils Relative %: 62.7 % (ref 43.0–77.0)
Platelets: 238 10*3/uL (ref 150.0–400.0)
RBC: 4.21 Mil/uL (ref 3.87–5.11)
RDW: 13.9 % (ref 11.5–15.5)
WBC: 6.9 10*3/uL (ref 4.0–10.5)

## 2020-10-12 LAB — BASIC METABOLIC PANEL
BUN: 24 mg/dL — ABNORMAL HIGH (ref 6–23)
CO2: 31 mEq/L (ref 19–32)
Calcium: 9.3 mg/dL (ref 8.4–10.5)
Chloride: 103 mEq/L (ref 96–112)
Creatinine, Ser: 0.73 mg/dL (ref 0.40–1.20)
GFR: 81.81 mL/min (ref >60.00)
Glucose, Bld: 92 mg/dL (ref 70–99)
Potassium: 4.2 mEq/L (ref 3.5–5.1)
Sodium: 139 mEq/L (ref 135–145)

## 2020-10-12 LAB — LIPID PANEL
Cholesterol: 226 mg/dL — ABNORMAL HIGH (ref 0–200)
HDL: 72.2 mg/dL (ref >39.00)
LDL Cholesterol: 138 mg/dL — ABNORMAL HIGH (ref 0–99)
NonHDL: 153.42
Total CHOL/HDL Ratio: 3
Triglycerides: 77 mg/dL (ref 0.0–149.0)
VLDL: 15.4 mg/dL (ref 0.0–40.0)

## 2020-10-12 LAB — HEPATIC FUNCTION PANEL
ALT: 16 U/L (ref 0–35)
AST: 18 U/L (ref 0–37)
Albumin: 3.9 g/dL (ref 3.5–5.2)
Alkaline Phosphatase: 39 U/L (ref 39–117)
Bilirubin, Direct: 0.1 mg/dL (ref 0.0–0.3)
Total Bilirubin: 0.6 mg/dL (ref 0.2–1.2)
Total Protein: 6.4 g/dL (ref 6.0–8.3)

## 2020-10-12 LAB — HEMOGLOBIN A1C: Hgb A1c MFr Bld: 5.9 % (ref 4.6–6.5)

## 2020-10-12 LAB — TSH: TSH: 1.8 u[IU]/mL (ref 0.35–4.50)

## 2020-10-14 NOTE — Progress Notes (Signed)
Results  normal or stable   cholesterol is much better although ldl is still above 100 as in past . No diabetes

## 2020-10-25 ENCOUNTER — Other Ambulatory Visit: Payer: Self-pay | Admitting: *Deleted

## 2020-10-25 MED ORDER — LISINOPRIL 10 MG PO TABS: 10.0000 mg | ORAL_TABLET | Freq: Every day | ORAL | 1 refills | Status: DC

## 2020-10-25 NOTE — Telephone Encounter (Signed)
Rx done. 

## 2020-11-02 DIAGNOSIS — M5459 Other low back pain: Secondary | ICD-10-CM | POA: Diagnosis not present

## 2020-11-02 DIAGNOSIS — M7062 Trochanteric bursitis, left hip: Secondary | ICD-10-CM | POA: Diagnosis not present

## 2020-11-16 DIAGNOSIS — M5416 Radiculopathy, lumbar region: Secondary | ICD-10-CM | POA: Diagnosis not present

## 2020-11-23 DIAGNOSIS — M5416 Radiculopathy, lumbar region: Secondary | ICD-10-CM | POA: Diagnosis not present

## 2020-11-26 DIAGNOSIS — M5416 Radiculopathy, lumbar region: Secondary | ICD-10-CM | POA: Diagnosis not present

## 2020-11-30 DIAGNOSIS — M5416 Radiculopathy, lumbar region: Secondary | ICD-10-CM | POA: Diagnosis not present

## 2020-12-03 DIAGNOSIS — M5459 Other low back pain: Secondary | ICD-10-CM | POA: Diagnosis not present

## 2020-12-04 DIAGNOSIS — H25812 Combined forms of age-related cataract, left eye: Secondary | ICD-10-CM | POA: Diagnosis not present

## 2020-12-04 DIAGNOSIS — H2512 Age-related nuclear cataract, left eye: Secondary | ICD-10-CM | POA: Diagnosis not present

## 2020-12-10 DIAGNOSIS — M5416 Radiculopathy, lumbar region: Secondary | ICD-10-CM | POA: Diagnosis not present

## 2020-12-12 DIAGNOSIS — M5416 Radiculopathy, lumbar region: Secondary | ICD-10-CM | POA: Diagnosis not present

## 2021-02-27 ENCOUNTER — Other Ambulatory Visit: Payer: Self-pay | Admitting: Internal Medicine

## 2021-03-12 DIAGNOSIS — H25811 Combined forms of age-related cataract, right eye: Secondary | ICD-10-CM | POA: Diagnosis not present

## 2021-03-12 DIAGNOSIS — H2511 Age-related nuclear cataract, right eye: Secondary | ICD-10-CM | POA: Diagnosis not present

## 2021-05-06 ENCOUNTER — Other Ambulatory Visit: Payer: Self-pay | Admitting: Internal Medicine

## 2021-05-08 DIAGNOSIS — M1712 Unilateral primary osteoarthritis, left knee: Secondary | ICD-10-CM | POA: Diagnosis not present

## 2021-05-08 DIAGNOSIS — M7062 Trochanteric bursitis, left hip: Secondary | ICD-10-CM | POA: Diagnosis not present

## 2021-05-08 DIAGNOSIS — M7542 Impingement syndrome of left shoulder: Secondary | ICD-10-CM | POA: Diagnosis not present

## 2021-06-10 ENCOUNTER — Telehealth: Payer: Self-pay | Admitting: Internal Medicine

## 2021-06-10 NOTE — Telephone Encounter (Signed)
Patient asked if it was possible to have orders put in for labs today and then be scheduled for a virtual appointment on Thursday. Offered patient office appointment for 10/17 at 9:30 but patient does not want to wait that long and would like to be seen this week. Patient states she is having some dizziness on and off that brings nausea occasionally.    Good callback number is [phone]

## 2021-06-10 NOTE — Telephone Encounter (Signed)
Patient called triage and stated that she is needing an appointment. Called patient to get her scheduled. No answer, left voicemail message.

## 2021-06-11 NOTE — Telephone Encounter (Signed)
Pt has been added to schedule for 09/29

## 2021-06-11 NOTE — Telephone Encounter (Signed)
needs a visit first and then order labs.  Labs from [place] were ok  I will not be able to see her in person however can add on  10 30 slot virtual . Thurs Or see another provider  if that wont work

## 2021-06-13 ENCOUNTER — Encounter: Payer: Self-pay | Admitting: Neurology

## 2021-06-13 ENCOUNTER — Telehealth (INDEPENDENT_AMBULATORY_CARE_PROVIDER_SITE_OTHER): Payer: Medicare HMO | Admitting: Internal Medicine

## 2021-06-13 ENCOUNTER — Encounter: Payer: Self-pay | Admitting: Internal Medicine

## 2021-06-13 VITALS — Ht 62.0 in | Wt 160.0 lb

## 2021-06-13 DIAGNOSIS — R739 Hyperglycemia, unspecified: Secondary | ICD-10-CM

## 2021-06-13 DIAGNOSIS — Z79899 Other long term (current) drug therapy: Secondary | ICD-10-CM | POA: Diagnosis not present

## 2021-06-13 DIAGNOSIS — E785 Hyperlipidemia, unspecified: Secondary | ICD-10-CM

## 2021-06-13 DIAGNOSIS — R42 Dizziness and giddiness: Secondary | ICD-10-CM

## 2021-06-13 DIAGNOSIS — R6889 Other general symptoms and signs: Secondary | ICD-10-CM

## 2021-06-13 DIAGNOSIS — I10 Essential (primary) hypertension: Secondary | ICD-10-CM | POA: Diagnosis not present

## 2021-06-13 DIAGNOSIS — R52 Pain, unspecified: Secondary | ICD-10-CM

## 2021-06-13 DIAGNOSIS — R531 Weakness: Secondary | ICD-10-CM

## 2021-06-13 NOTE — Progress Notes (Signed)
Virtual Visit via Video Note  I connected with  Shelley Wright on 06/13/21 at 10:30 AM EDT by a video enabled telemedicine application and verified that I am speaking with the correct person using two identifiers. Location patient: home Location provider: home office Persons participating in the virtual visit: patient, provider  [place] national recommendations  regarding COVID 19 pandemic   video visit is advised over in office visit for this patient.  Patient aware  of the limitations of evaluation and management by telemedicine and  availability of in person appointments. and agreed to proceed.   HPI: Shelley Wright presents for video visit because of a recurrence of dizziness that we previously thought was vertigo when she had it in the past. No recent in person visit with me .  States that she woke up 1 day and was spinning headed and is continuing over a week without associated fever respiratory infection nausea vomiting ,although 1 day did have a headache that woke her up sharp on the right side. No change in her hearing or vision.  States that she tends to hurt all over has seen Dr. Alvan Dame for hip injections with cortisone with some help. No falling but feels balance is off even before the vertigo. Wonders if she could have MS or some other cause . States she "feels weak all over hard to get up from the floor" uncertain if related to the pain in these areas versus weakness.  Bp has been good   120/low 80s usually no racing heart or palpitations. On Lexapro has been on it for a while   stable no anxiety or depression symptoms at this time of significance. Negative family history of autoimmune disease Dr. Alvan Dame had injections in the hip for bursitis each side  having shoulder issues also not felt to be surgical problem.  ROS: See pertinent positives and negatives per HPI.  Past Medical History:  Diagnosis Date   Anxiety    no per pt   Arthritis    GERD (gastroesophageal reflux  disease)    Heart murmur    'YEARS AGO"   History of smoking    History of ulcerative colitis 2005   pancolitis    Hyperlipidemia    Hypertension    Normal nuclear stress test 2008   myoview     Past Surgical History:  Procedure Laterality Date   BREAST BIOPSY Left 1986   biopsy x 2    COLONOSCOPY     KNEE SURGERY Left october   x 2    Family History  Problem Relation Age of Onset   Pancreatic cancer Father        deceased   Hypertension Father    Breast cancer Mother 55   Angina Mother    Hypertension Mother    Colon cancer Neg Hx    Colon polyps Neg Hx    Rectal cancer Neg Hx    Stomach cancer Neg Hx     Social History   Tobacco Use   Smoking status: Former    Types: Cigarettes    Quit date: 07/04/2014    Years since quitting: 6.9   Smokeless tobacco: Never  Vaping Use   Vaping Use: Never used  Substance Use Topics   Alcohol use: No    Alcohol/week: 0.0 standard drinks   Drug use: No      Current Outpatient Medications:    escitalopram (LEXAPRO) 10 MG tablet, TAKE 1 TABLET EVERY DAY, Disp: 90 tablet, Rfl:  0   lisinopril (ZESTRIL) 10 MG tablet, TAKE 1 TABLET EVERY DAY, Disp: 90 tablet, Rfl: 1   rosuvastatin (CRESTOR) 20 MG tablet, Take 1 tablet (20 mg total) by mouth daily., Disp: 90 tablet, Rfl: 0  EXAM: BP Readings from Last 3 Encounters:  12/30/19 125/70  07/04/19 122/68  11/05/18 104/64    VITALS per patient if applicable:  GENERAL: alert, oriented, appears well and in no acute distress  HEENT: atraumatic, conjunttiva clear, no obvious abnormalities on inspection of external nose and ears  NECK: normal movements of the head and neck  LUNGS: on inspection no signs of respiratory distress, breathing rate appears normal, no obvious gross SOB, gasping or wheezing  CV: no obvious cyanosis  MS: moves all visible extremities without noticeable abnormality  PSYCH/NEURO: pleasant and cooperative, no obvious depression or anxiety, speech and  thought processing grossly intact Lab Results  Component Value Date   WBC 6.9 10/12/2020   HGB 13.3 10/12/2020   HCT 40.0 10/12/2020   PLT 238.0 10/12/2020   GLUCOSE 92 10/12/2020   CHOL 226 (H) 10/12/2020   TRIG 77.0 10/12/2020   HDL 72.20 10/12/2020   LDLDIRECT 138.5 10/17/2013   LDLCALC 138 (H) 10/12/2020   ALT 16 10/12/2020   AST 18 10/12/2020   NA 139 10/12/2020   K 4.2 10/12/2020   CL 103 10/12/2020   CREATININE 0.73 10/12/2020   BUN 24 (H) 10/12/2020   CO2 31 10/12/2020   TSH 1.80 10/12/2020   HGBA1C 5.9 10/12/2020    ASSESSMENT AND PLAN:  Discussed the following assessment and plan:    ICD-10-CM   1. Dizziness  R42 TSH    T4, free    Basic metabolic panel    CBC with Differential/Platelet    Vitamin B12    C-reactive Protein    CK    Sedimentation Rate    Rheumatoid Factor    ANA    Cyclic citrul peptide antibody, IgG    Hepatic function panel    Hemoglobin A1c    Ambulatory referral to Neurology    2. Complaints of total body pain  R52 TSH    T4, free    Basic metabolic panel    CBC with Differential/Platelet    Vitamin B12    C-reactive Protein    CK    Sedimentation Rate    Rheumatoid Factor    ANA    Cyclic citrul peptide antibody, IgG    Hepatic function panel   may be hips and shoulder arthritis    3. Medication management  Z79.899 TSH    T4, free    Basic metabolic panel    CBC with Differential/Platelet    Vitamin B12    C-reactive Protein    CK    Sedimentation Rate    Rheumatoid Factor    ANA    Cyclic citrul peptide antibody, IgG    Hepatic function panel    4. Decreased exercise tolerance  R68.89 TSH    T4, free    Basic metabolic panel    CBC with Differential/Platelet    Vitamin B12    C-reactive Protein    CK    Sedimentation Rate    Rheumatoid Factor    ANA    Cyclic citrul peptide antibody, IgG    Hepatic function panel    Hemoglobin A1c    DG Chest 2 View   hips  MS  and respiratory gets winded     5.  Weakness w  imbalance sx   R53.1 TSH    T4, free    Basic metabolic panel    CBC with Differential/Platelet    Vitamin B12    C-reactive Protein    CK    Sedimentation Rate    Rheumatoid Factor    ANA    Cyclic citrul peptide antibody, IgG    Hepatic function panel    Ambulatory referral to Neurology   per video visit    6. Hyperlipidemia, unspecified hyperlipidemia type  E78.5 TSH    T4, free    Basic metabolic panel    CBC with Differential/Platelet    Vitamin B12    C-reactive Protein    CK    Sedimentation Rate    Rheumatoid Factor    ANA    Cyclic citrul peptide antibody, IgG    Hepatic function panel    7. Essential hypertension  I10 TSH    T4, free    Basic metabolic panel    CBC with Differential/Platelet    Vitamin B12    C-reactive Protein    CK    Sedimentation Rate    Rheumatoid Factor    ANA    Cyclic citrul peptide antibody, IgG    Hepatic function panel    8. Hyperglycemia  R73.9 TSH    T4, free    Basic metabolic panel    CBC with Differential/Platelet    Vitamin B12    C-reactive Protein    CK    Sedimentation Rate    Rheumatoid Factor    ANA    Cyclic citrul peptide antibody, IgG    Hepatic function panel    Hemoglobin A1c     Number of symptoms difficult to tell if separate entities versus encompassing diagnosis.  Dizziness acts like vertigo but nonspecific she is on an SSRI but has been on it a while recheck her chemistry panel.  states that she is weak all over unclear if this is an orthopedic or neurologic finding.  He wonders if she could have MS. Her exercise tolerance changes also difficult to tell if cardiovascular or pulmonary plus musculoskeletal as a cause. ?if  crestor could cause a sx ? To get things moving plan laboratory testing and chest x-ray screening. Neurology referral  If  progressive sx need in person eval  sooner Also plan follow-up in person visit in office. Anyway  In future may want to decrease the Lexapro to 5  mg since things are stable at this time.  Counseled.   Expectant management and discussion of plan and treatment with opportunity to ask questions and all were answered. The patient agreed with the plan and demonstrated an understanding of the instructions.  review  visit plan  45 minutes  Advised to call back or seek an in-person evaluation if worsening  or having  further concerns . Return for lab and x ray     in  person visit in fu   neuro referral.    Shanon Ace, MD

## 2021-06-17 DIAGNOSIS — Z1231 Encounter for screening mammogram for malignant neoplasm of breast: Secondary | ICD-10-CM | POA: Diagnosis not present

## 2021-06-17 LAB — HM MAMMOGRAPHY

## 2021-06-24 ENCOUNTER — Encounter: Payer: Self-pay | Admitting: Internal Medicine

## 2021-06-24 ENCOUNTER — Other Ambulatory Visit: Payer: Self-pay

## 2021-06-24 ENCOUNTER — Other Ambulatory Visit (INDEPENDENT_AMBULATORY_CARE_PROVIDER_SITE_OTHER): Payer: Medicare HMO

## 2021-06-24 ENCOUNTER — Ambulatory Visit (INDEPENDENT_AMBULATORY_CARE_PROVIDER_SITE_OTHER): Payer: Medicare HMO

## 2021-06-24 DIAGNOSIS — Z79899 Other long term (current) drug therapy: Secondary | ICD-10-CM

## 2021-06-24 DIAGNOSIS — I10 Essential (primary) hypertension: Secondary | ICD-10-CM

## 2021-06-24 DIAGNOSIS — R52 Pain, unspecified: Secondary | ICD-10-CM | POA: Diagnosis not present

## 2021-06-24 DIAGNOSIS — R531 Weakness: Secondary | ICD-10-CM | POA: Diagnosis not present

## 2021-06-24 DIAGNOSIS — R6889 Other general symptoms and signs: Secondary | ICD-10-CM

## 2021-06-24 DIAGNOSIS — R0600 Dyspnea, unspecified: Secondary | ICD-10-CM | POA: Diagnosis not present

## 2021-06-24 DIAGNOSIS — R739 Hyperglycemia, unspecified: Secondary | ICD-10-CM

## 2021-06-24 DIAGNOSIS — E785 Hyperlipidemia, unspecified: Secondary | ICD-10-CM | POA: Diagnosis not present

## 2021-06-24 DIAGNOSIS — R42 Dizziness and giddiness: Secondary | ICD-10-CM | POA: Diagnosis not present

## 2021-06-24 LAB — HEPATIC FUNCTION PANEL
ALT: 19 U/L (ref 0–35)
AST: 22 U/L (ref 0–37)
Albumin: 4.2 g/dL (ref 3.5–5.2)
Alkaline Phosphatase: 35 U/L — ABNORMAL LOW (ref 39–117)
Bilirubin, Direct: 0.1 mg/dL (ref 0.0–0.3)
Total Bilirubin: 0.7 mg/dL (ref 0.2–1.2)
Total Protein: 6.5 g/dL (ref 6.0–8.3)

## 2021-06-24 LAB — BASIC METABOLIC PANEL
BUN: 31 mg/dL — ABNORMAL HIGH (ref 6–23)
CO2: 29 mEq/L (ref 19–32)
Calcium: 9.2 mg/dL (ref 8.4–10.5)
Chloride: 104 mEq/L (ref 96–112)
Creatinine, Ser: 1.07 mg/dL (ref 0.40–1.20)
GFR: 51.45 mL/min — ABNORMAL LOW (ref >60.00)
Glucose, Bld: 75 mg/dL (ref 70–99)
Potassium: 4.3 mEq/L (ref 3.5–5.1)
Sodium: 140 mEq/L (ref 135–145)

## 2021-06-24 LAB — HEMOGLOBIN A1C: Hgb A1c MFr Bld: 6 % (ref 4.6–6.5)

## 2021-06-24 LAB — CBC WITH DIFFERENTIAL/PLATELET
Basophils Absolute: 0.1 10*3/uL (ref 0.0–0.1)
Basophils Relative: 0.6 % (ref 0.0–3.0)
Eosinophils Absolute: 0.3 10*3/uL (ref 0.0–0.7)
Eosinophils Relative: 2.6 % (ref 0.0–5.0)
HCT: 37.8 % (ref 36.0–46.0)
Hemoglobin: 12.4 g/dL (ref 12.0–15.0)
Lymphocytes Relative: 23.6 % (ref 12.0–46.0)
Lymphs Abs: 2.4 10*3/uL (ref 0.7–4.0)
MCHC: 32.8 g/dL (ref 30.0–36.0)
MCV: 95.2 fl (ref 78.0–100.0)
Monocytes Absolute: 0.7 10*3/uL (ref 0.1–1.0)
Monocytes Relative: 6.5 % (ref 3.0–12.0)
Neutro Abs: 6.8 10*3/uL (ref 1.4–7.7)
Neutrophils Relative %: 66.7 % (ref 43.0–77.0)
Platelets: 252 10*3/uL (ref 150.0–400.0)
RBC: 3.97 Mil/uL (ref 3.87–5.11)
RDW: 14.5 % (ref 11.5–15.5)
WBC: 10.1 10*3/uL (ref 4.0–10.5)

## 2021-06-24 LAB — VITAMIN B12: Vitamin B-12: 473 pg/mL (ref 211–911)

## 2021-06-24 LAB — T4, FREE: Free T4: 0.94 ng/dL (ref 0.60–1.60)

## 2021-06-24 LAB — CK: Total CK: 100 U/L (ref 7–177)

## 2021-06-24 LAB — TSH: TSH: 3.23 u[IU]/mL (ref 0.35–5.50)

## 2021-06-24 LAB — SEDIMENTATION RATE: Sed Rate: 15 mm/hr (ref 0–30)

## 2021-06-24 LAB — C-REACTIVE PROTEIN: CRP: <1 mg/dL (ref 0.5–20.0)

## 2021-06-26 LAB — ANTI-NUCLEAR AB-TITER (ANA TITER)
ANA TITER: 1:80 {titer} — ABNORMAL HIGH
ANA Titer 1: 1:80 {titer} — ABNORMAL HIGH

## 2021-06-26 LAB — CYCLIC CITRUL PEPTIDE ANTIBODY, IGG: Cyclic Citrullin Peptide Ab: <16 UNITS

## 2021-06-26 LAB — ANA: Anti Nuclear Antibody (ANA): POSITIVE — AB

## 2021-06-26 LAB — RHEUMATOID FACTOR: Rheumatoid fact SerPl-aCnc: <14 IU/mL (ref <14)

## 2021-06-27 ENCOUNTER — Telehealth: Payer: Medicare HMO | Admitting: Internal Medicine

## 2021-07-03 ENCOUNTER — Telehealth: Payer: Self-pay | Admitting: Internal Medicine

## 2021-07-03 NOTE — Telephone Encounter (Signed)
Patient called to get lab results from last week.     Good callback number is [phone]     Please Advise

## 2021-07-05 NOTE — Telephone Encounter (Signed)
See lab  results notes   About fu lung tests or pulmonary referral  option  and make sure neurology referral  is in the works .

## 2021-07-05 NOTE — Progress Notes (Signed)
No alarming findings   radiology says thickening of  bronchial tubes a non diagnostic finding . And you dont' have  sx of  bronchitis.   We can order   lung function tests if you agree  to check lung functions  consdier  ct scan of lungs   alternatively have  pulmonary consult   . Place orders for PFTs  either way .

## 2021-07-05 NOTE — Progress Notes (Signed)
Labs   mostly normal  non diagnostic  one autoimmune marker is positive but borderline and may not be   significant .   Please make sure  neurology referral  is  proceeding as planned .

## 2021-07-08 NOTE — Progress Notes (Signed)
The purpose of the lung function test would be to see how well your lungs are functioning since you have some history of exercise intolerance, shortness of breath . .  If your lung function are normal that is reassuring as to the cause of your symptoms.  [email or web address] Alternatively we can refer to pulmonary specialist to evaluated your lungs and breathing  Let us know how you want to proceed .

## 2021-07-08 NOTE — Progress Notes (Signed)
The marker is an ANA and its borderline elevated.  Many people in the population have this marker and do not have autoimmune disease.  If it were at a high level I would be more concerned. However if we have continued problem we can always get a rheumatology consult.  To get their opinion

## 2021-07-08 NOTE — Telephone Encounter (Signed)
Neurology referral was sent to [facility] on 09/29 and will contact the pt.

## 2021-07-19 ENCOUNTER — Other Ambulatory Visit: Payer: Self-pay

## 2021-07-19 ENCOUNTER — Ambulatory Visit: Payer: Medicare HMO | Admitting: Neurology

## 2021-07-19 ENCOUNTER — Encounter: Payer: Self-pay | Admitting: Neurology

## 2021-07-19 VITALS — BP 147/82 | HR 74 | Resp 18 | Ht 63.0 in | Wt 160.0 lb

## 2021-07-19 DIAGNOSIS — H8110 Benign paroxysmal vertigo, unspecified ear: Secondary | ICD-10-CM

## 2021-07-19 DIAGNOSIS — R413 Other amnesia: Secondary | ICD-10-CM

## 2021-07-19 NOTE — Progress Notes (Signed)
[facility] Clinic Note - Initial Visit   Date: 07/19/21  [name] MRN: [id number] DOB: 1945/10/05   Dear Dr. Regis Bill:  Thank you for your kind referral of Shelley Wright for consultation of dizziness. Although her history is well known to you, please allow [place] to reiterate it for the purpose of our medical record. The patient was accompanied to the clinic by self.    History of Present Illness: Shelley Wright is a 75 y.o. left-handed female with hypertension and hyperlipidemia presenting for evaluation of dizziness. Starting around September 2022, she began having positional spinning sensation associated with headaches. She recall having problems with balance. She complains of chronic ear discomfort and itching.  She has not seen ENT. The severity of her dizziness has improved, but she has noticed that abrupt head movements can trigger her dizziness.  She no longer has headaches.    She also complains of memory loss such as forgetting works and tasks.  She recently retired from [employer] because of not being able to keep up with her work.  She manages her finances, household chores, and drives without difficulty. She stay active doing crosswords.  She is restricted with her physical activity because of shoulder and hip bursitis.     Out-side paper records, electronic medical record, and images have been reviewed where available and summarized as:  Lab Results  Component Value Date   HGBA1C 6.0 06/24/2021   Lab Results  Component Value Date   [id number] 567 06/24/2021   Lab Results  Component Value Date   TSH 3.23 06/24/2021   Lab Results  Component Value Date   ESRSEDRATE 15 06/24/2021    Past Medical History:  Diagnosis Date   Anxiety    no per pt   Arthritis    GERD (gastroesophageal reflux disease)    Heart murmur    'YEARS AGO"   History of smoking    History of ulcerative colitis 2005   pancolitis    Hyperlipidemia     Hypertension    Normal nuclear stress test 2008   myoview     Past Surgical History:  Procedure Laterality Date   BREAST BIOPSY Left 1986   biopsy x 2    COLONOSCOPY     KNEE SURGERY Left october   x 2     Medications:  Outpatient Encounter Medications as of 07/19/2021  Medication Sig   escitalopram (LEXAPRO) 10 MG tablet TAKE 1 TABLET EVERY DAY   lisinopril (ZESTRIL) 10 MG tablet TAKE 1 TABLET EVERY DAY   rosuvastatin (CRESTOR) 20 MG tablet Take 1 tablet (20 mg total) by mouth daily.   No facility-administered encounter medications on file as of 07/19/2021.    Allergies: No Known Allergies  Family History: Family History  Problem Relation Age of Onset   Pancreatic cancer Father        deceased   Hypertension Father    Breast cancer Mother 10   Angina Mother    Hypertension Mother    Colon cancer Neg Hx    Colon polyps Neg Hx    Rectal cancer Neg Hx    Stomach cancer Neg Hx     Social History: Social History   Tobacco Use   Smoking status: Former    Types: Cigarettes    Quit date: 07/04/2014    Years since quitting: 7.0   Smokeless tobacco: Never  Vaping Use   Vaping Use: Never used  Substance Use Topics  Alcohol use: No    Alcohol/week: 0.0 standard drinks   Drug use: No   Social History   Social History Narrative   Hh  Of 2    Pet dog    Off tobacco.   [employer].     Teacher runs school.   8 - 3    G4 P4    Husband had cva and blocked carotids    Left handed   Drinks caffeine   Two story home    Vital Signs:  BP (!) 147/82   Pulse 74   Resp 18   Ht 5' 3"  (1.6 m)   Wt 160 lb (72.6 kg)   SpO2 98%   BMI 28.34 kg/m   Neurological Exam: MENTAL STATUS including orientation to time, place, person, recent and remote memory, attention span and concentration, language, and fund of knowledge is normal.  Speech is not dysarthric.  CRANIAL NERVES: II:  No visual field defects.    III-IV-VI: Pupils equal round and reactive to light.   Normal conjugate, extra-ocular eye movements in all directions of gaze.  No nystagmus.  No ptosis.   V:  Normal facial sensation.    VII:  Normal facial symmetry and movements.   VIII:  Normal hearing and vestibular function.   IX-X:  Normal palatal movement.   XI:  Normal shoulder shrug and head rotation.   XII:  Normal tongue strength and range of motion, no deviation or fasciculation.  MOTOR:  No atrophy, fasciculations or abnormal movements.  No pronator drift.   Upper Extremity:  Right  Left  Deltoid  5/5   5/5   Biceps  5/5   5/5   Triceps  5/5   5/5   Infraspinatus 5/5  5/5  Medial pectoralis 5/5  5/5  Wrist extensors  5/5   5/5   Wrist flexors  5/5   5/5   Finger extensors  5/5   5/5   Finger flexors  5/5   5/5   Dorsal interossei  5/5   5/5   Abductor pollicis  5/5   5/5   Tone (Ashworth scale)  0  0   Lower Extremity:  Right  Left  Hip flexors  5/5   5/5   Hip extensors  5/5   5/5   Adductor 5/5  5/5  Abductor 5/5  5/5  Knee flexors  5/5   5/5   Knee extensors  5/5   5/5   Dorsiflexors  5/5   5/5   Plantarflexors  5/5   5/5   Toe extensors  5/5   5/5   Toe flexors  5/5   5/5   Tone (Ashworth scale)  0  0   MSRs:  Right        Left                  brachioradialis 2+  2+  biceps 2+  2+  triceps 2+  2+  patellar 2+  2+  ankle jerk 2+  2+  Hoffman no  no  plantar response down  down   SENSORY:  Normal and symmetric perception of light touch, pinprick, vibration, and proprioception.  Romberg's sign absent.   COORDINATION/GAIT: Normal finger-to- nose-finger.  Intact rapid alternating movements bilaterally.  Gait narrow based and stable.Stressed gait and tandem gait intact.    IMPRESSION: Benign paroxysmal peripheral vertigo, resolved.   - If symptoms recur, recommend vestibular therapy  - Patient reassured that I did  not see any worrisome findings to indicate her vertigo was stemming from intracranial pathology Memory changes, no signs of dementia  -  Continue cognitive exercises as she is doing  - Neuropsychological testing can be performed, if symptoms get worse  Return to clinic as needed  Thank you for allowing me to participate in patient's care.  If I can answer any additional questions, I would be pleased to do so.    Sincerely,    Andersson Larrabee K. Posey Pronto, DO

## 2021-07-19 NOTE — Patient Instructions (Signed)
If your symptoms get worse, we can refer your for vestibular therapy  Return to clinic as needed

## 2021-07-23 ENCOUNTER — Telehealth: Payer: Self-pay

## 2021-07-23 ENCOUNTER — Ambulatory Visit: Payer: Medicare HMO

## 2021-07-23 NOTE — Telephone Encounter (Signed)
This nurse called patient in order to perform scheduled telephonic AWV. Patient stated that it was not a good time and would like some one to call her at a later time to reschedule.

## 2021-07-25 ENCOUNTER — Other Ambulatory Visit: Payer: Self-pay

## 2021-07-25 ENCOUNTER — Ambulatory Visit (INDEPENDENT_AMBULATORY_CARE_PROVIDER_SITE_OTHER): Payer: Medicare HMO

## 2021-07-25 ENCOUNTER — Ambulatory Visit: Payer: Medicare HMO | Admitting: Podiatry

## 2021-07-25 ENCOUNTER — Encounter: Payer: Self-pay | Admitting: Podiatry

## 2021-07-25 DIAGNOSIS — M79671 Pain in right foot: Secondary | ICD-10-CM

## 2021-07-25 DIAGNOSIS — M76821 Posterior tibial tendinitis, right leg: Secondary | ICD-10-CM

## 2021-07-25 DIAGNOSIS — R600 Localized edema: Secondary | ICD-10-CM

## 2021-07-25 MED ORDER — TRIAMCINOLONE ACETONIDE 10 MG/ML IJ SUSP
10.0000 mg | Freq: Once | INTRAMUSCULAR | Status: AC
  Administered 2021-07-25: 10 mg

## 2021-07-25 NOTE — Progress Notes (Signed)
Subjective:   Patient ID: Shelley Wright, female   DOB: 75 y.o.   MRN: [id number]   HPI Patient presents stating she is developed a lot of swelling in her right foot and has had a lot of pain in the inside of the right ankle that has occurred recently.  Does not remember injury or other pathology and patient does not smoke likes to be active with no history of systemic disease   Review of Systems  All other systems reviewed and are negative.      Objective:  Physical Exam Vitals and nursing note reviewed.  Constitutional:      Appearance: She is well-developed.  Pulmonary:     Effort: Pulmonary effort is normal.  Musculoskeletal:        General: Normal range of motion.  Skin:    General: Skin is warm.  Neurological:     Mental Status: She is alert.    Neurovascular status intact muscle strength adequate range of motion within normal limits with patient found to have discomfort in the right posterior tibial tendon as it comes under the medial malleolus and +1 and +2 pitting edema of the right foot and ankle.  Negative Bevelyn Buckles' sign was noted no indication shortness of breath or indications of blood clot and quite a bit of pain associated with the inside of the ankle.  Good digital perfusion well oriented x3     Assessment:  Possibility for a posterior tibial inflammation right no indications of muscle dysfunction with also edema which may be compensatory or other pathology with no indications currently of blood clot H&P     Plan:  The x-rays reviewed and I went ahead and I did do a careful sheath injection right 3 mg dexamethasone Kenalog 5 mg Xylocaine I did apply a Unna boot to try to take the swelling down and milk it out of the area I did discuss what to do if any shortness of breath or leg pain were to occur and I advised her to leave this on 3 to 4 days but take it off earlier if any other pathology were to occur.  Reappoint to recheck with surgical shoe dispensed  today  X-rays were negative for signs of bony pathology or encroachment

## 2021-07-31 ENCOUNTER — Encounter: Payer: Self-pay | Admitting: Podiatry

## 2021-07-31 ENCOUNTER — Ambulatory Visit: Payer: Medicare HMO | Admitting: Podiatry

## 2021-07-31 ENCOUNTER — Other Ambulatory Visit: Payer: Self-pay

## 2021-07-31 DIAGNOSIS — M7751 Other enthesopathy of right foot: Secondary | ICD-10-CM

## 2021-07-31 DIAGNOSIS — R600 Localized edema: Secondary | ICD-10-CM

## 2021-07-31 DIAGNOSIS — M76821 Posterior tibial tendinitis, right leg: Secondary | ICD-10-CM

## 2021-07-31 MED ORDER — TRIAMCINOLONE ACETONIDE 10 MG/ML IJ SUSP
10.0000 mg | Freq: Once | INTRAMUSCULAR | Status: AC
  Administered 2021-07-31: 10 mg

## 2021-08-01 ENCOUNTER — Telehealth: Payer: Self-pay | Admitting: Internal Medicine

## 2021-08-01 NOTE — Telephone Encounter (Signed)
Spoke with patient to schedule awv. Patient stated she is to busy right now  call back in 2023

## 2021-08-01 NOTE — Progress Notes (Signed)
Subjective:   Patient ID: Shelley Wright, female   DOB: 75 y.o.   MRN: [id number]   HPI Patient states her swelling has improved and the pain on the inside of the ankle but is better but there is discomfort now more in the ankle itself and the outside of the ankle.  States that the pain has reduced but she is concerned because she has to stand a lot   ROS      Objective:  Physical Exam  Neurovascular status intact with patient's posterior inflammation around the tibial tendon improved with quite a bit of pain in the sinus tarsi right with fluid buildup.  There is localized edema also in the foot that has improved still present with negative Bevelyn Buckles' sign noted no signs of any blood clot     Assessment:  Doing much better posterior tibial tendinitis improvement of the localized pitting edema problem no indications of clotting with discomfort of the sinus tarsi right     Plan:  H&P reviewed condition.  At this point I did sterile prep and I injected the sinus tarsi right 3 mg Kenalog 5 mg Xylocaine and I went ahead and I applied ankle compression stocking to try to help with any localized swelling and again gave her advice if any other changes were to occur to inform us immediately

## 2021-08-07 DIAGNOSIS — Z20822 Contact with and (suspected) exposure to covid-19: Secondary | ICD-10-CM | POA: Diagnosis not present

## 2021-08-07 DIAGNOSIS — J209 Acute bronchitis, unspecified: Secondary | ICD-10-CM | POA: Diagnosis not present

## 2021-08-07 DIAGNOSIS — R0981 Nasal congestion: Secondary | ICD-10-CM | POA: Diagnosis not present

## 2021-08-12 ENCOUNTER — Ambulatory Visit: Payer: Medicare HMO | Admitting: Neurology

## 2021-08-19 ENCOUNTER — Telehealth: Payer: Self-pay | Admitting: *Deleted

## 2021-08-19 NOTE — Telephone Encounter (Signed)
Patient is calling because her (rt)foot is still real tender,is this a normal process of healing or  should she be seen for another injection?Please advise.

## 2021-08-21 NOTE — Telephone Encounter (Signed)
Seeing her tomorrow

## 2021-08-22 ENCOUNTER — Other Ambulatory Visit: Payer: Self-pay

## 2021-08-22 ENCOUNTER — Encounter: Payer: Self-pay | Admitting: Podiatry

## 2021-08-22 ENCOUNTER — Ambulatory Visit: Payer: Medicare HMO | Admitting: Podiatry

## 2021-08-22 DIAGNOSIS — M76821 Posterior tibial tendinitis, right leg: Secondary | ICD-10-CM | POA: Diagnosis not present

## 2021-08-23 DIAGNOSIS — M76821 Posterior tibial tendinitis, right leg: Secondary | ICD-10-CM | POA: Diagnosis not present

## 2021-08-23 MED ORDER — TRIAMCINOLONE ACETONIDE 10 MG/ML IJ SUSP
10.0000 mg | Freq: Once | INTRAMUSCULAR | Status: AC
  Administered 2021-08-23: 10 mg

## 2021-08-23 NOTE — Progress Notes (Signed)
Subjective:   Patient ID: Shelley Wright, female   DOB: 75 y.o.   MRN: [id number]   HPI Patient presents stating the area worked on seems better but I am having pain in the still outside of my right ankle and it has some inflammation associated with it.  Patient does have a walking boot at home   ROS      Objective:  Physical Exam  Neurovascular status intact with inflammation which is in the right medial ankle now with the other area doing better on the outside     Assessment:  Posterior tibial tendinitis right with improved sinus tarsitis right with moderate depression of the arch     Plan:  Reviewed again sheath injection did explain chances for rupture associated with that and patient wants to continue to pursue the same treatment options.  Today I went ahead and I carefully injected the sheath of the posterior tibial 3 mg dexamethasone Kenalog 5 mg Xylocaine and then applied a fascial brace to lift up the arch with instructions on usage.  Patient will be seen back for [place] to recheck

## 2021-09-23 DIAGNOSIS — M25552 Pain in left hip: Secondary | ICD-10-CM | POA: Diagnosis not present

## 2021-09-23 DIAGNOSIS — M7542 Impingement syndrome of left shoulder: Secondary | ICD-10-CM | POA: Diagnosis not present

## 2021-09-23 DIAGNOSIS — M7062 Trochanteric bursitis, left hip: Secondary | ICD-10-CM | POA: Diagnosis not present

## 2021-09-24 ENCOUNTER — Other Ambulatory Visit: Payer: Self-pay | Admitting: Internal Medicine

## 2021-09-26 ENCOUNTER — Ambulatory Visit (INDEPENDENT_AMBULATORY_CARE_PROVIDER_SITE_OTHER): Payer: Medicare HMO

## 2021-09-26 VITALS — Ht 63.0 in | Wt 160.0 lb

## 2021-09-26 DIAGNOSIS — Z0000 Encounter for general adult medical examination without abnormal findings: Secondary | ICD-10-CM | POA: Diagnosis not present

## 2021-09-26 NOTE — Patient Instructions (Addendum)
Shelley Wright , Thank you for taking time to come for your Medicare Wellness Visit. I appreciate your ongoing commitment to your health goals. Please review the following plan we discussed and let me know if I can assist you in the future.   These are the goals we discussed:  Goals      Patient Stated     Lose weight.     Quit smoking / using tobacco        This is a list of the screening recommended for you and due dates:  Health Maintenance  Topic Date Due   COVID-19 Vaccine (5 - Booster for Pfizer series) 10/12/2021*   Flu Shot  12/13/2021*   Zoster (Shingles) Vaccine (1 of 2) 12/25/2021*   Tetanus Vaccine  09/26/2022*   Colon Cancer Screening  12/30/2022   Mammogram  06/18/2023   Pneumonia Vaccine  Completed   DEXA scan (bone density measurement)  Completed   Hepatitis C Screening: USPSTF Recommendation to screen - Ages 46-79 yo.  Completed   HPV Vaccine  Aged Out  *Topic was postponed. The date shown is not the original due date.   Shelley Wright ,  Advanced directives: Yes  Conditions/risks identified: None  Next appointment: Follow up in one year for your annual wellness visit    Preventive Care 65 Years and Older, Female Preventive care refers to lifestyle choices and visits with your health care provider that can promote health and wellness. What does preventive care include? A yearly physical exam. This is also called an annual well check. Dental exams once or twice a year. Routine eye exams. Ask your health care provider how often you should have your eyes checked. Personal lifestyle choices, including: Daily care of your teeth and gums. Regular physical activity. Eating a healthy diet. Avoiding tobacco and drug use. Limiting alcohol use. Practicing safe sex. Taking low-dose aspirin every day. Taking vitamin and mineral supplements as recommended by your health care provider. What happens during an annual well check? The services and screenings done by your  health care provider during your annual well check will depend on your age, overall health, lifestyle risk factors, and family history of disease. Counseling  Your health care provider may ask you questions about your: Alcohol use. Tobacco use. Drug use. Emotional well-being. Home and relationship well-being. Sexual activity. Eating habits. History of falls. Memory and ability to understand (cognition). Work and work Statistician. Reproductive health. Screening  You may have the following tests or measurements: Height, weight, and BMI. Blood pressure. Lipid and cholesterol levels. These may be checked every 5 years, or more frequently if you are over 13 years old. Skin check. Lung cancer screening. You may have this screening every year starting at age 63 if you have a 30-pack-year history of smoking and currently smoke or have quit within the past 15 years. Fecal occult blood test (FOBT) of the stool. You may have this test every year starting at age 35. Flexible sigmoidoscopy or colonoscopy. You may have a sigmoidoscopy every 5 years or a colonoscopy every 10 years starting at age 49. Hepatitis C blood test. Hepatitis B blood test. Sexually transmitted disease (STD) testing. Diabetes screening. This is done by checking your blood sugar (glucose) after you have not eaten for a while (fasting). You may have this done every 1-3 years. Bone density scan. This is done to screen for osteoporosis. You may have this done starting at age 39. Mammogram. This may be done every 1-2 years. Talk  to your health care provider about how often you should have regular mammograms. Talk with your health care provider about your test results, treatment options, and if necessary, the need for more tests. Vaccines  Your health care provider may recommend certain vaccines, such as: Influenza vaccine. This is recommended every year. Tetanus, diphtheria, and acellular pertussis (Tdap, Td) vaccine. You may  need a Td booster every 10 years. Zoster vaccine. You may need this after age 67. Pneumococcal 13-valent conjugate (PCV13) vaccine. One dose is recommended after age 85. Pneumococcal polysaccharide (PPSV23) vaccine. One dose is recommended after age 37. Talk to your health care provider about which screenings and vaccines you need and how often you need them. This information is not intended to replace advice given to you by your health care provider. Make sure you discuss any questions you have with your health care provider. Document Released: 09/28/2015 Document Revised: 05/21/2016 Document Reviewed: 07/03/2015 Elsevier Interactive Patient Education  2017 [place] Prevention in the Home Falls can cause injuries. They can happen to people of all ages. There are many things you can do to make your home safe and to help prevent falls. What can I do on the outside of my home? Regularly fix the edges of walkways and driveways and fix any cracks. Remove anything that might make you trip as you walk through a door, such as a raised step or threshold. Trim any bushes or trees on the path to your home. Use bright outdoor lighting. Clear any walking paths of anything that might make someone trip, such as rocks or tools. Regularly check to see if handrails are loose or broken. Make sure that both sides of any steps have handrails. Any raised decks and porches should have guardrails on the edges. Have any leaves, snow, or ice cleared regularly. Use sand or salt on walking paths during winter. Clean up any spills in your garage right away. This includes oil or grease spills. What can I do in the bathroom? Use night lights. Install grab bars by the toilet and in the tub and shower. Do not use towel bars as grab bars. Use non-skid mats or decals in the tub or shower. If you need to sit down in the shower, use a plastic, non-slip stool. Keep the floor dry. Clean up any water that spills on  the floor as soon as it happens. Remove soap buildup in the tub or shower regularly. Attach bath mats securely with double-sided non-slip rug tape. Do not have throw rugs and other things on the floor that can make you trip. What can I do in the bedroom? Use night lights. Make sure that you have a light by your bed that is easy to reach. Do not use any sheets or blankets that are too big for your bed. They should not hang down onto the floor. Have a firm chair that has side arms. You can use this for support while you get dressed. Do not have throw rugs and other things on the floor that can make you trip. What can I do in the kitchen? Clean up any spills right away. Avoid walking on wet floors. Keep items that you use a lot in easy-to-reach places. If you need to reach something above you, use a strong step stool that has a grab bar. Keep electrical cords out of the way. Do not use floor polish or wax that makes floors slippery. If you must use wax, use non-skid floor wax. Do  not have throw rugs and other things on the floor that can make you trip. What can I do with my stairs? Do not leave any items on the stairs. Make sure that there are handrails on both sides of the stairs and use them. Fix handrails that are broken or loose. Make sure that handrails are as long as the stairways. Check any carpeting to make sure that it is firmly attached to the stairs. Fix any carpet that is loose or worn. Avoid having throw rugs at the top or bottom of the stairs. If you do have throw rugs, attach them to the floor with carpet tape. Make sure that you have a light switch at the top of the stairs and the bottom of the stairs. If you do not have them, ask someone to add them for you. What else can I do to help prevent falls? Wear shoes that: Do not have high heels. Have rubber bottoms. Are comfortable and fit you well. Are closed at the toe. Do not wear sandals. If you use a stepladder: Make sure  that it is fully opened. Do not climb a closed stepladder. Make sure that both sides of the stepladder are locked into place. Ask someone to hold it for you, if possible. Clearly mark and make sure that you can see: Any grab bars or handrails. First and last steps. Where the edge of each step is. Use tools that help you move around (mobility aids) if they are needed. These include: Canes. Walkers. Scooters. Crutches. Turn on the lights when you go into a dark area. Replace any light bulbs as soon as they burn out. Set up your furniture so you have a clear path. Avoid moving your furniture around. If any of your floors are uneven, fix them. If there are any pets around you, be aware of where they are. Review your medicines with your doctor. Some medicines can make you feel dizzy. This can increase your chance of falling. Ask your doctor what other things that you can do to help prevent falls. This information is not intended to replace advice given to you by your health care provider. Make sure you discuss any questions you have with your health care provider. Document Released: 06/28/2009 Document Revised: 02/07/2016 Document Reviewed: 10/06/2014 Elsevier Interactive Patient Education  2017 [employer].

## 2021-09-26 NOTE — Progress Notes (Signed)
Subjective:   Shelley Wright is a 76 y.o. female who presents for Medicare Annual (Subsequent) preventive examination.  Review of Systems    No ROS Cardiac Risk Factors include: advanced age (>1mn, >>97women);hypertension    Objective:    Today's Vitals   09/26/21 1031  Weight: 160 lb (72.6 kg)  Height: 5' 3"  (1.6 m)   Body mass index is 28.34 kg/m.  Advanced Directives 09/26/2021 07/19/2021 07/17/2020 09/21/2015  Does Patient Have a Medical Advance Directive? Yes Yes Yes Yes  Type of AParamedicof AOrestesLiving will - HBradleyLiving will -  Does patient want to make changes to medical advance directive? No - Patient declined - - -  Copy of [place]? No - copy requested - No - copy requested No - copy requested    Current Medications (verified) Outpatient Encounter Medications as of 09/26/2021  Medication Sig   diclofenac (VOLTAREN) 75 MG EC tablet diclofenac sodium 75 mg tablet,delayed release   escitalopram (LEXAPRO) 10 MG tablet TAKE 1 TABLET EVERY DAY   lisinopril (ZESTRIL) 10 MG tablet TAKE 1 TABLET EVERY DAY   moxifloxacin (VIGAMOX) 0.5 % ophthalmic solution moxifloxacin 0.5 % eye drops  INSTILL 1 DROP 4 TIMES DAILY IN THE RIGHT EYE STARTING 2 DAYS PRIOR TO SURGERY AND 2 GTTS THE MORNING OF SURGERY   rosuvastatin (CRESTOR) 20 MG tablet Take 1 tablet (20 mg total) by mouth daily.   SHINGRIX injection    No facility-administered encounter medications on file as of 09/26/2021.    Allergies (verified) Patient has no known allergies.   History: Past Medical History:  Diagnosis Date   Anxiety    no per pt   Arthritis    GERD (gastroesophageal reflux disease)    Heart murmur    'YEARS AGO"   History of smoking    History of ulcerative colitis 2005   pancolitis    Hyperlipidemia    Hypertension    Normal nuclear stress test 2008   myoview    Past Surgical History:  Procedure Laterality  Date   BREAST BIOPSY Left 1986   biopsy x 2    COLONOSCOPY     KNEE SURGERY Left october   x 2   Family History  Problem Relation Age of Onset   Pancreatic cancer Father        deceased   Hypertension Father    Breast cancer Mother [id number]  Angina Mother    Hypertension Mother    Colon cancer Neg Hx    Colon polyps Neg Hx    Rectal cancer Neg Hx    Stomach cancer Neg Hx    Social History   Socioeconomic History   Marital status: Married    Spouse name: Not on file   Number of children: 4   Years of education: Not on file   Highest education level: Not on file  Occupational History   Not on file  Tobacco Use   Smoking status: Former    Types: Cigarettes    Quit date: 07/04/2014    Years since quitting: 7.2   Smokeless tobacco: Never  Vaping Use   Vaping Use: Never used  Substance and Sexual Activity   Alcohol use: No    Alcohol/week: 0.0 standard drinks   Drug use: No   Sexual activity: Not on file  Other Topics Concern   Not on file  Social History Narrative   Hh  Of  2    Pet dog    Off tobacco.   [employer].     Teacher runs school.   8 - 3    G4 P4    Husband had cva and blocked carotids    Left handed   Drinks caffeine   Two story home   Social Determinants of Health   Financial Resource Strain: Low Risk    Difficulty of Paying Living Expenses: Not hard at all  Food Insecurity: No Food Insecurity   Worried About Charity fundraiser in the Last Year: Never true   Ran Out of Food in the Last Year: Never true  Transportation Needs: No Transportation Needs   Lack of Transportation (Medical): No   Lack of Transportation (Non-Medical): No  Physical Activity: Inactive   Days of Exercise per Week: 0 days   Minutes of Exercise per Session: 0 min  Stress: No Stress Concern Present   Feeling of Stress : Not at all  Social Connections: Socially Integrated   Frequency of Communication with Friends and Family: More than three times a week   Frequency  of Social Gatherings with Friends and Family: More than three times a week   Attends Religious Services: More than 4 times per year   Active Member of [employer] or Organizations: Yes   Attends Archivist Meetings: More than 4 times per year   Marital Status: Married    Clinical Intake: How often do you need to have someone help you when you read instructions, pamphlets, or other written materials from your doctor or pharmacy?: 1 - Never  Diabetic? No  Interpreter Needed?: NoActivities of Daily Living In your present state of health, do you have any difficulty performing the following activities: 09/26/2021  Hearing? N  Vision? N  Difficulty concentrating or making decisions? N  Walking or climbing stairs? N  Dressing or bathing? N  Doing errands, shopping? N  Preparing Food and eating ? N  Using the Toilet? N  In the past six months, have you accidently leaked urine? N  Do you have problems with loss of bowel control? N  Managing your Medications? N  Managing your Finances? N  Housekeeping or managing your Housekeeping? N  Some recent data might be hidden    Patient Care Team: Panosh, Standley Brooking, MD as PCP - General Mauri Pole, MD as Consulting Physician (Gastroenterology) Rolm Bookbinder, MD as Consulting Physician (Dermatology)  Indicate any recent Medical Services you may have received from other than [facility] providers in the past year (date may be approximate).     Assessment:   This is a routine wellness examination for Shelley Wright.  Virtual Visit via Telephone Note  I connected with  Shelley Wright on 09/26/21 at 10:30 AM EST by telephone and verified that I am speaking with the correct person using two identifiers.  Location: Patient: Home Provider: Office Persons participating in the virtual visit: patient/Nurse Health Advisor   I discussed the limitations, risks, security and privacy concerns of performing an evaluation and management service by telephone and  the availability of in person appointments. The patient expressed understanding and agreed to proceed.  Interactive audio and video telecommunications were attempted between this nurse and patient, however failed, due to patient having technical difficulties OR patient did not have access to video capability.  We continued and completed visit with audio only.  Some vital signs may be absent or patient reported.   Criselda Peaches, LPN  Hearing/Vision screen Hearing Screening - Comments:: No difficulty hearing  Dietary issues and exercise activities discussed: Current Exercise Habits: The patient does not participate in regular exercise at present   Goals Addressed             This Visit's Progress    Patient Stated       Lose weight.       Depression Screen PHQ 2/9 Scores 09/26/2021 07/17/2020 07/04/2019 06/04/2018 06/02/2017 03/26/2016 11/01/2014  PHQ - 2 Score 0 0 0 0 0 0 0    Fall Risk Fall Risk  09/26/2021 07/19/2021 07/17/2020 07/04/2019 06/04/2018  Falls in the past year? 0 0 0 0 No  Comment - - - - -  Number falls in past yr: 0 0 0 0 -  Injury with Fall? 0 0 0 0 -  Risk for fall due to : - - Impaired vision - -  Follow up - - Falls prevention discussed Falls evaluation completed -    FALL RISK PREVENTION PERTAINING TO THE HOME:  Any stairs in or around the home? Yes  If so, are there any without handrails? No  Home free of loose throw rugs in walkways, pet beds, electrical cords, etc? Yes  Adequate lighting in your home to reduce risk of falls? Yes   ASSISTIVE DEVICES UTILIZED TO PREVENT FALLS:  Life alert? No  Use of a cane, walker or w/c? No  Grab bars in the bathroom? Yes  Shower chair or bench in shower? Yes  Elevated toilet seat or a handicapped toilet? No   TIMED UP AND GO:  Was the test performed? No . Audio Visit  Cognitive Function:     6CIT Screen 09/26/2021 07/17/2020  What Year? 0 points 0 points  What month? 0 points 0 points  Count back from  20 0 points 0 points  Months in reverse 0 points 0 points  Repeat phrase 0 points 0 points    Immunizations Immunization History  Administered Date(s) Administered   Influenza Split 06/13/2011, 07/05/2012, 06/15/2013   Influenza, High Dose Seasonal PF 06/02/2017, 05/28/2019   Influenza,inj,Quad PF,6+ Mos 06/04/2018   Influenza-Unspecified 06/24/2020   PFIZER(Purple Top)SARS-COV-2 Vaccination 10/07/2019, 10/28/2019, 06/24/2020, 06/24/2020   Pneumococcal Conjugate-13 10/18/2013   Pneumococcal Polysaccharide-23 11/01/2014    TDAP status: Due, Education has been provided regarding the importance of this vaccine. Advised may receive this vaccine at local pharmacy or Health Dept. Aware to provide a copy of the vaccination record if obtained from local pharmacy or Health Dept. Verbalized acceptance and understanding.  Flu Vaccine status: Up to date   Covid-19 vaccine status: Information provided on how to obtain vaccines.   Qualifies for Shingles Vaccine? Yes   Zostavax completed No   Shingrix Completed?: No.    Education has been provided regarding the importance of this vaccine. Patient has been advised to call insurance company to determine out of pocket expense if they have not yet received this vaccine. Advised may also receive vaccine at local pharmacy or Health Dept. Verbalized acceptance and understanding.  Screening Tests Health Maintenance  Topic Date Due   COVID-19 Vaccine (5 - Booster for Pfizer series) 10/12/2021 (Originally 08/19/2020)   INFLUENZA VACCINE  12/13/2021 (Originally 04/15/2021)   Zoster Vaccines- Shingrix (1 of 2) 12/25/2021 (Originally 10/02/1964)   TETANUS/TDAP  09/26/2022 (Originally 09/15/2016)   COLONOSCOPY (Pts 45-45yr Insurance coverage will need to be confirmed)  12/30/2022   MAMMOGRAM  06/18/2023   Pneumonia Vaccine 76 Years old  Completed   DEXA  SCAN  Completed   Hepatitis C Screening  Completed   HPV VACCINES  Aged Out    Health  Maintenance  There are no preventive care reminders to display for this patient.   Additional Screening:  Vision Screening: Recommended annual ophthalmology exams for early detection of glaucoma and other disorders of the eye. Is the patient up to date with their annual eye exam?  Yes  Who is the provider or what is the name of the office in which the patient attends annual eye exams? Followed by Dr Prudencio Burly  Dental Screening: Recommended annual dental exams for proper oral hygiene  Community Resource Referral / Chronic Care Management:  CRR required this visit?  No   CCM required this visit?  No      Plan:     I have personally reviewed and noted the following in the patients chart:   Medical and social history Use of alcohol, tobacco or illicit drugs  Current medications and supplements including opioid prescriptions. Patient currently not taking opioids Functional ability and status Nutritional status Physical activity Advanced directives List of other physicians Hospitalizations, surgeries, and ER visits in previous 12 months Vitals Screenings to include cognitive, depression, and falls Referrals and appointments  In addition, I have reviewed and discussed with patient certain preventive protocols, quality metrics, and best practice recommendations. A written personalized care plan for preventive services as well as general preventive health recommendations were provided to patient.     Criselda Peaches, LPN   [id number]

## 2021-10-04 DIAGNOSIS — M7542 Impingement syndrome of left shoulder: Secondary | ICD-10-CM | POA: Diagnosis not present

## 2021-10-09 ENCOUNTER — Other Ambulatory Visit: Payer: Self-pay | Admitting: Internal Medicine

## 2021-10-30 DIAGNOSIS — L821 Other seborrheic keratosis: Secondary | ICD-10-CM | POA: Diagnosis not present

## 2021-10-30 DIAGNOSIS — D225 Melanocytic nevi of trunk: Secondary | ICD-10-CM | POA: Diagnosis not present

## 2021-10-30 DIAGNOSIS — L603 Nail dystrophy: Secondary | ICD-10-CM | POA: Diagnosis not present

## 2021-10-30 DIAGNOSIS — D2272 Melanocytic nevi of left lower limb, including hip: Secondary | ICD-10-CM | POA: Diagnosis not present

## 2021-10-30 DIAGNOSIS — D1801 Hemangioma of skin and subcutaneous tissue: Secondary | ICD-10-CM | POA: Diagnosis not present

## 2021-10-30 DIAGNOSIS — C44519 Basal cell carcinoma of skin of other part of trunk: Secondary | ICD-10-CM | POA: Diagnosis not present

## 2021-10-30 DIAGNOSIS — D485 Neoplasm of uncertain behavior of skin: Secondary | ICD-10-CM | POA: Diagnosis not present

## 2021-11-06 ENCOUNTER — Ambulatory Visit: Payer: Medicare HMO | Admitting: Family Medicine

## 2021-11-06 ENCOUNTER — Encounter: Payer: Self-pay | Admitting: Family Medicine

## 2021-11-06 ENCOUNTER — Ambulatory Visit: Payer: Self-pay

## 2021-11-06 VITALS — BP 110/72 | Ht 63.0 in | Wt 150.0 lb

## 2021-11-06 DIAGNOSIS — M79671 Pain in right foot: Secondary | ICD-10-CM

## 2021-11-06 DIAGNOSIS — M25552 Pain in left hip: Secondary | ICD-10-CM

## 2021-11-06 NOTE — Progress Notes (Signed)
PCP: Burnis Medin, MD  Subjective:   HPI: Patient is a 76 y.o. female here for right foot, left hip pain.  Patient reports about 1.5 months of right foot pain. Started when she woke up - no acute injury. Pain was laterally initially - seen by podiatry and had injection which helped but then pain was more medial. Had 2 more injections medial ankle and foot but continues to struggle with medial foot and ankle pain. Notices more when walking barefoot. Wearing more supportive shoes now. Also had several months of lateral left hip pain - had been getting bursal injections every 3 months for this but without lasting relief. No back pain, numbness.  Past Medical History:  Diagnosis Date   Anxiety    no per pt   Arthritis    GERD (gastroesophageal reflux disease)    Heart murmur    'YEARS AGO"   History of smoking    History of ulcerative colitis 2005   pancolitis    Hyperlipidemia    Hypertension    Normal nuclear stress test 2008   myoview     Current Outpatient Medications on File Prior to Visit  Medication Sig Dispense Refill   diclofenac (VOLTAREN) 75 MG EC tablet diclofenac sodium 75 mg tablet,delayed release     escitalopram (LEXAPRO) 10 MG tablet TAKE 1 TABLET EVERY DAY 90 tablet 0   lisinopril (ZESTRIL) 10 MG tablet TAKE 1 TABLET EVERY DAY 90 tablet 1   moxifloxacin (VIGAMOX) 0.5 % ophthalmic solution moxifloxacin 0.5 % eye drops  INSTILL 1 DROP 4 TIMES DAILY IN THE RIGHT EYE STARTING 2 DAYS PRIOR TO SURGERY AND 2 GTTS THE MORNING OF SURGERY     rosuvastatin (CRESTOR) 20 MG tablet Take 1 tablet (20 mg total) by mouth daily. 90 tablet 0   SHINGRIX injection      No current facility-administered medications on file prior to visit.    Past Surgical History:  Procedure Laterality Date   BREAST BIOPSY Left 1986   biopsy x 2    COLONOSCOPY     KNEE SURGERY Left october   x 2    No Known Allergies  BP 110/72    Ht 5' 3"  (1.6 m)    Wt 150 lb (68 kg)    BMI 26.57  kg/m   [facility] 11/06/2021  Frequency of aerobic exercise (# of days/week) 2  Average time in minutes 15  Frequency of strengthening activities (# of days/week) 0    No flowsheet data found.      Objective:  Physical Exam:  Gen: NAD, comfortable in exam room  Right foot/ankle: Pes planus more prominent than on left.  Splaying of digits.  No other gross deformity, swelling, ecchymoses FROM with mild pain on IR. TTP over post tib tendon from insertion proximally to superior to malleolus. Negative ant drawer and negative talar tilt.   Negative syndesmotic compression. Thompsons test negative. NV intact distally.  Left hip: No deformity. FROM with 4/5 strength hip abduction. Tenderness to palpation greater trochanter and gluteal tendons. NVI distally. Negative logroll Negative faber, fadir, and piriformis stretches.  Limited MSK u/s right foot/ankle:  Medial malleolus normal.  Post tib tendon very thickened near insertion with intratendinous calcification and notable neovascularity.  Mild tenosynovitis.  Flexor digitorum and flexor hallucis appear normal.   Assessment & Plan:  1. Right foot/ankle pain - 2/2 post tib tendinopathy/tenosynovitis.  Arch supports, home exercises, icing, voltaren gel.  Consider nitro patches.  Avoid steroid injections.  2. Left hip pain - 2/2 greater trochanteric pain syndrome.  Home exercises and stretches.  Icing, voltaren gel.  F/u in 5-6 weeks.

## 2021-11-06 NOTE — Patient Instructions (Signed)
Your foot pain is due to posterior tibialis tendinopathy confirmed with today's ultrasound findings (fluid in the tendon and tendon sheath, increased blood flow to the tendon) Arch support is very important for this (spencos, superfeet, Hapad insoles with small scaphoid pads). Avoid flat shoes, barefoot walking as much as possible. Home exercises with theraband - consider formal physical therapy. Arch binder may help as well. Voltaren gel up to 4 times a day. Nitro patches are a consideration also.  You have greater trochanteric pain syndrome of your hip (this condition is a combination of bursitis, IT band syndrome/tightness, gluteal weakness, and gluteal tendinitis). The steroid injection you've received only treats one of these which is why it only helps a little. Avoid painful activities as much as possible. Ice over area of pain 3-4 times a day for 15 minutes at a time Voltaren gel up to 4 times a day topically. Hip side raise exercise 3 sets of 10 once a day - add weights if this becomes too easy. Stretches - pick 2-3 and hold for 20-30 seconds x 3 - do once or twice a day. If not improving, can consider physical therapy and/or nitro patches. Follow up with me in 5-6 weeks.

## 2021-11-13 DIAGNOSIS — Z85828 Personal history of other malignant neoplasm of skin: Secondary | ICD-10-CM | POA: Diagnosis not present

## 2021-11-13 DIAGNOSIS — C44519 Basal cell carcinoma of skin of other part of trunk: Secondary | ICD-10-CM | POA: Diagnosis not present

## 2021-12-11 ENCOUNTER — Ambulatory Visit: Payer: Medicare HMO | Admitting: Family Medicine

## 2021-12-11 ENCOUNTER — Encounter: Payer: Self-pay | Admitting: Family Medicine

## 2021-12-11 VITALS — BP 137/85 | Ht 63.0 in | Wt 150.0 lb

## 2021-12-11 DIAGNOSIS — M25552 Pain in left hip: Secondary | ICD-10-CM

## 2021-12-11 DIAGNOSIS — M79671 Pain in right foot: Secondary | ICD-10-CM

## 2021-12-11 MED ORDER — NITROGLYCERIN 0.2 MG/HR TD PT24: MEDICATED_PATCH | TRANSDERMAL | 1 refills | Status: DC

## 2021-12-11 NOTE — Progress Notes (Signed)
PCP: Burnis Medin, MD ? ?Subjective:  ? ?HPI: ?Patient is a 76 y.o. female here for right foot, left hip pain. ? ?2/22: ?Patient reports about 1.5 months of right foot pain. ?Started when she woke up - no acute injury. ?Pain was laterally initially - seen by podiatry and had injection which helped but then pain was more medial. ?Had 2 more injections medial ankle and foot but continues to struggle with medial foot and ankle pain. ?Notices more when walking barefoot. ?Wearing more supportive shoes now. ?Also had several months of lateral left hip pain - had been getting bursal injections every 3 months for this but without lasting relief. ?No back pain, numbness. ? ?3/29: ?Patient reports she has been doing home exercises and using theragun for her right ankle/lower leg/foot and noticed improvement. ?Just started wearing sandals with good arch support. ?Mild improvement here since last visit. ?Left lateral hip unchanged, very sore. ? ?Past Medical History:  ?Diagnosis Date  ? Anxiety   ? no per pt  ? Arthritis   ? GERD (gastroesophageal reflux disease)   ? Heart murmur   ? 'YEARS AGO"  ? History of smoking   ? History of ulcerative colitis 2005  ? pancolitis   ? Hyperlipidemia   ? Hypertension   ? Normal nuclear stress test 2008  ? myoview   ? ? ?Current Outpatient Medications on File Prior to Visit  ?Medication Sig Dispense Refill  ? diclofenac (VOLTAREN) 75 MG EC tablet diclofenac sodium 75 mg tablet,delayed release    ? escitalopram (LEXAPRO) 10 MG tablet TAKE 1 TABLET EVERY DAY 90 tablet 0  ? lisinopril (ZESTRIL) 10 MG tablet TAKE 1 TABLET EVERY DAY 90 tablet 1  ? moxifloxacin (VIGAMOX) 0.5 % ophthalmic solution moxifloxacin 0.5 % eye drops ? INSTILL 1 DROP 4 TIMES DAILY IN THE RIGHT EYE STARTING 2 DAYS PRIOR TO SURGERY AND 2 GTTS THE MORNING OF SURGERY    ? rosuvastatin (CRESTOR) 20 MG tablet Take 1 tablet (20 mg total) by mouth daily. 90 tablet 0  ? SHINGRIX injection     ? ?No current facility-administered  medications on file prior to visit.  ? ? ?Past Surgical History:  ?Procedure Laterality Date  ? BREAST BIOPSY Left 1986  ? biopsy x 2   ? COLONOSCOPY    ? KNEE SURGERY Left october  ? x 2  ? ? ?No Known Allergies ? ?BP 137/85   Ht 5' 3"  (1.6 m)   Wt 150 lb (68 kg)   BMI 26.57 kg/m?  ? ? ?  11/06/2021  ?  2:33 PM  ?[place] Adult Exercise  ?Frequency of aerobic exercise (# of days/week) 2  ?Average time in minutes 15  ?Frequency of strengthening activities (# of days/week) 0  ? ? ?   ? View : No data to display.  ?  ?  ?  ? ? ?    ?Objective:  ?Physical Exam: ? ?Gen: NAD, comfortable in exam room ? ?Right foot/ankle: ?No gross deformity, swelling, ecchymoses ?FROM without pain on IR currently ?Mild TTP post tib tendon.  No other tenderness. ?Thompsons test negative. ?NV intact distally. ? ?Left hip: ?No deformity. ?FROM with 4/5 strength. ?Tenderness to palpation greater trochanter and gluteal tendons. ?NVI distally. ?Negative logroll ? ?Assessment & Plan:  ?1. Right foot/ankle pain - 2/2 post tib tendinopathy/tenosynovitis.  Continue arch support, exercises, icing, voltaren gel.  Add nitro patches.  Avoid steroid injections. ? ?2. Left hip pain - 2/2  greater trochanteric pain syndrome and gluteal tendinopathy.  Start physical therapy, continue home exercises.  F/u in 5-6 weeks. ?

## 2021-12-11 NOTE — Patient Instructions (Addendum)
Continue with arch support, exercises for your ankle. ?Add nitro patches 1/4th patch to the ankle, change daily in a slightly different spot. ?Icing 15 minutes at a time if needed. ? ?Start physical therapy for your hip. ?Do home exercises on days you don't go to therapy. ?Follow up with me in 5-6 weeks. ?

## 2021-12-14 ENCOUNTER — Other Ambulatory Visit: Payer: Self-pay | Admitting: Internal Medicine

## 2021-12-18 ENCOUNTER — Ambulatory Visit: Payer: Medicare HMO | Attending: Family Medicine

## 2021-12-18 DIAGNOSIS — M79671 Pain in right foot: Secondary | ICD-10-CM | POA: Diagnosis not present

## 2021-12-18 DIAGNOSIS — M6281 Muscle weakness (generalized): Secondary | ICD-10-CM | POA: Diagnosis not present

## 2021-12-18 DIAGNOSIS — R262 Difficulty in walking, not elsewhere classified: Secondary | ICD-10-CM | POA: Diagnosis not present

## 2021-12-18 DIAGNOSIS — M25552 Pain in left hip: Secondary | ICD-10-CM | POA: Diagnosis not present

## 2021-12-18 NOTE — Therapy (Signed)
?OUTPATIENT PHYSICAL THERAPY LOWER EXTREMITY EVALUATION ? ? ?Patient Name: Shelley Wright ?MRN: [id number] ?DOB:03-08-1946, 76 y.o., female ?Today's Date: 12/18/2021 ? ? PT End of Session - 12/18/21 1410   ? ? Visit Number 1   ? Date for PT Re-Evaluation 02/12/22   ? Authorization Type [employer]   ? PT Start Time 1402   ? PT Stop Time 1450   ? PT Time Calculation (min) 48 min   ? Activity Tolerance Patient tolerated treatment well   ? Behavior During Therapy [facility] for tasks assessed/performed   ? ?  ?  ? ?  ? ? ?Past Medical History:  ?Diagnosis Date  ? Anxiety   ? no per pt  ? Arthritis   ? GERD (gastroesophageal reflux disease)   ? Heart murmur   ? 'YEARS AGO"  ? History of smoking   ? History of ulcerative colitis 2005  ? pancolitis   ? Hyperlipidemia   ? Hypertension   ? Normal nuclear stress test 2008  ? myoview   ? ?Past Surgical History:  ?Procedure Laterality Date  ? BREAST BIOPSY Left 1986  ? biopsy x 2   ? COLONOSCOPY    ? KNEE SURGERY Left october  ? x 2  ? ?Patient Active Problem List  ? Diagnosis Date Noted  ? Family history of cardiovascular disease 07/05/2012  ? Varicose veins 07/05/2012  ? Ex-smoker 11/14/2011  ? Myalgia 04/05/2011  ? History of ulcerative colitis   ? CHEST WALL PAIN, ANTERIOR 07/02/2009  ? ULCERATIVE COLITIS, HX OF 04/17/2008  ? HYPERLIPIDEMIA 04/15/2007  ? ANXIETY 04/15/2007  ? DEPRESSION 04/15/2007  ? HYPERTENSION 04/15/2007  ? ? ?PCP: Burnis Medin, MD ? ?REFERRING PROVIDER: Dene Gentry, MD ? ?REFERRING DIAG: M25.552 (ICD-10-CM) - Left hip pain  ? ?THERAPY DIAG:  ?Pain in left hip ? ?Difficulty in walking, not elsewhere classified ? ?Pain in right foot ? ?Muscle weakness (generalized) ? ?ONSET DATE: 11/13/21 ? ?SUBJECTIVE:  ? ?SUBJECTIVE STATEMENT: ?Patient states she began having hip pain several weeks ago. She also has right foot pain.  She feels the hip pain started first.  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  She explains that she can do all  routine daily activities without issue but prolonged standing or walking cause increase pain. 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   ? ?PERTINENT HISTORY: ?Hx of left TKA ? ?PAIN:  ?Are you having pain? Yes: Pain location: right foot / left hip ?Pain description: 10/10 in right foot at worst/ 10/10 at worst left hip ?Aggravating factors: walking  ?Relieving factors: nitro patches, rest, anti-inflammatory ? ?PRECAUTIONS: None ? ?WEIGHT BEARING RESTRICTIONS No ? ?FALLS:  ?Has patient fallen in last 6 months? No ? ?LIVING ENVIRONMENT: ?Lives with: lives with their spouse ?Lives in: House/apartment ?Stairs: Yes: Internal: 12 steps; can reach both and External: 5 steps; can reach both ?Has following equipment at home: None ? ?OCCUPATION: retired just last year ? 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 ?PATIENT GOALS To be able to walk without pain and to have better balance ? ? ?OBJECTIVE:  ? ?DIAGNOSTIC FINDINGS: Left hip : unremarkable  Right foot: unremarkable (diagnoses as tendinitis) ? ?PATIENT SURVEYS:  ?FOTO 57 ? ?COGNITION: ? Overall cognitive status: Within functional limits for  tasks assessed   ?  ?SENSATION: ?WFL ? ?MUSCLE LENGTH: ?Hamstrings: Right 60 deg; Left 60 deg ?Thomas test: Positive bilaterally ? ?POSTURE:  ?Normal with slight decreased lumbar lordosis ? ?PALPATION: ?Tender and tight bilateral multifidi ? ?LE Active ROM:  WFL ? ?LE MMT:  generally 4 to 4+/5 ? ? ?LOWER EXTREMITY SPECIAL TESTS:  ?Hip special tests: Saralyn Pilar (FABER) test: positive , Trendelenburg test: negative, and Thomas test: positive  ? ?FUNCTIONAL TESTS:  ?5 times sit to stand: 11 ? ?GAIT: ?Distance walked: 50 ?Assistive device utilized: None ?Level of assistance: Complete Independence ?Comments: antalgic ? ? ? ?TODAY'S TREATMENT: ?Initial eval completed and initiated HEP ? ? ?PATIENT  EDUCATION:  ?Education details: Initiated HEP ?Person educated: Patient ?Education method: Explanation ?Education comprehension: verbalized understanding, returned demonstration, and verbal cues required ? ? ?HOME EXERCISE PROGRAM: ?Access Code: [email or web address] ?URL: [email or web address] ?Date: 12/18/2021 ?Prepared by: Candyce Churn ? ?Exercises ?- Seated Figure 4 Piriformis Stretch  - 1 x daily - 7 x weekly - 3 sets - 10 reps ?- Supine ITB Stretch with Strap  - 1 x daily - 7 x weekly - 3 sets - 10 reps ?- Sidelying Hip Abduction  - 1 x daily - 7 x weekly - 3 sets - 10 reps ?- Prone Hip Extension  - 1 x daily - 7 x weekly - 3 sets - 10 reps ?- Sidelying Hip Adduction  - 1 x daily - 7 x weekly - 3 sets - 10 reps ?- Small Range Straight Leg Raise  - 1 x daily - 7 x weekly - 3 sets - 10 reps ? ?ASSESSMENT: ? ?CLINICAL IMPRESSION: ?Patient is a 76 y.o. female who was seen today for physical therapy evaluation and treatment for left hip and right foot pain. She presents with bilateral pes planus and poor LE alignment, weakness bilateral LE's and limited hamstring and hip rotator flexibility.  She should respond well to LE flexibility and strengthening, right ankle stability training as this is likely contributing toward her left hip issues and pain control.  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   ? ? ?OBJECTIVE IMPAIRMENTS Abnormal gait, decreased balance, decreased knowledge of condition, difficulty walking, decreased ROM, decreased strength, increased fascial restrictions, increased muscle spasms, impaired flexibility, improper body mechanics, postural dysfunction, and pain.  ? ?ACTIVITY LIMITATIONS cleaning, community activity, driving, occupation, laundry, yard work, and shopping.  ? ?PERSONAL FACTORS Age, Education, Fitness, Past/current experiences, and Profession are also affecting patient's functional  outcome.  ? ? ?REHAB POTENTIAL: Good ? ?CLINICAL DECISION MAKING: Stable/uncomplicated ? ?EVALUATION COMPLEXITY: Low ? ? ?GOALS: ?Goals reviewed with patient? Yes ? ?SHORT TERM GOALS: Target date: 01/15/2022 ? ?Patient will be independent with initial HEP  ?Baseline: ?Goal status: INITIAL ? ?2.  Pain report to be no greater than 4/10  ?Baseline:  ?Goal status: INITIAL ? ?3.  Patient to report 25% improvement in overall symptoms ?Baseline:  ?Goal status: INITIAL ? ? ? ?LONG TERM GOALS: Target date: 02/12/2022 ? ?Patient to be independent with advanced HEP  ?Baseline:  ?Goal status: INITIAL ? ?2.  Patient to report pain no greater than 2/10  ?Baseline:  ?Goal status: INITIAL ? ?3.  Patient to be able to walk 1 mile without hip pain ?Baseline:  ?Goal status: INITIAL ? ?4.  Patient to be able to stand to bake and decorate cakes vs. Having to sit on stool ?Baseline:  ?  Goal status: INITIAL ? ? ?PLAN: ?PT FREQUENCY: 1-2x/week ? ?PT DURATION: 8 weeks ? ?PLANNED INTERVENTIONS: Therapeutic exercises, Therapeutic activity, Neuromuscular re-education, Balance training, Gait training, Patient/Family education, Joint mobilization, Stair training, DME instructions, Aquatic Therapy, Dry Needling, Electrical stimulation, Cryotherapy, Moist heat, Splintting, Taping, Vasopneumatic device, Traction, Ultrasound, and Manual therapy ? ?PLAN FOR NEXT SESSION: Review HEP and add core stabilization  ? ? ?Anderson Malta B. Karl Knarr, PT ?12/18/2309:34 PM  ?

## 2021-12-24 ENCOUNTER — Encounter: Payer: Self-pay | Admitting: Physical Therapy

## 2021-12-24 ENCOUNTER — Ambulatory Visit: Payer: Medicare HMO | Admitting: Physical Therapy

## 2021-12-24 DIAGNOSIS — M25552 Pain in left hip: Secondary | ICD-10-CM | POA: Diagnosis not present

## 2021-12-24 DIAGNOSIS — M6281 Muscle weakness (generalized): Secondary | ICD-10-CM

## 2021-12-24 DIAGNOSIS — R262 Difficulty in walking, not elsewhere classified: Secondary | ICD-10-CM

## 2021-12-24 DIAGNOSIS — M79671 Pain in right foot: Secondary | ICD-10-CM | POA: Diagnosis not present

## 2021-12-24 NOTE — Therapy (Signed)
?OUTPATIENT PHYSICAL THERAPY TREATMENT NOTE ? ? ?Patient Name: Shelley Wright ?MRN: [id number] ?DOB:1946/05/21, 76 y.o., female ?Today's Date: 12/24/2021 ? ?PCP: Burnis Medin, MD ?REFERRING PROVIDER: Burnis Medin, MD ? ?END OF SESSION:  ? PT End of Session - 12/24/21 1531   ? ? Visit Number 2   ? Date for PT Re-Evaluation 02/12/22   ? Authorization Type [employer]   ? PT Start Time 0932   ? PT Stop Time 1530   ? PT Time Calculation (min) 44 min   ? Activity Tolerance Patient tolerated treatment well;No increased pain   ? Behavior During Therapy [facility] for tasks assessed/performed   ? ?  ?  ? ?  ? ? ?Past Medical History:  ?Diagnosis Date  ? Anxiety   ? no per pt  ? Arthritis   ? GERD (gastroesophageal reflux disease)   ? Heart murmur   ? 'YEARS AGO"  ? History of smoking   ? History of ulcerative colitis 2005  ? pancolitis   ? Hyperlipidemia   ? Hypertension   ? Normal nuclear stress test 2008  ? myoview   ? ?Past Surgical History:  ?Procedure Laterality Date  ? BREAST BIOPSY Left 1986  ? biopsy x 2   ? COLONOSCOPY    ? KNEE SURGERY Left october  ? x 2  ? ?Patient Active Problem List  ? Diagnosis Date Noted  ? Family history of cardiovascular disease 07/05/2012  ? Varicose veins 07/05/2012  ? Ex-smoker 11/14/2011  ? Myalgia 04/05/2011  ? History of ulcerative colitis   ? CHEST WALL PAIN, ANTERIOR 07/02/2009  ? ULCERATIVE COLITIS, HX OF 04/17/2008  ? HYPERLIPIDEMIA 04/15/2007  ? ANXIETY 04/15/2007  ? DEPRESSION 04/15/2007  ? HYPERTENSION 04/15/2007  ? ? ?  ?PCP: Burnis Medin, MD ?  ?REFERRING PROVIDER: Dene Gentry, MD ?  ?REFERRING DIAG: M25.552 (ICD-10-CM) - Left hip pain  ?  ?THERAPY DIAG:  ?Pain in left hip ?  ?Difficulty in walking, not elsewhere classified ?  ?Pain in right foot ?  ?Muscle weakness (generalized) ?  ?ONSET DATE: 11/13/21 ?  ?SUBJECTIVE:  ?  ?SUBJECTIVE STATEMENT: ?Pt states that things are going well. She has been working on her HEP. It is uncomfortable being on her side with some of  the exercises. No pain at the moment, just when being on her feet for long periods of time.  ?  ?PERTINENT HISTORY: ?Hx of left TKA ?  ?PAIN:  ?Are you having pain? Yes: Pain location: right foot / left hip ?Pain description: 10/10 in right foot at worst/ 10/10 at worst left hip ?Aggravating factors: walking  ?Relieving factors: nitro patches, rest, anti-inflammatory ?  ?PRECAUTIONS: None ?  ?WEIGHT BEARING RESTRICTIONS No ?  ?FALLS:  ?Has patient fallen in last 6 months? No ?  ?LIVING ENVIRONMENT: ?Lives with: lives with their spouse ?Lives in: House/apartment ?Stairs: Yes: Internal: 12 steps; can reach both and External: 5 steps; can reach both ?Has following equipment at home: None ?  ?OCCUPATION: retired just last year ?  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  ?PATIENT GOALS To be able to walk without pain and to have better balance ?  ?  ?OBJECTIVE:  ?  ?DIAGNOSTIC FINDINGS: Left hip : unremarkable  Right foot: unremarkable (diagnoses as tendinitis) ?  ?PATIENT SURVEYS:  ?FOTO 31 ?  ?COGNITION: ?  Overall cognitive status: Within functional limits for tasks assessed               ?           ?SENSATION: ?WFL ?  ?MUSCLE LENGTH: ?Hamstrings: Right 60 deg; Left 60 deg ?Thomas test: Positive bilaterally ?  ?POSTURE:  ?Normal with slight decreased lumbar lordosis ?  ?PALPATION: ?Tender and tight bilateral multifidi ?  ?LE Active ROM:  WFL ? ?Great toe extension: PROM Rt: 25 deg, Lt: 35 deg   ?  ?LE MMT:  generally 4 to 4+/5 ?  ?  ?LOWER EXTREMITY SPECIAL TESTS:  ?Hip special tests: Saralyn Pilar (FABER) test: positive , Trendelenburg test: negative, and Thomas test: positive  ?  ?FUNCTIONAL TESTS:  ?5 times sit to stand: 11 ?  ?GAIT: ?Distance walked: 50 ?Assistive device utilized: None ?Level of assistance: Complete Independence ?Comments: antalgic ?  ?  ?  ?TODAY'S TREATMENT: ? ?12/24/2021: ?Hooklying hip 90/90 IR/ER ?BLE bridge with  green TB around knees 2x5 reps, mild discomfort noted Lt buttock-resolved after finishing exercise ?Supine clamshell 2x10 reps green TB- Cuing to avoid Rt ankle inversion compensation ?Standing Lt hip mini dead lift with split stance to encourage glute activation ?Rt SLS with BUE support and Lt hip abduction PT cuing for tripod foot ?Seated Rt great toe extension stretch 4x10 sec hold  ?  ?  ?PATIENT EDUCATION:  ?Education details: Initiated HEP ?Person educated: Patient ?Education method: Explanation ?Education comprehension: verbalized understanding, returned demonstration, and verbal cues required ?  ?  ?HOME EXERCISE PROGRAM: ? ? Exercises ?Access Code: [email or web address] ?URL: [email or web address] ?Date: 12/24/2021 ?Prepared by: [facility] ? ?Exercises ?- Seated Figure 4 Piriformis Stretch  - 1 x daily - 7 x weekly - 3 sets - 10 reps ?- Supine ITB Stretch with Strap  - 1 x daily - 7 x weekly - 3 sets - 10 reps ?- Sidelying Hip Abduction  - 1 x daily - 7 x weekly - 3 sets - 10 reps ?- Prone Hip Extension  - 1 x daily - 7 x weekly - 3 sets - 10 reps ?- Sidelying Hip Adduction  - 1 x daily - 7 x weekly - 3 sets - 10 reps ?- Small Range Straight Leg Raise  - 1 x daily - 7 x weekly - 3 sets - 10 reps ?- Seated Self Great Toe Stretch  - 2 x daily - 7 x weekly - 5 reps - 10 sec hold ?  ?ASSESSMENT: ?  ?CLINICAL IMPRESSION: ?Patient arrives for her first treatment since the evaluation. She denies hip/foot pain and has been working on her HEP consistently. Session focused on hip strength bilaterally as well as introduction to Rt foot stability exercises. Static standing seems to cause temporary discomfort of the Rt foot and pt had difficulty with maintaining "tripod" foot contact with the floor. Pt has limited Rt great toe mobility and was provided a stretch for her HEP. Pt denied pain at the end of the session. Will continue to progress hip/foot strengthening and  mobility work as able without exacerbation of her pain.  ?  ?  ?OBJECTIVE IMPAIRMENTS Abnormal gait, decreased balance, decreased knowledge of condition, difficulty walking, decreased ROM, decreased strength, increased fascial restrictions, increased muscle spasms, impaired flexibility, improper body mechanics, postural dysfunction, and pain.  ?  ?ACTIVITY LIMITATIONS cleaning, community activity, driving, occupation, laundry, yard work, and shopping.  ?  ?PERSONAL FACTORS Age, Education, Fitness, Past/current experiences,  and Profession are also affecting patient's functional outcome.  ?  ?  ?REHAB POTENTIAL: Good ?  ?CLINICAL DECISION MAKING: Stable/uncomplicated ?  ?EVALUATION COMPLEXITY: Low ?  ?  ?GOALS: ?Goals reviewed with patient? Yes ?  ?SHORT TERM GOALS: Target date: 01/15/2022 ?  ?Patient will be independent with initial HEP  ?Baseline: ?Goal status: INITIAL ?  ?2.  Pain report to be no greater than 4/10  ?Baseline:  ?Goal status: INITIAL ?  ?3.  Patient to report 25% improvement in overall symptoms ?Baseline:  ?Goal status: INITIAL ?  ?  ?  ?LONG TERM GOALS: Target date: 02/12/2022 ?  ?Patient to be independent with advanced HEP  ?Baseline:  ?Goal status: INITIAL ?  ?2.  Patient to report pain no greater than 2/10  ?Baseline:  ?Goal status: INITIAL ?  ?3.  Patient to be able to walk 1 mile without hip pain ?Baseline:  ?Goal status: INITIAL ?  ?4.  Patient to be able to stand to bake and decorate cakes vs. Having to sit on stool ?Baseline:  ?Goal status: INITIAL ?  ?  ?PLAN: ?PT FREQUENCY: 1-2x/week ?  ?PT DURATION: 8 weeks ?  ?PLANNED INTERVENTIONS: Therapeutic exercises, Therapeutic activity, Neuromuscular re-education, Balance training, Gait training, Patient/Family education, Joint mobilization, Stair training, DME instructions, Aquatic Therapy, Dry Needling, Electrical stimulation, Cryotherapy, Moist heat, Splintting, Taping, Vasopneumatic device, Traction, Ultrasound, and Manual therapy ?  ?PLAN FOR  NEXT SESSION: progress ankle stability as able, glute strengthening progression, hip flexibility  ? ? ?3:40 PM,12/24/21 ?Sherol Dade PT, DPT ?[place] [facility] [place]  ?[place]

## 2021-12-26 NOTE — Therapy (Signed)
?OUTPATIENT PHYSICAL THERAPY TREATMENT NOTE ? ? ?Patient Name: Shelley Wright ?MRN: [id number] ?DOB:12-04-1945, 76 y.o., female ?Today's Date: 12/27/2021 ? ?PCP: Burnis Medin, MD ?REFERRING PROVIDER: Dene Gentry, MD ? ?END OF SESSION:  ? PT End of Session - 12/27/21 0932   ? ? Visit Number 3   ? Date for PT Re-Evaluation 02/12/22   ? Authorization Type [employer]   ? PT Start Time 0930   ? PT Stop Time 1015   ? PT Time Calculation (min) 45 min   ? Activity Tolerance Patient tolerated treatment well;No increased pain   ? Behavior During Therapy [facility] for tasks assessed/performed   ? ?  ?  ? ?  ? ? ?Past Medical History:  ?Diagnosis Date  ? Anxiety   ? no per pt  ? Arthritis   ? GERD (gastroesophageal reflux disease)   ? Heart murmur   ? 'YEARS AGO"  ? History of smoking   ? History of ulcerative colitis 2005  ? pancolitis   ? Hyperlipidemia   ? Hypertension   ? Normal nuclear stress test 2008  ? myoview   ? ?Past Surgical History:  ?Procedure Laterality Date  ? BREAST BIOPSY Left 1986  ? biopsy x 2   ? COLONOSCOPY    ? KNEE SURGERY Left october  ? x 2  ? ?Patient Active Problem List  ? Diagnosis Date Noted  ? Family history of cardiovascular disease 07/05/2012  ? Varicose veins 07/05/2012  ? Ex-smoker 11/14/2011  ? Myalgia 04/05/2011  ? History of ulcerative colitis   ? CHEST WALL PAIN, ANTERIOR 07/02/2009  ? ULCERATIVE COLITIS, HX OF 04/17/2008  ? HYPERLIPIDEMIA 04/15/2007  ? ANXIETY 04/15/2007  ? DEPRESSION 04/15/2007  ? HYPERTENSION 04/15/2007  ? ?  ?PCP: Burnis Medin, MD ?  ?REFERRING PROVIDER: Dene Gentry, MD ?  ?REFERRING DIAG: M25.552 (ICD-10-CM) - Left hip pain  ?  ?THERAPY DIAG:  ?Pain in left hip ?  ?Difficulty in walking, not elsewhere classified ?  ?Pain in right foot ?  ?Muscle weakness (generalized) ?  ?ONSET DATE: 11/13/21 ?  ?SUBJECTIVE:  ?  ?SUBJECTIVE STATEMENT: ?Pt states that her foot was sore following her last session. It is doing better today.  ?  ?PERTINENT HISTORY: ?Hx of left  TKA ?  ?PAIN:  ?Are you having pain? Yes: Pain location: 7/10 right foot; left hip 5/10 ?Pain description: 10/10 in right foot at worst/ 10/10 at worst left hip ?Aggravating factors: walking  ?Relieving factors: nitro patches, rest, anti-inflammatory ?  ?PRECAUTIONS: None ?  ?WEIGHT BEARING RESTRICTIONS No ?  ?FALLS:  ?Has patient fallen in last 6 months? No ?  ?LIVING ENVIRONMENT: ?Lives with: lives with their spouse ?Lives in: House/apartment ?Stairs: Yes: Internal: 12 steps; can reach both and External: 5 steps; can reach both ?Has following equipment at home: None ?  ?OCCUPATION: retired just last year ?  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  ?PATIENT GOALS To be able to walk without pain and to have better balance ?  ?  ?OBJECTIVE:  ?  ?DIAGNOSTIC FINDINGS: Left hip : unremarkable  Right foot: unremarkable (diagnoses as tendinitis) ?  ?PATIENT SURVEYS:  ?FOTO 68 ?  ?COGNITION: ?          Overall cognitive status: Within functional limits for tasks assessed               ?           ?  SENSATION: ?WFL ?  ?MUSCLE LENGTH: ?Hamstrings: Right 60 deg; Left 60 deg ?Thomas test: Positive bilaterally ?  ?POSTURE:  ?Normal with slight decreased lumbar lordosis ?  ?PALPATION: ?Tender and tight bilateral multifidi ?  ?LE Active ROM:  WFL ?  ?Great toe extension: PROM Rt: 25 deg, Lt: 35 deg   ?  ?LE MMT:  generally 4 to 4+/5 ?  ?  ?LOWER EXTREMITY SPECIAL TESTS:  ?Hip special tests: Saralyn Pilar (FABER) test: positive , Trendelenburg test: negative, and Thomas test: positive  ?  ?FUNCTIONAL TESTS:  ?5 times sit to stand: 11 ?  ?GAIT: ?Distance walked: 50 ?Assistive device utilized: None ?Level of assistance: Complete Independence ?Comments: antalgic ?  ?  ?  ?TODAY'S TREATMENT: ?12/27/21: ?Long sitting Rt ankle PF/great toe flexion with yellow TB 2x10 reps  ?Long sitting Rt ankle inversion with yellow TB 2x10 reps  ?Attempted to correct posture with Rt  sidelying hip abduction, but pt continues to compensate with hip flexion.   ?Seated heel raises holding #25 weight plate on thighs 0K93 reps ?Standing Rt hip abduction toe tap/hold 5x5 sec with PT cuing Lt glute med activation and keeping level pelvis ?MANUAL: STM Rt gastroc region ? ?12/24/2021: ?Hooklying hip 90/90 IR/ER ?BLE bridge with green TB around knees 2x5 reps, mild discomfort noted Lt buttock-resolved after finishing exercise ?Supine clamshell 2x10 reps green TB- Cuing to avoid Rt ankle inversion compensation ?Standing Lt hip mini dead lift with split stance to encourage glute activation ?Rt SLS with BUE support and Lt hip abduction PT cuing for tripod foot ?Seated Rt great toe extension stretch 4x10 sec hold  ?  ?  ?PATIENT EDUCATION:  ?Education details: updates to HEP; technique with therex ?Person educated: Patient ?Education method: Explanation; verbal cues  ?Education comprehension: verbalized understanding, returned demonstration, and verbal cues required ?  ?  ?HOME EXERCISE PROGRAM: ?  ?Access Code: [email or web address] ?URL: [email or web address] ?Date: 12/27/2021 ?Prepared by: [facility] ? ?Exercises ?- Seated Figure 4 Piriformis Stretch  - 1 x daily - 7 x weekly - 3 sets - 10 reps ?- Prone Hip Extension  - 1 x daily - 7 x weekly - 3 sets - 10 reps ?- Sidelying Hip Adduction  - 1 x daily - 7 x weekly - 3 sets - 10 reps ?- Small Range Straight Leg Raise  - 1 x daily - 7 x weekly - 3 sets - 10 reps ?- Seated Self Great Toe Stretch  - 2 x daily - 7 x weekly - 5 reps - 10 sec hold ?- Ankle and Toe Plantarflexion with Resistance  - 1 x daily - 5 x weekly - 2 sets - 10 reps ?- Standing Hip Abduction with Counter Support  - 1 x daily - 7 x weekly - 3 sets - 10 reps ?  ?ASSESSMENT: ?  ?CLINICAL IMPRESSION: ?Pt had some increase in Rt foot pain following last session, but it is returning back to her baseline today. Session focused on finding foot/ankle  exercises to improve ankle stability and posterior tib strength. Pt denied pain with all exercises today. PT reviewed sidelying hip abduction and had to replace this secondary to her being unable to perform properly. Pt demonstrated good understanding of all of today's HEP updates. ?  ?  ?OBJECTIVE IMPAIRMENTS Abnormal gait, decreased balance, decreased knowledge of condition, difficulty walking, decreased ROM, decreased strength, increased fascial restrictions, increased muscle spasms, impaired flexibility, improper body mechanics, postural dysfunction, and pain.  ?  ?  ACTIVITY LIMITATIONS cleaning, community activity, driving, occupation, Medical sales representative, yard work, and shopping.  ?  ?PERSONAL FACTORS Age, Education, Fitness, Past/current experiences, and Profession are also affecting patient's functional outcome.  ?  ?  ?REHAB POTENTIAL: Good ?  ?CLINICAL DECISION MAKING: Stable/uncomplicated ?  ?EVALUATION COMPLEXITY: Low ?  ?  ?GOALS: ?Goals reviewed with patient? Yes ?  ?SHORT TERM GOALS: Target date: 01/15/2022 ?  ?Patient will be independent with initial HEP  ?Baseline: ?Goal status: INITIAL ?  ?2.  Pain report to be no greater than 4/10  ?Baseline:  ?Goal status: INITIAL ?  ?3.  Patient to report 25% improvement in overall symptoms ?Baseline:  ?Goal status: INITIAL ?  ?  ?  ?LONG TERM GOALS: Target date: 02/12/2022 ?  ?Patient to be independent with advanced HEP  ?Baseline:  ?Goal status: INITIAL ?  ?2.  Patient to report pain no greater than 2/10  ?Baseline:  ?Goal status: INITIAL ?  ?3.  Patient to be able to walk 1 mile without hip pain ?Baseline:  ?Goal status: INITIAL ?  ?4.  Patient to be able to stand to bake and decorate cakes vs. Having to sit on stool ?Baseline:  ?Goal status: INITIAL ?  ?  ?PLAN: ?PT FREQUENCY: 1-2x/week ?  ?PT DURATION: 8 weeks ?  ?PLANNED INTERVENTIONS: Therapeutic exercises, Therapeutic activity, Neuromuscular re-education, Balance training, Gait training, Patient/Family education, Joint  mobilization, Stair training, DME instructions, Aquatic Therapy, Dry Needling, Electrical stimulation, Cryotherapy, Moist heat, Splintting, Taping, Vasopneumatic device, Traction, Ultrasound, and Manual

## 2021-12-27 ENCOUNTER — Ambulatory Visit: Payer: Medicare HMO

## 2021-12-27 ENCOUNTER — Ambulatory Visit: Payer: Medicare HMO | Admitting: Physical Therapy

## 2021-12-27 ENCOUNTER — Encounter: Payer: Self-pay | Admitting: Physical Therapy

## 2021-12-27 DIAGNOSIS — M25552 Pain in left hip: Secondary | ICD-10-CM | POA: Diagnosis not present

## 2021-12-27 DIAGNOSIS — R262 Difficulty in walking, not elsewhere classified: Secondary | ICD-10-CM

## 2021-12-27 DIAGNOSIS — M6281 Muscle weakness (generalized): Secondary | ICD-10-CM

## 2021-12-27 DIAGNOSIS — M79671 Pain in right foot: Secondary | ICD-10-CM

## 2021-12-30 ENCOUNTER — Ambulatory Visit: Payer: Medicare HMO

## 2021-12-30 DIAGNOSIS — R293 Abnormal posture: Secondary | ICD-10-CM

## 2021-12-30 DIAGNOSIS — M79671 Pain in right foot: Secondary | ICD-10-CM | POA: Diagnosis not present

## 2021-12-30 DIAGNOSIS — R252 Cramp and spasm: Secondary | ICD-10-CM

## 2021-12-30 DIAGNOSIS — M25552 Pain in left hip: Secondary | ICD-10-CM | POA: Diagnosis not present

## 2021-12-30 DIAGNOSIS — M6281 Muscle weakness (generalized): Secondary | ICD-10-CM | POA: Diagnosis not present

## 2021-12-30 DIAGNOSIS — R262 Difficulty in walking, not elsewhere classified: Secondary | ICD-10-CM | POA: Diagnosis not present

## 2021-12-30 NOTE — Therapy (Signed)
?OUTPATIENT PHYSICAL THERAPY TREATMENT NOTE ? ? ?Patient Name: Shelley Wright ?MRN: [id number] ?DOB:08-03-1946, 76 y.o., female ?Today's Date: 12/30/2021 ? ?PCP: Burnis Medin, MD ?REFERRING PROVIDER: Dene Gentry, MD ? ?END OF SESSION:  ? PT End of Session - 12/30/21 6004   ? ? Visit Number 4   ? Date for PT Re-Evaluation 02/12/22   ? Authorization Type [employer]   ? PT Start Time 0930   ? PT Stop Time 1013   ? PT Time Calculation (min) 43 min   ? Activity Tolerance Patient tolerated treatment well;No increased pain   ? Behavior During Therapy [facility] for tasks assessed/performed   ? ?  ?  ? ?  ? ? ? ?Past Medical History:  ?Diagnosis Date  ? Anxiety   ? no per pt  ? Arthritis   ? GERD (gastroesophageal reflux disease)   ? Heart murmur   ? 'YEARS AGO"  ? History of smoking   ? History of ulcerative colitis 2005  ? pancolitis   ? Hyperlipidemia   ? Hypertension   ? Normal nuclear stress test 2008  ? myoview   ? ?Past Surgical History:  ?Procedure Laterality Date  ? BREAST BIOPSY Left 1986  ? biopsy x 2   ? COLONOSCOPY    ? KNEE SURGERY Left october  ? x 2  ? ?Patient Active Problem List  ? Diagnosis Date Noted  ? Family history of cardiovascular disease 07/05/2012  ? Varicose veins 07/05/2012  ? Ex-smoker 11/14/2011  ? Myalgia 04/05/2011  ? History of ulcerative colitis   ? CHEST WALL PAIN, ANTERIOR 07/02/2009  ? ULCERATIVE COLITIS, HX OF 04/17/2008  ? HYPERLIPIDEMIA 04/15/2007  ? ANXIETY 04/15/2007  ? DEPRESSION 04/15/2007  ? HYPERTENSION 04/15/2007  ? ?  ?PCP: Burnis Medin, MD ?  ?REFERRING PROVIDER: Dene Gentry, MD ?  ?REFERRING DIAG: M25.552 (ICD-10-CM) - Left hip pain  ?  ?THERAPY DIAG:  ?Pain in left hip ?  ?Difficulty in walking, not elsewhere classified ?  ?Pain in right foot ?  ?Muscle weakness (generalized) ?  ?ONSET DATE: 11/13/21 ?  ?SUBJECTIVE:  ?  ?SUBJECTIVE STATEMENT: ?Pt states that her right foot pain is 3/10 and left hip is 5/10.  My new shoes have helped with the right foot pain.   ?   ?PERTINENT HISTORY: ?Hx of left TKA ?  ?PAIN:  ?Are you having pain? Yes: Pain location: 3/10 right foot; left hip 5/10 ?Pain description: 10/10 in right foot at worst/ 10/10 at worst left hip ?Aggravating factors: walking  ?Relieving factors: nitro patches, rest, anti-inflammatory ?  ?PRECAUTIONS: None ?  ?WEIGHT BEARING RESTRICTIONS No ?  ?FALLS:  ?Has patient fallen in last 6 months? No ?  ?LIVING ENVIRONMENT: ?Lives with: lives with their spouse ?Lives in: House/apartment ?Stairs: Yes: Internal: 12 steps; can reach both and External: 5 steps; can reach both ?Has following equipment at home: None ?  ?OCCUPATION: retired just last year ?  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  ?PATIENT GOALS To be able to walk without pain and to have better balance ?  ?  ?OBJECTIVE:  ?  ?DIAGNOSTIC FINDINGS: Left hip : unremarkable  Right foot: unremarkable (diagnoses as tendinitis) ?  ?PATIENT SURVEYS:  ?FOTO 79 ?  ?COGNITION: ?          Overall cognitive status: Within functional limits for  tasks assessed               ?           ?SENSATION: ?WFL ?  ?MUSCLE LENGTH: ?Hamstrings: Right 60 deg; Left 60 deg ?Thomas test: Positive bilaterally ?  ?POSTURE:  ?Normal with slight decreased lumbar lordosis ?  ?PALPATION: ?Tender and tight bilateral multifidi ?  ?LE Active ROM:  WFL ?  ?Great toe extension: PROM Rt: 25 deg, Lt: 35 deg   ?  ?LE MMT:  generally 4 to 4+/5 ?  ?  ?LOWER EXTREMITY SPECIAL TESTS:  ?Hip special tests: Saralyn Pilar (FABER) test: positive , Trendelenburg test: negative, and Thomas test: positive  ?  ?FUNCTIONAL TESTS:  ?5 times sit to stand: 11 ?  ?GAIT: ?Distance walked: 50 ?Assistive device utilized: None ?Level of assistance: Complete Independence ?Comments: antalgic ?  ?  ?  ?TODAY'S TREATMENT: ?12/30/21: ?NuStep x 5 min level 2 (PT present to discuss progress toward goals) ?LE band walks with green loop x 3 laps of 10 steps each  way ?Standing left hip abduction (no weight) 2 x 10 ?Standing hip extension (no weight) 2 x 10 ?Standing hip flexor/quad stretch (both) 3 x 30 sec ?Prostretch x 5 hold 10 sec right (5 each gastroc and soleus) ?Supine hamstring stretc (left) 5 x 10 sec ?Supine ITB/lateral hip stretch (left) 5 x 10 sec ?Supine piriformis stretch x 5 hold 10 sec each ?Seated butterfly stretch x 5 hold 10 sec each ?Long sitting Rt ankle PF/great toe flexion with yellow TB 2x10 reps  ?Long sitting Rt ankle inversion with yellow TB 2x10 reps  ?Attempted to correct posture with Rt sidelying hip abduction, but pt continues to compensate with hip flexion.   ?Seated heel raises holding #25 weight plate on thighs 4M01 reps ?Standing Rt hip abduction toe tap/hold 5x5 sec with PT cuing Lt glute med activation and keeping level pelvis ?MANUAL: STM Rt gastroc region ? ?12/27/21: ?Long sitting Rt ankle PF/great toe flexion with yellow TB 2x10 reps  ?Long sitting Rt ankle inversion with yellow TB 2x10 reps  ?Attempted to correct posture with Rt sidelying hip abduction, but pt continues to compensate with hip flexion.   ?Seated heel raises holding #25 weight plate on thighs 0U72 reps ?Standing Rt hip abduction toe tap/hold 5x5 sec with PT cuing Lt glute med activation and keeping level pelvis ?MANUAL: STM Rt gastroc region ? ?12/24/2021: ?Hooklying hip 90/90 IR/ER ?BLE bridge with green TB around knees 2x5 reps, mild discomfort noted Lt buttock-resolved after finishing exercise ?Supine clamshell 2x10 reps green TB- Cuing to avoid Rt ankle inversion compensation ?Standing Lt hip mini dead lift with split stance to encourage glute activation ?Rt SLS with BUE support and Lt hip abduction PT cuing for tripod foot ?Seated Rt great toe extension stretch 4x10 sec hold  ?  ?  ?PATIENT EDUCATION:  ?Education details: updates to HEP; technique with therex ?Person educated: Patient ?Education method: Explanation; verbal cues  ?Education comprehension: verbalized  understanding, returned demonstration, and verbal cues required ?  ?  ?HOME EXERCISE PROGRAM: ?  ?Access Code: [email or web address] ?URL: [email or web address] ?Date: 12/27/2021 ?Prepared by: [facility] ? ?Exercises ?- Seated Figure 4 Piriformis Stretch  - 1 x daily - 7 x weekly - 3 sets - 10 reps ?- Prone Hip Extension  - 1 x daily - 7 x weekly - 3 sets - 10 reps ?- Sidelying Hip Adduction  - 1 x daily -  7 x weekly - 3 sets - 10 reps ?- Small Range Straight Leg Raise  - 1 x daily - 7 x weekly - 3 sets - 10 reps ?- Seated Self Great Toe Stretch  - 2 x daily - 7 x weekly - 5 reps - 10 sec hold ?- Ankle and Toe Plantarflexion with Resistance  - 1 x daily - 5 x weekly - 2 sets - 10 reps ?- Standing Hip Abduction with Counter Support  - 1 x daily - 7 x weekly - 3 sets - 10 reps ?  ?ASSESSMENT: ?  ?CLINICAL IMPRESSION: ?Pt was able to do all tasks today without increased pain.  She is having less foot pain with her new shoes.  Hip pain is improving.  She would benefit from continued skilled PT for hip strengthening, flexibility training and quad rehab to progress toward final goals.   ?  ?  ?OBJECTIVE IMPAIRMENTS Abnormal gait, decreased balance, decreased knowledge of condition, difficulty walking, decreased ROM, decreased strength, increased fascial restrictions, increased muscle spasms, impaired flexibility, improper body mechanics, postural dysfunction, and pain.  ?  ?ACTIVITY LIMITATIONS cleaning, community activity, driving, occupation, laundry, yard work, and shopping.  ?  ?PERSONAL FACTORS Age, Education, Fitness, Past/current experiences, and Profession are also affecting patient's functional outcome.  ?  ?  ?REHAB POTENTIAL: Good ?  ?CLINICAL DECISION MAKING: Stable/uncomplicated ?  ?EVALUATION COMPLEXITY: Low ?  ?  ?GOALS: ?Goals reviewed with patient? Yes ?  ?SHORT TERM GOALS: Target date: 01/15/2022 ?  ?Patient will be independent with initial HEP   ?Baseline: ?Goal status: INITIAL ?  ?2.  Pain report to be no greater than 4/10  ?Baseline:  ?Goal status: INITIAL ?  ?3.  Patient to report 25% improvement in overall symptoms ?Baseline:  ?Goal status: INITIAL ?  ?  ?

## 2022-01-01 ENCOUNTER — Ambulatory Visit: Payer: Medicare HMO

## 2022-01-01 DIAGNOSIS — R262 Difficulty in walking, not elsewhere classified: Secondary | ICD-10-CM | POA: Diagnosis not present

## 2022-01-01 DIAGNOSIS — M25552 Pain in left hip: Secondary | ICD-10-CM

## 2022-01-01 DIAGNOSIS — R252 Cramp and spasm: Secondary | ICD-10-CM

## 2022-01-01 DIAGNOSIS — R293 Abnormal posture: Secondary | ICD-10-CM

## 2022-01-01 DIAGNOSIS — M79671 Pain in right foot: Secondary | ICD-10-CM

## 2022-01-01 DIAGNOSIS — M6281 Muscle weakness (generalized): Secondary | ICD-10-CM | POA: Diagnosis not present

## 2022-01-01 NOTE — Therapy (Signed)
?OUTPATIENT PHYSICAL THERAPY TREATMENT NOTE ? ? ?Patient Name: Shelley Wright ?MRN: [id number] ?DOB:Jul 07, 1946, 76 y.o., female ?Today's Date: 01/01/2022 ? ?PCP: Burnis Medin, MD ?REFERRING PROVIDER: Dene Gentry, MD ? ?END OF SESSION:  ? PT End of Session - 01/01/22 1153   ? ? Visit Number 5   ? Number of Visits 12   ? Date for PT Re-Evaluation 02/12/22   ? Authorization Type [employer]   ? Authorization Time Period [place] approved 12 PT visits 12/18/2021-02/12/2022  Authorization #[id number]   ? Authorization - Visit Number 5   ? Authorization - Number of Visits 12   ? Progress Note Due on Visit 10   ? PT Start Time 1147   ? PT Stop Time 1230   ? PT Time Calculation (min) 43 min   ? Activity Tolerance Patient tolerated treatment well;No increased pain   ? Behavior During Therapy [facility] for tasks assessed/performed   ? ?  ?  ? ?  ? ? ? ?Past Medical History:  ?Diagnosis Date  ? Anxiety   ? no per pt  ? Arthritis   ? GERD (gastroesophageal reflux disease)   ? Heart murmur   ? 'YEARS AGO"  ? History of smoking   ? History of ulcerative colitis 2005  ? pancolitis   ? Hyperlipidemia   ? Hypertension   ? Normal nuclear stress test 2008  ? myoview   ? ?Past Surgical History:  ?Procedure Laterality Date  ? BREAST BIOPSY Left 1986  ? biopsy x 2   ? COLONOSCOPY    ? KNEE SURGERY Left october  ? x 2  ? ?Patient Active Problem List  ? Diagnosis Date Noted  ? Family history of cardiovascular disease 07/05/2012  ? Varicose veins 07/05/2012  ? Ex-smoker 11/14/2011  ? Myalgia 04/05/2011  ? History of ulcerative colitis   ? CHEST WALL PAIN, ANTERIOR 07/02/2009  ? ULCERATIVE COLITIS, HX OF 04/17/2008  ? HYPERLIPIDEMIA 04/15/2007  ? ANXIETY 04/15/2007  ? DEPRESSION 04/15/2007  ? HYPERTENSION 04/15/2007  ? ?  ?PCP: Burnis Medin, MD ?  ?REFERRING PROVIDER: Dene Gentry, MD ?  ?REFERRING DIAG: M25.552 (ICD-10-CM) - Left hip pain  ?  ?THERAPY DIAG:  ?Pain in left hip ?  ?Difficulty in walking, not elsewhere classified ?   ?Pain in right foot ?  ?Muscle weakness (generalized) ?  ?ONSET DATE: 11/13/21 ?  ?SUBJECTIVE:  ?  ?SUBJECTIVE STATEMENT: ?Pt states that her right foot pain is 3/10 and left hip is 4/10.  I really am uncomfortable when I first get up in the morning.  Once I get going, I'm better.  I went to a soccer game the other day and got pretty stiff after sitting on the bleachers and had a hard time walking back to my car.   ?  ?PERTINENT HISTORY: ?Hx of left TKA ?  ?PAIN:  ?Are you having pain? Yes: Pain location: 3/10 right foot; left hip 5/10 ?Pain description: 10/10 in right foot at worst/ 10/10 at worst left hip ?Aggravating factors: walking  ?Relieving factors: nitro patches, rest, anti-inflammatory ?  ?PRECAUTIONS: None ?  ?WEIGHT BEARING RESTRICTIONS No ?  ?FALLS:  ?Has patient fallen in last 6 months? No ?  ?LIVING ENVIRONMENT: ?Lives with: lives with their spouse ?Lives in: House/apartment ?Stairs: Yes: Internal: 12 steps; can reach both and External: 5 steps; can reach both ?Has following equipment at home: None ?  ?OCCUPATION: retired just last year ?  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  ?PATIENT GOALS To be able to walk without pain and to have better balance ?  ?  ?OBJECTIVE:  ?  ?DIAGNOSTIC FINDINGS: Left hip : unremarkable  Right foot: unremarkable (diagnoses as tendinitis) ?  ?PATIENT SURVEYS:  ?FOTO 37 ?  ?COGNITION: ?          Overall cognitive status: Within functional limits for tasks assessed               ?           ?SENSATION: ?WFL ?  ?MUSCLE LENGTH: ?Hamstrings: Right 60 deg; Left 60 deg ?Thomas test: Positive bilaterally ?  ?POSTURE:  ?Normal with slight decreased lumbar lordosis ?  ?PALPATION: ?Tender and tight bilateral multifidi ?  ?LE Active ROM:  WFL ?  ?Great toe extension: PROM Rt: 25 deg, Lt: 35 deg   ?  ?LE MMT:  generally 4 to 4+/5 ?  ?  ?LOWER EXTREMITY SPECIAL TESTS:  ?Hip special tests: Saralyn Pilar (FABER)  test: positive , Trendelenburg test: negative, and Thomas test: positive  ?  ?FUNCTIONAL TESTS:  ?5 times sit to stand: 11 ?  ?GAIT: ?Distance walked: 50 ?Assistive device utilized: None ?Level of assistance: Complete Independence ?Comments: antalgic ?  ?  ?  ?TODAY'S TREATMENT: ?01/01/22: ?NuStep x 5 min level 2 (PT present to discuss progress toward goals) ?LE band walks with green loop x 3 laps of 10 steps each way ?Cone touches 3 x 10 each LE (cone on table and standing on floor) ?1D plyo ball toss (red) 3 x 10 each LE ?Supine clam with green loop x 20 ?Supine bridge x 20 ?Sidelying clam with green loop x 20 ?Sidelying hip abduction x 20 (eccentric lowering) ?Supine ITB/lateral hip stretch (left) 5 x 10 sec ?Supine hip adductor stretch with strap 5 x 10 sec ?Supine piriformis stretch x 5 hold 10 sec each ?Seated butterfly stretch x 5 hold 10 sec each ?Ice to left hip x 10 min ?12/30/21: ?NuStep x 5 min level 2 (PT present to discuss progress toward goals) ?LE band walks with green loop x 3 laps of 10 steps each way ?Standing left hip abduction (no weight) 2 x 10 ?Standing hip extension (no weight) 2 x 10 ?Standing hip flexor/quad stretch (both) 3 x 30 sec ?Prostretch x 5 hold 10 sec right (5 each gastroc and soleus) ?Supine hamstring stretc (left) 5 x 10 sec ?Supine ITB/lateral hip stretch (left) 5 x 10 sec ?Supine piriformis stretch x 5 hold 10 sec each ?Seated butterfly stretch x 5 hold 10 sec each ?Long sitting Rt ankle PF/great toe flexion with yellow TB 2x10 reps  ?Long sitting Rt ankle inversion with yellow TB 2x10 reps  ?Attempted to correct posture with Rt sidelying hip abduction, but pt continues to compensate with hip flexion.   ?Seated heel raises holding #25 weight plate on thighs 1E56 reps ?Standing Rt hip abduction toe tap/hold 5x5 sec with PT cuing Lt glute med activation and keeping level pelvis ?MANUAL: STM Rt gastroc region ? ?12/27/21: ?Long sitting Rt ankle PF/great toe flexion with yellow TB  2x10 reps  ?Long sitting Rt ankle inversion with yellow TB 2x10 reps  ?Attempted to correct posture with Rt sidelying hip abduction, but pt continues to compensate with hip flexion.   ?Seated heel raises holding #25 weight plate on thighs 3J49 reps ?Standing Rt hip  abduction toe tap/hold 5x5 sec with PT cuing Lt glute med activation and keeping level pelvis ?MANUAL: STM Rt gastroc region ? ?  ?  ?PATIENT EDUCATION:  ?Education details: discussed postural considerations and her tendency to maintain bent knee posture and trunk deviation.  Suggested doing frequent posture checks when she passes a mirror to try and be more aware of these deviations and to attempt to correct these.   ?Person educated: Patient ?Education method: Explanation; verbal cues  ?Education comprehension: verbalized understanding, returned demonstration, and verbal cues required ?  ?  ?HOME EXERCISE PROGRAM: ?  ?Access Code: [email or web address] ?URL: [email or web address] ?Date: 12/27/2021 ?Prepared by: [facility] ? ?Exercises ?- Seated Figure 4 Piriformis Stretch  - 1 x daily - 7 x weekly - 3 sets - 10 reps ?- Prone Hip Extension  - 1 x daily - 7 x weekly - 3 sets - 10 reps ?- Sidelying Hip Adduction  - 1 x daily - 7 x weekly - 3 sets - 10 reps ?- Small Range Straight Leg Raise  - 1 x daily - 7 x weekly - 3 sets - 10 reps ?- Seated Self Great Toe Stretch  - 2 x daily - 7 x weekly - 5 reps - 10 sec hold ?- Ankle and Toe Plantarflexion with Resistance  - 1 x daily - 5 x weekly - 2 sets - 10 reps ?- Standing Hip Abduction with Counter Support  - 1 x daily - 7 x weekly - 3 sets - 10 reps ?  ?ASSESSMENT: ?  ?CLINICAL IMPRESSION: ?Pt was able to tolerate addition of dynamic balance training and hip strengthening.  She continues to have postural issues and extremely weak hip ER's and hip abductors that likely contribute to her issues.  She would benefit from continued skilled PT for hip strengthening,  flexibility training and quad rehab to progress toward final goals.   ?  ?  ?OBJECTIVE IMPAIRMENTS Abnormal gait, decreased balance, decreased knowledge of condition, difficulty walking, decreased ROM, decrease

## 2022-01-08 ENCOUNTER — Ambulatory Visit: Payer: Medicare HMO

## 2022-01-08 DIAGNOSIS — M79671 Pain in right foot: Secondary | ICD-10-CM

## 2022-01-08 DIAGNOSIS — R252 Cramp and spasm: Secondary | ICD-10-CM

## 2022-01-08 DIAGNOSIS — R262 Difficulty in walking, not elsewhere classified: Secondary | ICD-10-CM

## 2022-01-08 DIAGNOSIS — M6281 Muscle weakness (generalized): Secondary | ICD-10-CM

## 2022-01-08 DIAGNOSIS — M25552 Pain in left hip: Secondary | ICD-10-CM | POA: Diagnosis not present

## 2022-01-08 DIAGNOSIS — R293 Abnormal posture: Secondary | ICD-10-CM

## 2022-01-08 NOTE — Therapy (Signed)
?OUTPATIENT PHYSICAL THERAPY TREATMENT NOTE ? ? ?Patient Name: Shelley Wright ?MRN: [id number] ?DOB:10-Feb-1946, 76 y.o., female ?Today's Date: 01/08/2022 ? ?PCP: Burnis Medin, MD ?REFERRING PROVIDER: Dene Gentry, MD ? ?END OF SESSION:  ? PT End of Session - 01/08/22 1227   ? ? Visit Number 6   ? Number of Visits 12   ? Date for PT Re-Evaluation 02/12/22   ? Authorization Type [employer]   ? Authorization Time Period Cohere approved 12 PT visits 12/18/2021-02/12/2022  Authorization #[id number]   ? Authorization - Visit Number 6   ? Authorization - Number of Visits 12   ? Progress Note Due on Visit 10   ? PT Start Time 1222   ? PT Stop Time 1318   ? PT Time Calculation (min) 56 min   ? Activity Tolerance Patient tolerated treatment well;No increased pain   ? Behavior During Therapy [facility] for tasks assessed/performed   ? ?  ?  ? ?  ? ? ? ?Past Medical History:  ?Diagnosis Date  ? Anxiety   ? no per pt  ? Arthritis   ? GERD (gastroesophageal reflux disease)   ? Heart murmur   ? 'YEARS AGO"  ? History of smoking   ? History of ulcerative colitis 2005  ? pancolitis   ? Hyperlipidemia   ? Hypertension   ? Normal nuclear stress test 2008  ? myoview   ? ?Past Surgical History:  ?Procedure Laterality Date  ? BREAST BIOPSY Left 1986  ? biopsy x 2   ? COLONOSCOPY    ? KNEE SURGERY Left october  ? x 2  ? ?Patient Active Problem List  ? Diagnosis Date Noted  ? Family history of cardiovascular disease 07/05/2012  ? Varicose veins 07/05/2012  ? Ex-smoker 11/14/2011  ? Myalgia 04/05/2011  ? History of ulcerative colitis   ? CHEST WALL PAIN, ANTERIOR 07/02/2009  ? ULCERATIVE COLITIS, HX OF 04/17/2008  ? HYPERLIPIDEMIA 04/15/2007  ? ANXIETY 04/15/2007  ? DEPRESSION 04/15/2007  ? HYPERTENSION 04/15/2007  ? ?  ?PCP: Burnis Medin, MD ?  ?REFERRING PROVIDER: Dene Gentry, MD ?  ?REFERRING DIAG: M25.552 (ICD-10-CM) - Left hip pain  ?  ?THERAPY DIAG:  ?Pain in left hip ?  ?Difficulty in walking, not elsewhere classified ?   ?Pain in right foot ?  ?Muscle weakness (generalized) ?  ?ONSET DATE: 11/13/21 ?  ?SUBJECTIVE:  ?  ?SUBJECTIVE STATEMENT: ?Pt states she is doing "great!" ?  ?PERTINENT HISTORY: ?Hx of left TKA ?  ?PAIN:  ?Are you having pain? Yes: Pain location: 3/10 right foot; left hip 5/10 ?Pain description: 10/10 in right foot at worst/ 10/10 at worst left hip ?Aggravating factors: walking  ?Relieving factors: nitro patches, rest, anti-inflammatory ?  ?PRECAUTIONS: None ?  ?WEIGHT BEARING RESTRICTIONS No ?  ?FALLS:  ?Has patient fallen in last 6 months? No ?  ?LIVING ENVIRONMENT: ?Lives with: lives with their spouse ?Lives in: House/apartment ?Stairs: Yes: Internal: 12 steps; can reach both and External: 5 steps; can reach both ?Has following equipment at home: None ?  ?OCCUPATION: retired just last year ?  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  ?PATIENT GOALS To be able to walk without pain and to have better balance ?  ?  ?OBJECTIVE:  ?  ?DIAGNOSTIC FINDINGS: Left hip : unremarkable  Right foot: unremarkable (diagnoses as tendinitis) ?  ?  PATIENT SURVEYS:  ?FOTO 60 ?  ?COGNITION: ?          Overall cognitive status: Within functional limits for tasks assessed               ?           ?SENSATION: ?WFL ?  ?MUSCLE LENGTH: ?Hamstrings: Right 60 deg; Left 60 deg ?Thomas test: Positive bilaterally ?  ?POSTURE:  ?Normal with slight decreased lumbar lordosis ?  ?PALPATION: ?Tender and tight bilateral multifidi ?  ?LE Active ROM:  WFL ?  ?Great toe extension: PROM Rt: 25 deg, Lt: 35 deg   ?  ?LE MMT:  generally 4 to 4+/5 ?  ?  ?LOWER EXTREMITY SPECIAL TESTS:  ?Hip special tests: Saralyn Pilar (FABER) test: positive , Trendelenburg test: negative, and Thomas test: positive  ?  ?FUNCTIONAL TESTS:  ?5 times sit to stand: 11 ?  ?GAIT: ?Distance walked: 50 ?Assistive device utilized: None ?Level of assistance: Complete Independence ?Comments: antalgic ?  ?  ?   ?TODAY'S TREATMENT: ?01/07/22: ?NuStep x 5 min level 2 (PT present to discuss progress toward goals) ?Step ups with opposite LE hip extension x 10 each ?LE band walks with green loop x 3 laps of 10 steps each way ?Educated patient on posture and lack of lumbar lordosi ?Cone touches 3 x 10 each LE (cone on chair and standing on floor) ?BOSU lunges x 10 ea LE fwd and lateral ?1D plyo ball toss (red) 3 x 10 each LE ?Supine clam with green loop x 20 ?Supine bridge x 20 ?Sidelying clam with green loop x 20 both ?Sidelying hip abduction x 20 (eccentric lowering) ?Supine ITB/lateral hip stretch (left) 5 x 10 sec ?Supine hip adductor stretch with strap 5 x 10 sec ?Seated piriformis stretch x 5 hold 10 sec each ?Ice to left hip x 10 min ? ?01/01/22: ?NuStep x 5 min level 2 (PT present to discuss progress toward goals) ?LE band walks with green loop x 3 laps of 10 steps each way ?Cone touches 3 x 10 each LE (cone on table and standing on floor) ?1D plyo ball toss (red) 3 x 10 each LE ?Supine clam with green loop x 20 ?Supine bridge x 20 ?Sidelying clam with green loop x 20  ?Sidelying hip abduction x 20 (eccentric lowering) ?Supine ITB/lateral hip stretch (left) 5 x 10 sec ?Supine hip adductor stretch with strap 5 x 10 sec ?Supine piriformis stretch x 5 hold 10 sec each ?Seated butterfly stretch x 5 hold 10 sec each ?Ice to left hip x 10 min ?12/30/21: ?NuStep x 5 min level 2 (PT present to discuss progress toward goals) ?LE band walks with green loop x 3 laps of 10 steps each way ?Standing left hip abduction (no weight) 2 x 10 ?Standing hip extension (no weight) 2 x 10 ?Standing hip flexor/quad stretch (both) 3 x 30 sec ?Prostretch x 5 hold 10 sec right (5 each gastroc and soleus) ?Supine hamstring stretc (left) 5 x 10 sec ?Supine ITB/lateral hip stretch (left) 5 x 10 sec ?Supine piriformis stretch x 5 hold 10 sec each ?Seated butterfly stretch x 5 hold 10 sec each ?Long sitting Rt ankle PF/great toe flexion with yellow TB  2x10 reps  ?Long sitting Rt ankle inversion with yellow TB 2x10 reps  ?Attempted to correct posture with Rt sidelying hip abduction, but pt continues to compensate with hip flexion.   ?Seated heel raises holding #25 weight plate on thighs 7T24 reps ?Standing  Rt hip abduction toe tap/hold 5x5 sec with PT cuing Lt glute med activation and keeping level pelvis ?MANUAL: STM Rt gastroc region ? ? ?  ?  ?PATIENT EDUCATION:  ?Education details: Educated on need to continue stretching hamstrings and hip flexors due to lack of lumbar lordosis and walking with bent knee posture.  Had patient stand at mirror to point out these abnormal postures.  Also educated on glut medius weakness and why she has trendelenburg gait.     ?Person educated: Patient ?Education method: Explanation; verbal cues  ?Education comprehension: verbalized understanding, returned demonstration, and verbal cues required ?  ?  ?HOME EXERCISE PROGRAM: ?  ?Access Code: [email or web address] ?URL: [email or web address] ?Date: 12/27/2021 ?Prepared by: [facility] ? ?Exercises ?- Seated Figure 4 Piriformis Stretch  - 1 x daily - 7 x weekly - 3 sets - 10 reps ?- Prone Hip Extension  - 1 x daily - 7 x weekly - 3 sets - 10 reps ?- Sidelying Hip Adduction  - 1 x daily - 7 x weekly - 3 sets - 10 reps ?- Small Range Straight Leg Raise  - 1 x daily - 7 x weekly - 3 sets - 10 reps ?- Seated Self Great Toe Stretch  - 2 x daily - 7 x weekly - 5 reps - 10 sec hold ?- Ankle and Toe Plantarflexion with Resistance  - 1 x daily - 5 x weekly - 2 sets - 10 reps ?- Standing Hip Abduction with Counter Support  - 1 x daily - 7 x weekly - 3 sets - 10 reps ?  ?ASSESSMENT: ?  ?CLINICAL IMPRESSION: ?Pt demonstrates significant improvement with dynamic balance training and hip strengthening.  She was able to do 10 consecutive reps on cone touches without lob with improved alignment and control.  She continues to have postural issues and   weak hip ER's and hip abductors that likely contribute to her issues.  She would benefit from continued skilled PT for hip strengthening, flexibility training and quad rehab to progress toward final goals.   ?  ?  ?

## 2022-01-10 ENCOUNTER — Other Ambulatory Visit: Payer: Self-pay | Admitting: Family Medicine

## 2022-01-15 ENCOUNTER — Ambulatory Visit: Payer: Medicare HMO | Attending: Family Medicine

## 2022-01-15 DIAGNOSIS — R252 Cramp and spasm: Secondary | ICD-10-CM | POA: Diagnosis not present

## 2022-01-15 DIAGNOSIS — M25552 Pain in left hip: Secondary | ICD-10-CM

## 2022-01-15 DIAGNOSIS — M6281 Muscle weakness (generalized): Secondary | ICD-10-CM

## 2022-01-15 DIAGNOSIS — R262 Difficulty in walking, not elsewhere classified: Secondary | ICD-10-CM | POA: Diagnosis not present

## 2022-01-15 DIAGNOSIS — R293 Abnormal posture: Secondary | ICD-10-CM

## 2022-01-15 DIAGNOSIS — M79671 Pain in right foot: Secondary | ICD-10-CM | POA: Diagnosis not present

## 2022-01-15 NOTE — Therapy (Signed)
?OUTPATIENT PHYSICAL THERAPY TREATMENT NOTE ? ? ?Patient Name: BELLARAE LIZER ?MRN: [id number] ?DOB:Mar 19, 1946, 76 y.o., female ?Today's Date: 01/15/2022 ? ?PCP: Burnis Medin, MD ?REFERRING PROVIDER: Dene Gentry, MD ? ?END OF SESSION:  ? PT End of Session - 01/15/22 1222   ? ? Visit Number 7   ? Number of Visits 12   ? Date for PT Re-Evaluation 02/12/22   ? Authorization Type [employer]   ? Authorization Time Period Cohere approved 12 PT visits 12/18/2021-02/12/2022  Authorization #[id number]   ? Authorization - Visit Number 7   ? Authorization - Number of Visits 12   ? Progress Note Due on Visit 10   ? PT Start Time 1222   ? PT Stop Time 1300   ? PT Time Calculation (min) 38 min   ? Activity Tolerance Patient tolerated treatment well;No increased pain   ? Behavior During Therapy [facility] for tasks assessed/performed   ? ?  ?  ? ?  ? ? ? ?Past Medical History:  ?Diagnosis Date  ? Anxiety   ? no per pt  ? Arthritis   ? GERD (gastroesophageal reflux disease)   ? Heart murmur   ? 'YEARS AGO"  ? History of smoking   ? History of ulcerative colitis 2005  ? pancolitis   ? Hyperlipidemia   ? Hypertension   ? Normal nuclear stress test 2008  ? myoview   ? ?Past Surgical History:  ?Procedure Laterality Date  ? BREAST BIOPSY Left 1986  ? biopsy x 2   ? COLONOSCOPY    ? KNEE SURGERY Left october  ? x 2  ? ?Patient Active Problem List  ? Diagnosis Date Noted  ? Family history of cardiovascular disease 07/05/2012  ? Varicose veins 07/05/2012  ? Ex-smoker 11/14/2011  ? Myalgia 04/05/2011  ? History of ulcerative colitis   ? CHEST WALL PAIN, ANTERIOR 07/02/2009  ? ULCERATIVE COLITIS, HX OF 04/17/2008  ? HYPERLIPIDEMIA 04/15/2007  ? ANXIETY 04/15/2007  ? DEPRESSION 04/15/2007  ? HYPERTENSION 04/15/2007  ? ?  ?PCP: Burnis Medin, MD ?  ?REFERRING PROVIDER: Dene Gentry, MD ?  ?REFERRING DIAG: M25.552 (ICD-10-CM) - Left hip pain  ?  ?THERAPY DIAG:  ?Pain in left hip ?  ?Difficulty in walking, not elsewhere classified ?   ?Pain in right foot ?  ?Muscle weakness (generalized) ?  ?ONSET DATE: 11/13/21 ?  ?SUBJECTIVE: Patient states she is having some increased right medial ankle and foot pain since last visit.  She states her hip pain is improving but still bothersome at times.   ?  ?SUBJECTIVE STATEMENT: ?Pt states she is doing "great!" ?  ?PERTINENT HISTORY: ?Hx of left TKA ?  ?PAIN:  ?Are you having pain? Yes: Pain location: 3/10 right foot; left hip 5/10 ?Pain description: 10/10 in right foot at worst/ 10/10 at worst left hip ?Aggravating factors: walking  ?Relieving factors: nitro patches, rest, anti-inflammatory ?  ?PRECAUTIONS: None ?  ?WEIGHT BEARING RESTRICTIONS No ?  ?FALLS:  ?Has patient fallen in last 6 months? No ?  ?LIVING ENVIRONMENT: ?Lives with: lives with their spouse ?Lives in: House/apartment ?Stairs: Yes: Internal: 12 steps; can reach both and External: 5 steps; can reach both ?Has following equipment at home: None ?  ?OCCUPATION: retired just last year ?  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  ?PATIENT GOALS To be able  to walk without pain and to have better balance ?  ?  ?OBJECTIVE:  ?  ?DIAGNOSTIC FINDINGS: Left hip : unremarkable  Right foot: unremarkable (diagnoses as tendinitis) ?  ?PATIENT SURVEYS:  ?FOTO 92 ?  ?COGNITION: ?          Overall cognitive status: Within functional limits for tasks assessed               ?           ?SENSATION: ?WFL ?  ?MUSCLE LENGTH: ?Hamstrings: Right 60 deg; Left 60 deg ?Thomas test: Positive bilaterally ?  ?POSTURE:  ?Normal with slight decreased lumbar lordosis ?  ?PALPATION: ?Tender and tight bilateral multifidi ?  ?LE Active ROM:  WFL ?  ?Great toe extension: PROM Rt: 25 deg, Lt: 35 deg   ?  ?LE MMT:  generally 4 to 4+/5 ?  ?  ?LOWER EXTREMITY SPECIAL TESTS:  ?Hip special tests: Saralyn Pilar (FABER) test: positive , Trendelenburg test: negative, and Thomas test: positive  ?  ?FUNCTIONAL TESTS:  ?5  times sit to stand: 11 ?  ?GAIT: ?Distance walked: 50 ?Assistive device utilized: None ?Level of assistance: Complete Independence ?Comments: antalgic ?  ?  ?  ?TODAY'S TREATMENT: ?01/15/22: ?NuStep x 5 min level 2 (PT present to discuss progress toward goals) ?Supine hamstring stretch 2 x 30 left ?Supine ITB/lateral hip stretch (left) 5 x 10 sec ?Seated piriformis stretch x 5 hold 10 sec each ?Supine SLR x 20 (left) ?SL hip abduction x 20 with heavy verbal cues on positioning and alignment (left) ?Prone hip extension x 20 ?Bilateral hip IR and ER simultaneous x 5 each side hold 20 sec each ?Manual STM and ROM to right ankle and foot x 10 min ?01/07/22: ?NuStep x 5 min level 2 (PT present to discuss progress toward goals) ?Step ups with opposite LE hip extension x 10 each ?LE band walks with green loop x 3 laps of 10 steps each way ?Educated patient on posture and lack of lumbar lordosi ?Cone touches 3 x 10 each LE (cone on chair and standing on floor) ?BOSU lunges x 10 ea LE fwd and lateral ?1D plyo ball toss (red) 3 x 10 each LE ?Supine clam with green loop x 20 ?Supine bridge x 20 ?Sidelying clam with green loop x 20 both ?Sidelying hip abduction x 20 (eccentric lowering) ?Supine ITB/lateral hip stretch (left) 5 x 10 sec ?Supine hip adductor stretch with strap 5 x 10 sec ?Seated piriformis stretch x 5 hold 10 sec each ?Ice to left hip x 10 min ? ?01/01/22: ?NuStep x 5 min level 2 (PT present to discuss progress toward goals) ?LE band walks with green loop x 3 laps of 10 steps each way ?Cone touches 3 x 10 each LE (cone on table and standing on floor) ?1D plyo ball toss (red) 3 x 10 each LE ?Supine clam with green loop x 20 ?Supine bridge x 20 ?Sidelying clam with green loop x 20  ?Sidelying hip abduction x 20 (eccentric lowering) ?Supine ITB/lateral hip stretch (left) 5 x 10 sec ?Supine hip adductor stretch with strap 5 x 10 sec ?Supine piriformis stretch x 5 hold 10 sec each ?Seated butterfly stretch x 5 hold 10 sec  each ?Ice to left hip x 10 min ? ?  ?  ?PATIENT EDUCATION:  ?Education details: Educated on need to continue stretching hamstrings and hip flexors due to lack of lumbar lordosis and walking with bent knee posture.  Had patient  stand at mirror to point out these abnormal postures.  Also educated on glut medius weakness and why she has trendelenburg gait.     ?Person educated: Patient ?Education method: Explanation; verbal cues  ?Education comprehension: verbalized understanding, returned demonstration, and verbal cues required ?  ?  ?HOME EXERCISE PROGRAM: ?  ?Access Code: [email or web address] ?URL: [email or web address] ?Date: 12/27/2021 ?Prepared by: [facility] ? ?Exercises ?- Seated Figure 4 Piriformis Stretch  - 1 x daily - 7 x weekly - 3 sets - 10 reps ?- Prone Hip Extension  - 1 x daily - 7 x weekly - 3 sets - 10 reps ?- Sidelying Hip Adduction  - 1 x daily - 7 x weekly - 3 sets - 10 reps ?- Small Range Straight Leg Raise  - 1 x daily - 7 x weekly - 3 sets - 10 reps ?- Seated Self Great Toe Stretch  - 2 x daily - 7 x weekly - 5 reps - 10 sec hold ?- Ankle and Toe Plantarflexion with Resistance  - 1 x daily - 5 x weekly - 2 sets - 10 reps ?- Standing Hip Abduction with Counter Support  - 1 x daily - 7 x weekly - 3 sets - 10 reps ?  ?ASSESSMENT: ?  ?CLINICAL IMPRESSION: ?Pt had some right ankle and foot pain after last session.  She states her hip is still bothersome but improved.  She responded well to manual techniques for right foot and hip IR and ER stretches.  These stretches were added to HEP.    She would benefit from continued skilled PT for hip strengthening, flexibility training and quad rehab to progress toward final goals.   ?  ?  ?OBJECTIVE IMPAIRMENTS Abnormal gait, decreased balance, decreased knowledge of condition, difficulty walking, decreased ROM, decreased strength, increased fascial restrictions, increased muscle spasms, impaired flexibility,  improper body mechanics, postural dysfunction, and pain.  ?  ?ACTIVITY LIMITATIONS cleaning, community activity, driving, occupation, laundry, yard work, and shopping.  ?  ?PERSONAL FACTORS Age, Education,

## 2022-01-22 ENCOUNTER — Encounter: Payer: Medicare HMO | Admitting: Physical Therapy

## 2022-01-22 ENCOUNTER — Ambulatory Visit: Payer: Medicare HMO | Admitting: Family Medicine

## 2022-01-22 VITALS — BP 136/92 | Ht 63.0 in | Wt 150.0 lb

## 2022-01-22 DIAGNOSIS — M25552 Pain in left hip: Secondary | ICD-10-CM | POA: Diagnosis not present

## 2022-01-22 DIAGNOSIS — M79671 Pain in right foot: Secondary | ICD-10-CM | POA: Diagnosis not present

## 2022-01-22 NOTE — Patient Instructions (Signed)
Use the inserts in your new shoes. ?Avoid flat shoes, barefoot walking ?Continue nitro patches. ?Icing 15 minutes at a time if needed. ?Consider our hapad insoles if this isn't enough help or short term in a walking boot. ? ?Continue physical therapy for your hip and do home exercises on days you don't go to therapy. ?We can consider injection if you're struggling but the increased strength in your hip is the most important part for this to resolve. ?Follow up with me in 5-6 weeks but call me sooner if needed. ?

## 2022-01-23 ENCOUNTER — Encounter: Payer: Self-pay | Admitting: Family Medicine

## 2022-01-23 NOTE — Progress Notes (Signed)
PCP: Burnis Medin, MD ? ?Subjective:  ? ?HPI: ?Patient is a 76 y.o. female here for right foot, left hip pain. ? ?2/22: ?Patient reports about 1.5 months of right foot pain. ?Started when she woke up - no acute injury. ?Pain was laterally initially - seen by podiatry and had injection which helped but then pain was more medial. ?Had 2 more injections medial ankle and foot but continues to struggle with medial foot and ankle pain. ?Notices more when walking barefoot. ?Wearing more supportive shoes now. ?Also had several months of lateral left hip pain - had been getting bursal injections every 3 months for this but without lasting relief. ?No back pain, numbness. ? ?3/29: ?Patient reports she has been doing home exercises and using theragun for her right ankle/lower leg/foot and noticed improvement. ?Just started wearing sandals with good arch support. ?Mild improvement here since last visit. ?Left lateral hip unchanged, very sore. ? ?5/11: ?Patient reports her left hip is continuing to improve. ?Doing physical therapy and home exercises - still notices weakness in this hip, trouble lying on left side. ?Her right foot is still about the same though. ?She just bought some new shoes and some inserts (does not have the inserts in right now). ?Tolerating nitro. ?Pain noted in medial right ankle. ?Taking tylenol, using diclofenac. ? ?Past Medical History:  ?Diagnosis Date  ? Anxiety   ? no per pt  ? Arthritis   ? GERD (gastroesophageal reflux disease)   ? Heart murmur   ? 'YEARS AGO"  ? History of smoking   ? History of ulcerative colitis 2005  ? pancolitis   ? Hyperlipidemia   ? Hypertension   ? Normal nuclear stress test 2008  ? myoview   ? ? ?Current Outpatient Medications on File Prior to Visit  ?Medication Sig Dispense Refill  ? diclofenac (VOLTAREN) 75 MG EC tablet diclofenac sodium 75 mg tablet,delayed release    ? escitalopram (LEXAPRO) 10 MG tablet TAKE 1 TABLET EVERY DAY 90 tablet 0  ? lisinopril (ZESTRIL) 10  MG tablet TAKE 1 TABLET EVERY DAY 30 tablet 0  ? moxifloxacin (VIGAMOX) 0.5 % ophthalmic solution moxifloxacin 0.5 % eye drops ? INSTILL 1 DROP 4 TIMES DAILY IN THE RIGHT EYE STARTING 2 DAYS PRIOR TO SURGERY AND 2 GTTS THE MORNING OF SURGERY    ? nitroGLYCERIN (NITRODUR - DOSED IN MG/24 HR) 0.2 mg/hr patch Apply 1/4th patch to affected ankle, change daily 30 patch 1  ? rosuvastatin (CRESTOR) 20 MG tablet Take 1 tablet (20 mg total) by mouth daily. 90 tablet 0  ? SHINGRIX injection     ? ?No current facility-administered medications on file prior to visit.  ? ? ?Past Surgical History:  ?Procedure Laterality Date  ? BREAST BIOPSY Left 1986  ? biopsy x 2   ? COLONOSCOPY    ? KNEE SURGERY Left october  ? x 2  ? ? ?No Known Allergies ? ?BP (!) 136/92   Ht 5' 3"  (1.6 m)   Wt 150 lb (68 kg)   BMI 26.57 kg/m?  ? ? ?  11/06/2021  ?  2:33 PM  ?[place] [facility] Exercise  ?Frequency of aerobic exercise (# of days/week) 2  ?Average time in minutes 15  ?Frequency of strengthening activities (# of days/week) 0  ? ? ?   ? View : No data to display.  ?  ?  ?  ? ? ?    ?Objective:  ?Physical Exam: ? ?Gen: NAD,  comfortable in exam room ? ?Right foot/ankle: ?No gross deformity, swelling, ecchymoses ?FROM without pain. ?TTP along course of post tib tendon. ?Thompsons test negative. ?NV intact distally. ? ?Left hip: ?No deformity. ?FROM with 5/5 strength except 4/5 hip abduction. ?Tenderness to palpation over greater trochanter. ?NVI distally. ?Negative logroll ? ?Assessment & Plan:  ?1. Right foot/ankle pain - 2/2 post tib tendinopathy/tenosynovitis.  Discussed options - she will continue nitro patches.  She would like to try current new inserts and shoes as next step.  Consider sports insoles with scaphoid pads or walking boot if not improving. ? ?2. Left hip pain - 2/2 greater trochanteric pain syndrome and gluteal tendinopathy.  Still with notable weakness hip abduction.  Continue physical therapy.  Consider steroid  injection as adjunct. ?

## 2022-01-29 ENCOUNTER — Ambulatory Visit: Payer: Medicare HMO

## 2022-01-29 DIAGNOSIS — R262 Difficulty in walking, not elsewhere classified: Secondary | ICD-10-CM | POA: Diagnosis not present

## 2022-01-29 DIAGNOSIS — M6281 Muscle weakness (generalized): Secondary | ICD-10-CM | POA: Diagnosis not present

## 2022-01-29 DIAGNOSIS — M79671 Pain in right foot: Secondary | ICD-10-CM | POA: Diagnosis not present

## 2022-01-29 DIAGNOSIS — R293 Abnormal posture: Secondary | ICD-10-CM | POA: Diagnosis not present

## 2022-01-29 DIAGNOSIS — M25552 Pain in left hip: Secondary | ICD-10-CM

## 2022-01-29 DIAGNOSIS — R252 Cramp and spasm: Secondary | ICD-10-CM

## 2022-01-29 NOTE — Therapy (Signed)
?OUTPATIENT PHYSICAL THERAPY TREATMENT NOTE ? ? ?Patient Name: Shelley Wright ?MRN: [id number] ?DOB:April 15, 1946, 76 y.o., female ?Today's Date: 01/29/2022 ? ?PCP: Burnis Medin, MD ?REFERRING PROVIDER: Dene Gentry, MD ? ?END OF SESSION:  ? PT End of Session - 01/29/22 1233   ? ? Visit Number 8   ? Number of Visits 12   ? Date for PT Re-Evaluation 02/12/22   ? Authorization Type [employer]   ? Authorization Time Period Cohere approved 12 PT visits 12/18/2021-02/12/2022  Authorization #[id number]   ? Authorization - Visit Number 8   ? Authorization - Number of Visits 12   ? Progress Note Due on Visit 10   ? PT Start Time 1230   ? PT Stop Time 1315   ? PT Time Calculation (min) 45 min   ? Activity Tolerance Patient tolerated treatment well;No increased pain   ? Behavior During Therapy [facility] for tasks assessed/performed   ? ?  ?  ? ?  ? ? ? ?Past Medical History:  ?Diagnosis Date  ? Anxiety   ? no per pt  ? Arthritis   ? GERD (gastroesophageal reflux disease)   ? Heart murmur   ? 'YEARS AGO"  ? History of smoking   ? History of ulcerative colitis 2005  ? pancolitis   ? Hyperlipidemia   ? Hypertension   ? Normal nuclear stress test 2008  ? myoview   ? ?Past Surgical History:  ?Procedure Laterality Date  ? BREAST BIOPSY Left 1986  ? biopsy x 2   ? COLONOSCOPY    ? KNEE SURGERY Left october  ? x 2  ? ?Patient Active Problem List  ? Diagnosis Date Noted  ? Family history of cardiovascular disease 07/05/2012  ? Varicose veins 07/05/2012  ? Ex-smoker 11/14/2011  ? Myalgia 04/05/2011  ? History of ulcerative colitis   ? CHEST WALL PAIN, ANTERIOR 07/02/2009  ? ULCERATIVE COLITIS, HX OF 04/17/2008  ? HYPERLIPIDEMIA 04/15/2007  ? ANXIETY 04/15/2007  ? DEPRESSION 04/15/2007  ? HYPERTENSION 04/15/2007  ? ?  ?PCP: Burnis Medin, MD ?  ?REFERRING PROVIDER: Dene Gentry, MD ?  ?REFERRING DIAG: M25.552 (ICD-10-CM) - Left hip pain  ?  ?THERAPY DIAG:  ?Pain in left hip ?  ?Difficulty in walking, not elsewhere classified ?   ?Pain in right foot ?  ?Muscle weakness (generalized) ?  ?ONSET DATE: 11/13/21 ?  ?SUBJECTIVE: Patient states she is doing ok but still having pain in hip at times.   ?  ? ?  ?PERTINENT HISTORY: ?Hx of left TKA ?  ?PAIN:  ?Are you having pain? Yes: Pain location: 3/10 right foot; left hip 5/10 ?Pain description: 10/10 in right foot at worst/ 10/10 at worst left hip ?Aggravating factors: walking  ?Relieving factors: nitro patches, rest, anti-inflammatory ?  ?PRECAUTIONS: None ?  ?WEIGHT BEARING RESTRICTIONS No ?  ?FALLS:  ?Has patient fallen in last 6 months? No ?  ?LIVING ENVIRONMENT: ?Lives with: lives with their spouse ?Lives in: House/apartment ?Stairs: Yes: Internal: 12 steps; can reach both and External: 5 steps; can reach both ?Has following equipment at home: None ?  ?OCCUPATION: retired just last year ?  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  ?PATIENT GOALS To be able to walk without pain and to have better balance ?  ?  ?OBJECTIVE:  ?  ?DIAGNOSTIC FINDINGS: Left hip :  unremarkable  Right foot: unremarkable (diagnoses as tendinitis) ?  ?PATIENT SURVEYS:  ?FOTO 45 ?  ?COGNITION: ?          Overall cognitive status: Within functional limits for tasks assessed               ?           ?SENSATION: ?WFL ?  ?MUSCLE LENGTH: ?Hamstrings: Right 60 deg; Left 60 deg ?Thomas test: Positive bilaterally ?  ?POSTURE:  ?Normal with slight decreased lumbar lordosis ?  ?PALPATION: ?Tender and tight bilateral multifidi ?  ?LE Active ROM:  WFL ?  ?Great toe extension: PROM Rt: 25 deg, Lt: 35 deg   ?  ?LE MMT:  generally 4 to 4+/5 ?  ?  ?LOWER EXTREMITY SPECIAL TESTS:  ?Hip special tests: Saralyn Pilar (FABER) test: positive , Trendelenburg test: negative, and Thomas test: positive  ?  ?FUNCTIONAL TESTS:  ?5 times sit to stand: 11 ?  ?GAIT: ?Distance walked: 50 ?Assistive device utilized: None ?Level of assistance: Complete Independence ?Comments:  antalgic ?  ?  ?  ?TODAY'S TREATMENT: ?01/29/22: ?NuStep x 5 min level 2 (PT present to discuss progress toward goals) ?Multi hip: abd and ext 2 x 10 each on each LE ?Leg Press 2 x 10 50 lb, then calf press x 20 at 50 lb ?Lateral band walks x 4 laps of 10 steps (green loop) ?Lengthy discussion regarding general LE weakness and how it is affecting her back and hip: suggested we re-direct our efforts at total LE strengthening.  ?Seated clam x 20 with yellow loop.   ?Sit to stand 2 x 10 with yellow loop to promote use of hip ER's for improved alignment.    ?01/15/22: ?NuStep x 5 min level 2 (PT present to discuss progress toward goals) ?Supine hamstring stretch 2 x 30 left ?Supine ITB/lateral hip stretch (left) 5 x 10 sec ?Seated piriformis stretch x 5 hold 10 sec each ?Supine SLR x 20 (left) ?SL hip abduction x 20 with heavy verbal cues on positioning and alignment (left) ?Prone hip extension x 20 ?Bilateral hip IR and ER simultaneous x 5 each side hold 20 sec each ?Manual STM and ROM to right ankle and foot x 10 min ?01/07/22: ?NuStep x 5 min level 2 (PT present to discuss progress toward goals) ?Step ups with opposite LE hip extension x 10 each ?LE band walks with green loop x 3 laps of 10 steps each way ?Educated patient on posture and lack of lumbar lordosi ?Cone touches 3 x 10 each LE (cone on chair and standing on floor) ?BOSU lunges x 10 ea LE fwd and lateral ?1D plyo ball toss (red) 3 x 10 each LE ?Supine clam with green loop x 20 ?Supine bridge x 20 ?Sidelying clam with green loop x 20 both ?Sidelying hip abduction x 20 (eccentric lowering) ?Supine ITB/lateral hip stretch (left) 5 x 10 sec ?Supine hip adductor stretch with strap 5 x 10 sec ?Seated piriformis stretch x 5 hold 10 sec each ?Ice to left hip x 10 min ? ? ?  ?  ?PATIENT EDUCATION:  ?Education details: Educated on need to continue stretching hamstrings and hip flexors due to lack of lumbar lordosis and walking with bent knee posture.  Had patient stand  at mirror to point out these abnormal postures.  Also educated on glut medius weakness and why she has trendelenburg gait.     ?Person educated: Patient ?Education method: Explanation; verbal cues  ?Education comprehension:  verbalized understanding, returned demonstration, and verbal cues required ?  ?  ?HOME EXERCISE PROGRAM: ?  ?Access Code: [email or web address] ?URL: [email or web address] ?Date: 12/27/2021 ?Prepared by: [facility] ? ?Exercises ?- Seated Figure 4 Piriformis Stretch  - 1 x daily - 7 x weekly - 3 sets - 10 reps ?- Prone Hip Extension  - 1 x daily - 7 x weekly - 3 sets - 10 reps ?- Sidelying Hip Adduction  - 1 x daily - 7 x weekly - 3 sets - 10 reps ?- Small Range Straight Leg Raise  - 1 x daily - 7 x weekly - 3 sets - 10 reps ?- Seated Self Great Toe Stretch  - 2 x daily - 7 x weekly - 5 reps - 10 sec hold ?- Ankle and Toe Plantarflexion with Resistance  - 1 x daily - 5 x weekly - 2 sets - 10 reps ?- Standing Hip Abduction with Counter Support  - 1 x daily - 7 x weekly - 3 sets - 10 reps ?  ?ASSESSMENT: ?  ?CLINICAL IMPRESSION: ?Pt is responding favorably to current treatment regimen but is still extremely weak bilateral quads, hamstrings and hips.   She would benefit from continued skilled PT for hip strengthening, flexibility training and quad rehab to progress toward final goals.   ?  ?  ?OBJECTIVE IMPAIRMENTS Abnormal gait, decreased balance, decreased knowledge of condition, difficulty walking, decreased ROM, decreased strength, increased fascial restrictions, increased muscle spasms, impaired flexibility, improper body mechanics, postural dysfunction, and pain.  ?  ?ACTIVITY LIMITATIONS cleaning, community activity, driving, occupation, laundry, yard work, and shopping.  ?  ?PERSONAL FACTORS Age, Education, Fitness, Past/current experiences, and Profession are also affecting patient's functional outcome.  ?  ?  ?REHAB POTENTIAL: Good ?   ?CLINICAL DECISION MAKING: Stable/uncomplicated ?  ?EVALUATION COMPLEXITY: Low ?  ?  ?GOALS: ?Goals reviewed with patient? Yes ?  ?SHORT TERM GOALS: Target date: 01/15/2022 ?  ?Patient will be independent with initial HEP

## 2022-02-05 ENCOUNTER — Ambulatory Visit: Payer: Medicare HMO

## 2022-02-05 DIAGNOSIS — M6281 Muscle weakness (generalized): Secondary | ICD-10-CM

## 2022-02-05 DIAGNOSIS — M25552 Pain in left hip: Secondary | ICD-10-CM

## 2022-02-05 DIAGNOSIS — M79671 Pain in right foot: Secondary | ICD-10-CM | POA: Diagnosis not present

## 2022-02-05 DIAGNOSIS — R262 Difficulty in walking, not elsewhere classified: Secondary | ICD-10-CM | POA: Diagnosis not present

## 2022-02-05 DIAGNOSIS — R252 Cramp and spasm: Secondary | ICD-10-CM | POA: Diagnosis not present

## 2022-02-05 DIAGNOSIS — R293 Abnormal posture: Secondary | ICD-10-CM

## 2022-02-05 NOTE — Therapy (Addendum)
OUTPATIENT PHYSICAL THERAPY DISCHARGE NOTE   Patient Name: Shelley Wright MRN: [id number] DOB:Aug 27, 1946, 76 y.o., female Today's Date: 02/05/2022  PCP: Burnis Medin, MD REFERRING PROVIDER: Dene Gentry, MD  END OF SESSION:   PT End of Session - 02/05/22 1238     Visit Number 9    Number of Visits 12    Date for PT Re-Evaluation 02/12/22    Authorization Type [employer]    Authorization Time Period Cohere approved 12 PT visits 12/18/2021-02/12/2022  Authorization #[id number]    Authorization - Visit Number 9    Authorization - Number of Visits 12    Progress Note Due on Visit 10    PT Start Time 8101    PT Stop Time 1310    PT Time Calculation (min) 37 min    Activity Tolerance Patient tolerated treatment well;No increased pain    Behavior During Therapy WFL for tasks assessed/performed              Past Medical History:  Diagnosis Date   Anxiety    no per pt   Arthritis    GERD (gastroesophageal reflux disease)    Heart murmur    'YEARS AGO"   History of smoking    History of ulcerative colitis 2005   pancolitis    Hyperlipidemia    Hypertension    Normal nuclear stress test 2008   myoview    Past Surgical History:  Procedure Laterality Date   BREAST BIOPSY Left 1986   biopsy x 2    COLONOSCOPY     KNEE SURGERY Left october   x 2   Patient Active Problem List   Diagnosis Date Noted   Family history of cardiovascular disease 07/05/2012   Varicose veins 07/05/2012   Ex-smoker 11/14/2011   Myalgia 04/05/2011   History of ulcerative colitis    CHEST WALL PAIN, ANTERIOR 07/02/2009   ULCERATIVE COLITIS, HX OF 04/17/2008   HYPERLIPIDEMIA 04/15/2007   ANXIETY 04/15/2007   DEPRESSION 04/15/2007   HYPERTENSION 04/15/2007     PCP: Burnis Medin, MD   REFERRING PROVIDER: Dene Gentry, MD   REFERRING DIAG: [phone] (ICD-10-CM) - Left hip pain    THERAPY DIAG:  Pain in left hip   Difficulty in walking, not elsewhere classified    Pain in right foot   Muscle weakness (generalized)   ONSET DATE: 11/13/21   SUBJECTIVE: Patient states she is doing good.  She has occasional pain but is managing this well.          PERTINENT HISTORY: Hx of left TKA   PAIN:  Are you having pain? Yes: Pain location: 3/10 right foot; left hip 5/10 Pain description: 10/10 in right foot at worst/ 10/10 at worst left hip Aggravating factors: walking  Relieving factors: nitro patches, rest, anti-inflammatory   PRECAUTIONS: None   WEIGHT BEARING RESTRICTIONS No   FALLS:  Has patient fallen in last 6 months? 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- having to sit on a stool)   PATIENT GOALS To be able to walk without pain and to have better balance     OBJECTIVE:    DIAGNOSTIC  FINDINGS: Left hip : unremarkable  Right foot: unremarkable (diagnoses as tendinitis)   PATIENT SURVEYS:  FOTO 49  (DC 46)   COGNITION:           Overall cognitive status: Within functional limits for tasks assessed                          SENSATION: WFL   MUSCLE LENGTH: Hamstrings: Right 65 deg; Left 65 deg Thomas test: Positive bilaterally   POSTURE:  Normal with slight decreased lumbar lordosis   PALPATION: Tender and tight bilateral multifidi   LE Active ROM:  WFL   Great toe extension: PROM Rt: 25 deg, Lt: 35 deg     LE MMT:  generally 4+/5     LOWER EXTREMITY SPECIAL TESTS:  Hip special tests: Saralyn Pilar (FABER) test: positive , Trendelenburg test: negative, and Thomas test: positive    FUNCTIONAL TESTS:  5 times sit to stand: 9   GAIT: Distance walked: 50 Assistive device utilized: None Level of assistance: Complete  Independence Comments: normal heel to toe progression       TODAY'S TREATMENT: 02/05/22: DC assessment  Reviewed HEP 01/29/22: NuStep x 5 min level 2 (PT present to discuss progress toward goals) Multi hip: abd and ext 2 x 10 each on each LE Leg Press 2 x 10 50 lb, then calf press x 20 at 50 lb Lateral band walks x 4 laps of 10 steps (green loop) Lengthy discussion regarding general LE weakness and how it is affecting her back and hip: suggested we re-direct our efforts at total LE strengthening.  Seated clam x 20 with yellow loop.   Sit to stand 2 x 10 with yellow loop to promote use of hip ER's for improved alignment.    01/15/22: NuStep x 5 min level 2 (PT present to discuss progress toward goals) Supine hamstring stretch 2 x 30 left Supine ITB/lateral hip stretch (left) 5 x 10 sec Seated piriformis stretch x 5 hold 10 sec each Supine SLR x 20 (left) SL hip abduction x 20 with heavy verbal cues on positioning and alignment (left) Prone hip extension x 20 Bilateral hip IR and ER simultaneous x 5 each side hold 20 sec each Manual STM and ROM to right ankle and foot x 10 min      PATIENT EDUCATION:  Education details: Educated on need to continue stretching hamstrings and hip flexors due to lack of lumbar lordosis and walking with bent knee posture.  Had patient stand at mirror to point out these abnormal postures.  Also educated on glut medius weakness and why she has trendelenburg gait.     Person educated: Patient Education method: Explanation; verbal cues  Education comprehension: verbalized understanding, returned demonstration, and verbal cues required     HOME EXERCISE PROGRAM: Access Code: [email or web address] URL: [email or web address] Date: 02/05/2022 Prepared by: Candyce Churn  Exercises - Side Stepping with Resistance at Ankles  - 1 x daily - 7 x weekly - 3 sets - 10 reps - Hooklying Clamshell with Resistance  - 1 x daily - 7 x weekly - 2 sets - 10 reps -  Clamshell with Resistance  - 1 x daily - 7 x weekly - 2 sets - 10 reps - Sidelying Hip Abduction  - 1 x daily - 7 x weekly - 3 sets - 10 reps - Prone Hip Extension  - 1 x daily - 7 x weekly - 3 sets - 10 reps -  Sidelying Hip Adduction  - 1 x daily - 7 x weekly - 3 sets - 10 reps - Small Range Straight Leg Raise  - 1 x daily - 7 x weekly - 3 sets - 10 reps - Supine ITB Stretch with Strap  - 1 x daily - 7 x weekly - 1 sets - 5 reps - 10 hold - Seated Figure 4 Piriformis Stretch  - 1 x daily - 7 x weekly - 3 sets - 10 reps - Seated Self Great Toe Stretch  - 2 x daily - 7 x weekly - 5 reps - 10 sec hold - Ankle and Toe Plantarflexion with Resistance  - 1 x daily - 5 x weekly - 2 sets - 10 reps    ASSESSMENT:   CLINICAL IMPRESSION: Pt has met most of her goals.  She is independent and compliant with her HEP.  She is resuming daily exercise routine. She requests to be discharged at this time to continue independently.      OBJECTIVE IMPAIRMENTS Abnormal gait, decreased balance, decreased knowledge of condition, difficulty walking, decreased ROM, decreased strength, increased fascial restrictions, increased muscle spasms, impaired flexibility, improper body mechanics, postural dysfunction, and pain.    ACTIVITY LIMITATIONS cleaning, community activity, driving, occupation, Medical sales representative, yard work, and shopping.    PERSONAL FACTORS Age, Education, Fitness, Past/current experiences, and Profession are also affecting patient's functional outcome.      REHAB POTENTIAL: Good   CLINICAL DECISION MAKING: Stable/uncomplicated   EVALUATION COMPLEXITY: Low     GOALS: Goals reviewed with patient? Yes   SHORT TERM GOALS: Target date: 01/15/2022   Patient will be independent with initial HEP  Baseline: Goal status: met   2.  Pain report to be no greater than 4/10  Baseline:  Goal status: met   3.  Patient to report 25% improvement in overall symptoms Baseline:  Goal status: met       LONG TERM  GOALS: Target date: 02/12/2022   Patient to be independent with advanced HEP  Baseline:  Goal status: met   2.  Patient to report pain no greater than 2/10  Baseline:  Goal status: partially met   3.  Patient to be able to walk 1 mile without hip pain Baseline:  Goal status: partially met   4.  Patient to be able to stand to bake and decorate cakes vs. Having to sit on stool Baseline:  Goal status: met     PLAN: PT FREQUENCY: 1-2x/week   PT DURATION: 8 weeks   PLANNED INTERVENTIONS: Therapeutic exercises, Therapeutic activity, Neuromuscular re-education, Balance training, Gait training, Patient/Family education, Joint mobilization, Stair training, DME instructions, Aquatic Therapy, Dry Needling, Electrical stimulation, Cryotherapy, Moist heat, Splintting, Taping, Vasopneumatic device, Traction, Ultrasound, and Manual therapy   PLAN FOR NEXT SESSION:we will DC at this time.    PHYSICAL THERAPY DISCHARGE SUMMARY  Visits from Start of Care: 9  Current functional level related to goals / functional outcomes: See above   Remaining deficits: See above   Education / Equipment: See above   Patient agrees to discharge. Patient goals were partially met. Patient is being discharged due to the patient's request.   Anderson Malta B. Xoey Warmoth, PT 02/05/22 11:59 PM  [place] [facility] [place]

## 2022-02-07 ENCOUNTER — Telehealth: Payer: Self-pay

## 2022-02-07 NOTE — Telephone Encounter (Signed)
Pt needs in person appt with Dr Regis Bill. LVM instructions for pt to call office to schedule CPE.

## 2022-02-09 IMAGING — DX DG CHEST 2V
2 series · 2 of 2 positions shown · non-contrast
Comparison: No priors.

CLINICAL DATA: 75-year-old female with history of exercise
intolerance. Dyspnea on exertion.

EXAM:
CHEST - 2 VIEW

[chest pa]
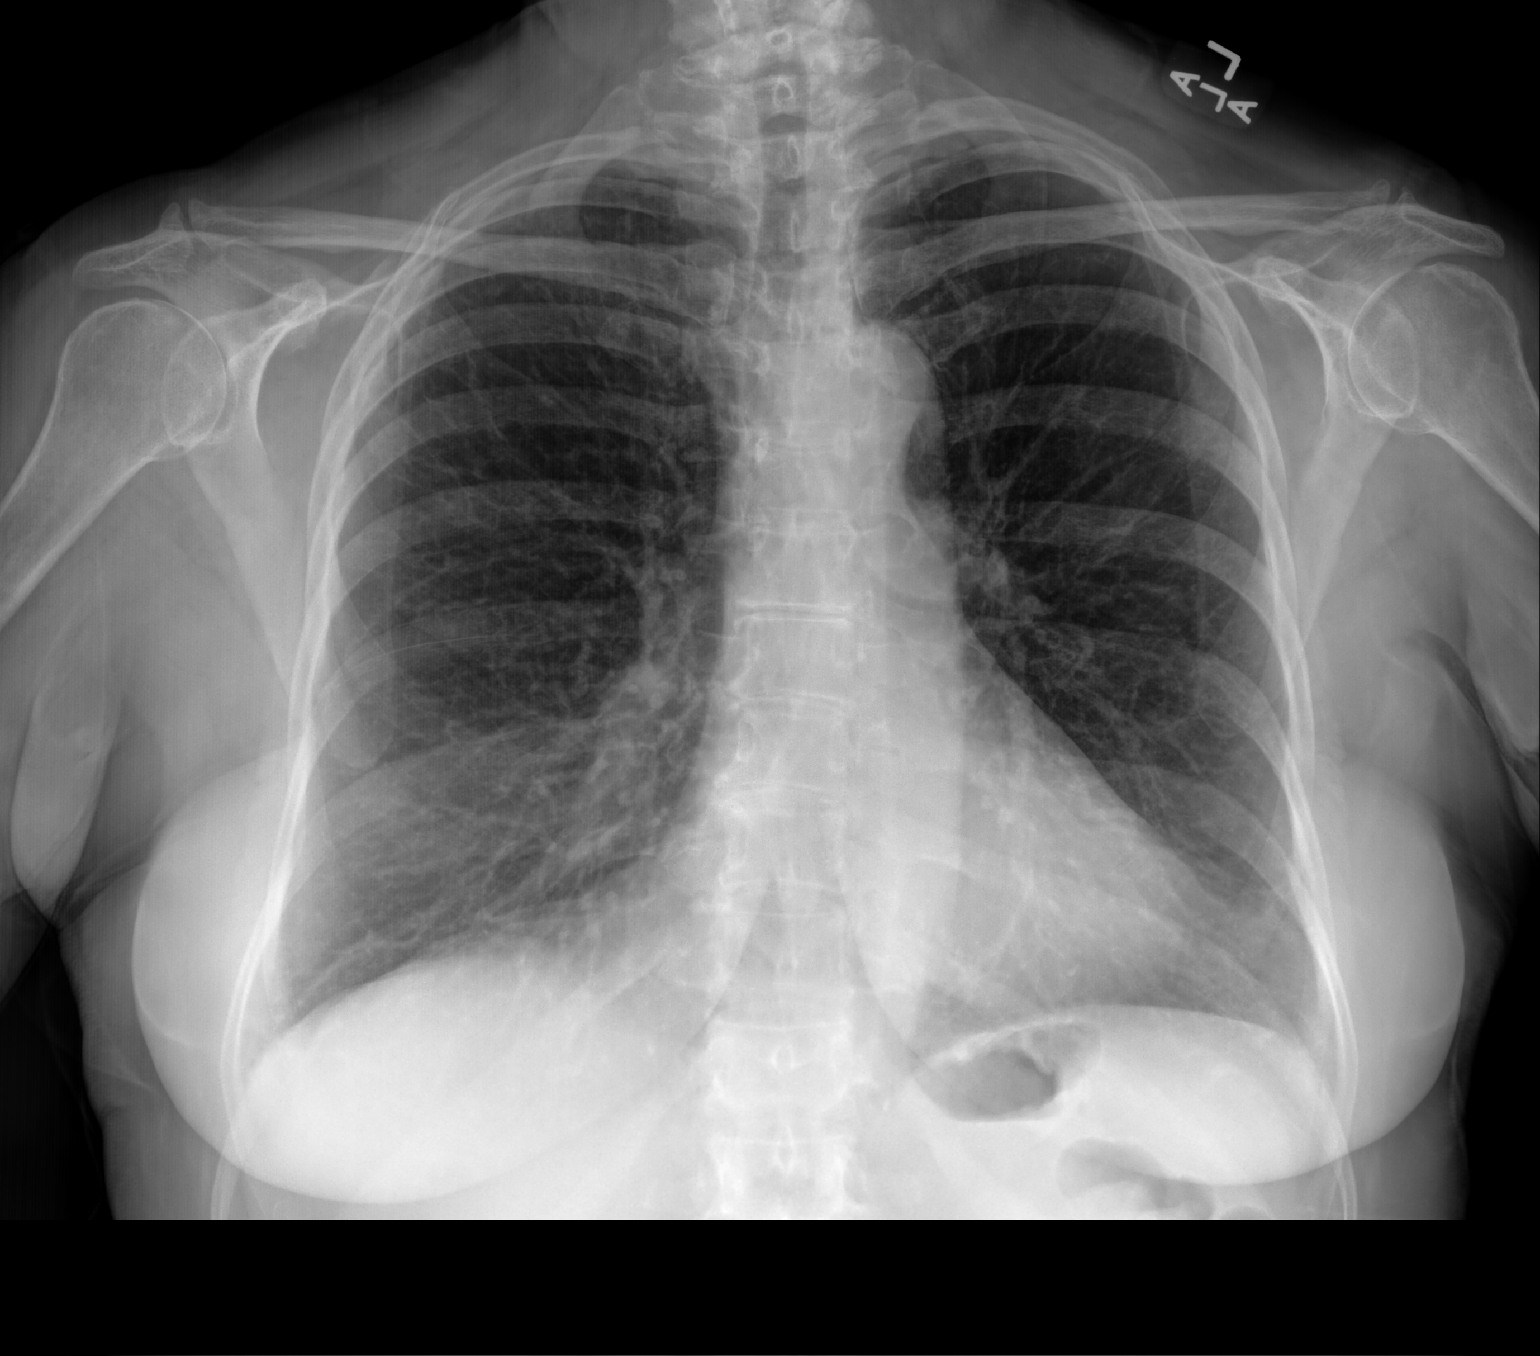

[chest lat]
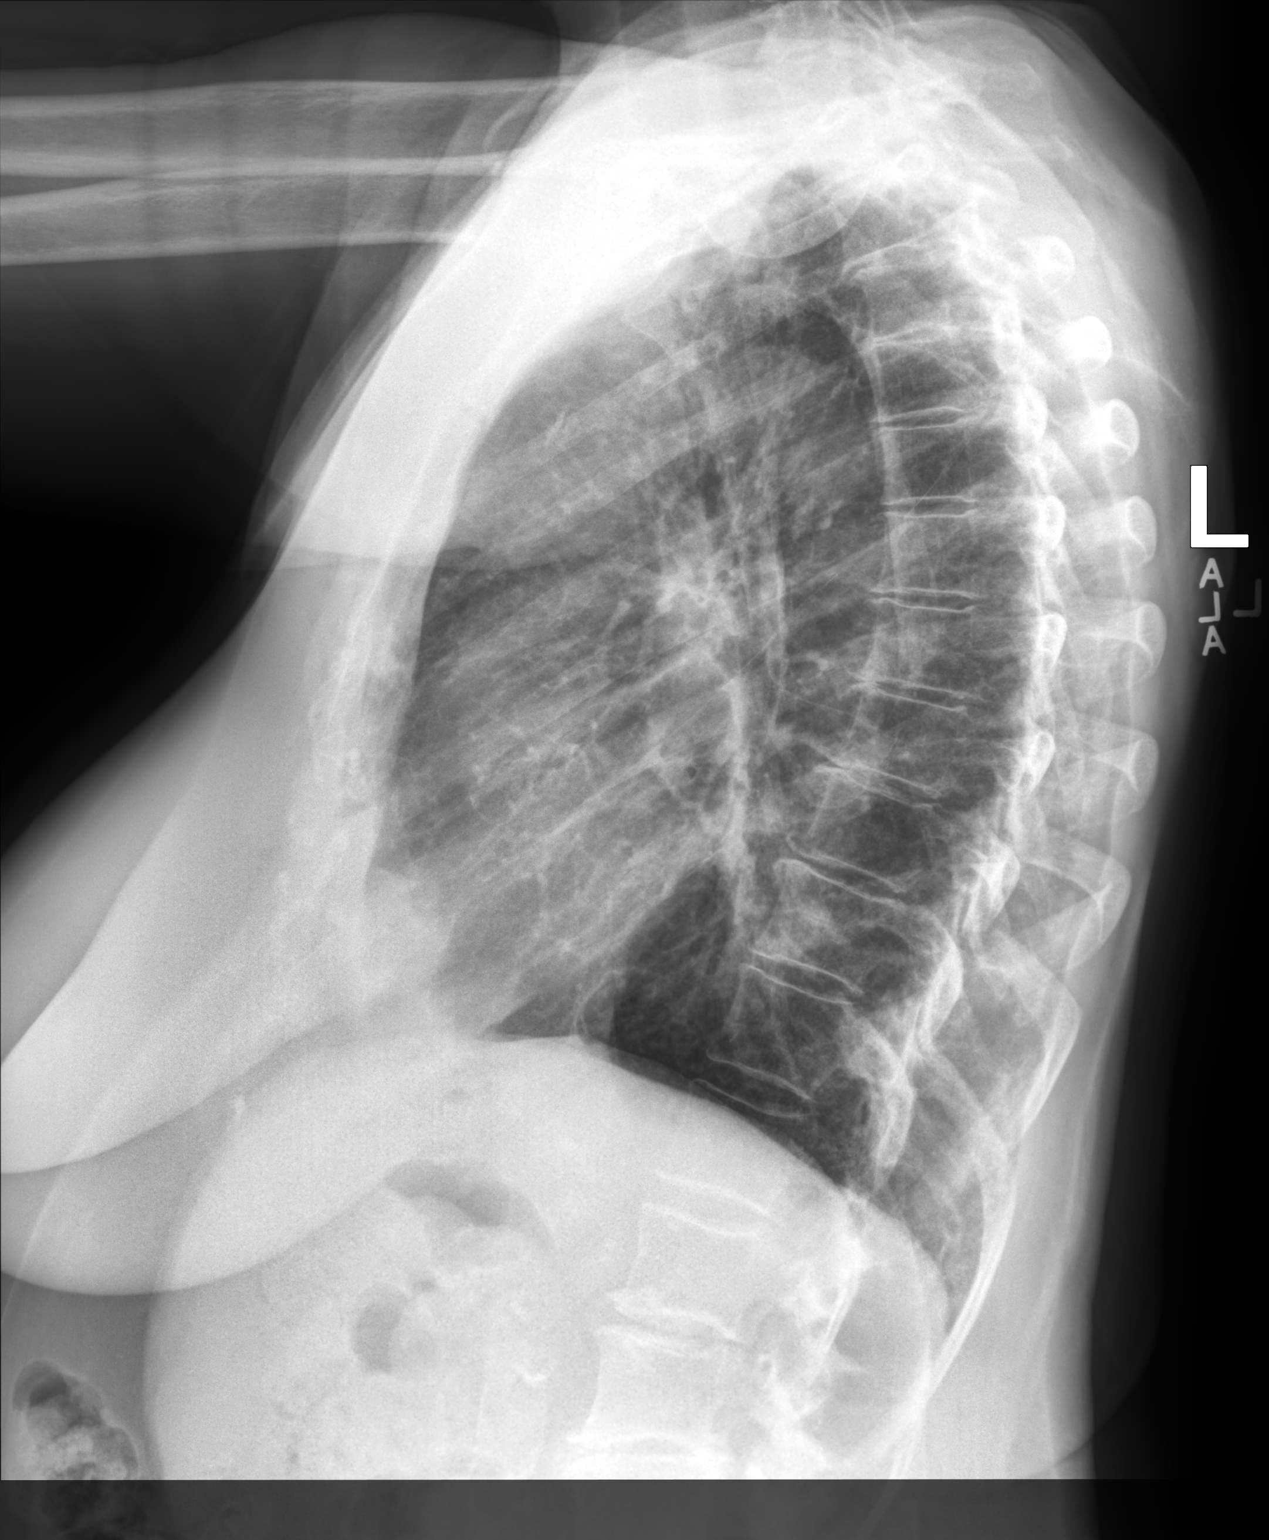

[2 of 2 positions shown; findings below may reference images not displayed]

FINDINGS: Diffuse peribronchial cuffing. Lung volumes are normal. No
consolidative airspace disease. No pleural effusions. No
pneumothorax. No pulmonary nodule or mass noted. Pulmonary
vasculature and the cardiomediastinal silhouette are within normal
limits.
IMPRESSION: 1. Diffuse peribronchial cuffing concerning for an acute bronchitis.

## 2022-02-11 ENCOUNTER — Other Ambulatory Visit: Payer: Self-pay | Admitting: Internal Medicine

## 2022-02-17 ENCOUNTER — Encounter: Payer: Self-pay | Admitting: Family Medicine

## 2022-02-17 ENCOUNTER — Ambulatory Visit: Payer: Medicare HMO | Admitting: Family Medicine

## 2022-02-17 VITALS — BP 122/84 | Ht 63.0 in | Wt 150.0 lb

## 2022-02-17 DIAGNOSIS — M67961 Unspecified disorder of synovium and tendon, right lower leg: Secondary | ICD-10-CM | POA: Diagnosis not present

## 2022-02-17 DIAGNOSIS — M25552 Pain in left hip: Secondary | ICD-10-CM

## 2022-02-17 DIAGNOSIS — M79671 Pain in right foot: Secondary | ICD-10-CM | POA: Diagnosis not present

## 2022-02-17 DIAGNOSIS — M25571 Pain in right ankle and joints of right foot: Secondary | ICD-10-CM | POA: Diagnosis not present

## 2022-02-17 MED ORDER — METHYLPREDNISOLONE ACETATE 40 MG/ML IJ SUSP
40.0000 mg | Freq: Once | INTRAMUSCULAR | Status: AC
Start: 2022-02-17 — End: 2022-02-17
  Administered 2022-02-17: 40 mg via INTRA_ARTICULAR

## 2022-02-17 NOTE — Progress Notes (Signed)
Discussed at last visit considering left greater trochanteric bursa injection in addition to her rehab - she would like to go ahead with this.  After informed written consent timeout was performed, patient was lying on right side on exam table.  Area overlying left trochanteric bursa prepped with alcohol swab then injected with 6:1 lidocaine: depomedrol 66m.  Patient tolerated procedure well without immediate complications.

## 2022-02-17 NOTE — Addendum Note (Signed)
Addended by: Jolinda Croak E on: 02/17/2022 02:25 PM   Modules accepted: Orders

## 2022-02-24 ENCOUNTER — Ambulatory Visit: Payer: Medicare HMO | Admitting: Family Medicine

## 2022-03-07 ENCOUNTER — Other Ambulatory Visit: Payer: Self-pay | Admitting: Internal Medicine

## 2022-03-10 ENCOUNTER — Encounter: Payer: Self-pay | Admitting: Internal Medicine

## 2022-03-10 ENCOUNTER — Ambulatory Visit (INDEPENDENT_AMBULATORY_CARE_PROVIDER_SITE_OTHER): Payer: Medicare HMO | Admitting: Internal Medicine

## 2022-03-10 VITALS — BP 116/72 | HR 86 | Temp 98.1°F | Ht 63.0 in | Wt 158.0 lb

## 2022-03-10 DIAGNOSIS — R6889 Other general symptoms and signs: Secondary | ICD-10-CM

## 2022-03-10 DIAGNOSIS — Z8719 Personal history of other diseases of the digestive system: Secondary | ICD-10-CM | POA: Diagnosis not present

## 2022-03-10 DIAGNOSIS — R0609 Other forms of dyspnea: Secondary | ICD-10-CM

## 2022-03-10 DIAGNOSIS — Z79899 Other long term (current) drug therapy: Secondary | ICD-10-CM | POA: Diagnosis not present

## 2022-03-10 DIAGNOSIS — E785 Hyperlipidemia, unspecified: Secondary | ICD-10-CM

## 2022-03-10 DIAGNOSIS — R635 Abnormal weight gain: Secondary | ICD-10-CM

## 2022-03-10 DIAGNOSIS — I10 Essential (primary) hypertension: Secondary | ICD-10-CM | POA: Diagnosis not present

## 2022-03-10 DIAGNOSIS — R739 Hyperglycemia, unspecified: Secondary | ICD-10-CM

## 2022-03-10 DIAGNOSIS — Z0000 Encounter for general adult medical examination without abnormal findings: Secondary | ICD-10-CM

## 2022-03-12 DIAGNOSIS — M67961 Unspecified disorder of synovium and tendon, right lower leg: Secondary | ICD-10-CM | POA: Diagnosis not present

## 2022-03-13 ENCOUNTER — Other Ambulatory Visit (INDEPENDENT_AMBULATORY_CARE_PROVIDER_SITE_OTHER): Payer: Medicare HMO

## 2022-03-13 DIAGNOSIS — Z8719 Personal history of other diseases of the digestive system: Secondary | ICD-10-CM

## 2022-03-13 DIAGNOSIS — R6889 Other general symptoms and signs: Secondary | ICD-10-CM

## 2022-03-13 DIAGNOSIS — R635 Abnormal weight gain: Secondary | ICD-10-CM

## 2022-03-13 DIAGNOSIS — Z79899 Other long term (current) drug therapy: Secondary | ICD-10-CM | POA: Diagnosis not present

## 2022-03-13 DIAGNOSIS — E785 Hyperlipidemia, unspecified: Secondary | ICD-10-CM

## 2022-03-13 DIAGNOSIS — R739 Hyperglycemia, unspecified: Secondary | ICD-10-CM

## 2022-03-13 DIAGNOSIS — I10 Essential (primary) hypertension: Secondary | ICD-10-CM | POA: Diagnosis not present

## 2022-03-13 LAB — BASIC METABOLIC PANEL
BUN: 26 mg/dL — ABNORMAL HIGH (ref 6–23)
CO2: 29 mEq/L (ref 19–32)
Calcium: 9.9 mg/dL (ref 8.4–10.5)
Chloride: 105 mEq/L (ref 96–112)
Creatinine, Ser: 0.83 mg/dL (ref 0.40–1.20)
GFR: 69.44 mL/min (ref >60.00)
Glucose, Bld: 87 mg/dL (ref 70–99)
Potassium: 4.2 mEq/L (ref 3.5–5.1)
Sodium: 141 mEq/L (ref 135–145)

## 2022-03-13 LAB — CBC WITH DIFFERENTIAL/PLATELET
Basophils Absolute: 0 10*3/uL (ref 0.0–0.1)
Basophils Relative: 0.6 % (ref 0.0–3.0)
Eosinophils Absolute: 0.3 10*3/uL (ref 0.0–0.7)
Eosinophils Relative: 4.1 % (ref 0.0–5.0)
HCT: 41.2 % (ref 36.0–46.0)
Hemoglobin: 13.4 g/dL (ref 12.0–15.0)
Lymphocytes Relative: 27.4 % (ref 12.0–46.0)
Lymphs Abs: 2.2 10*3/uL (ref 0.7–4.0)
MCHC: 32.6 g/dL (ref 30.0–36.0)
MCV: 96.4 fl (ref 78.0–100.0)
Monocytes Absolute: 0.5 10*3/uL (ref 0.1–1.0)
Monocytes Relative: 6.7 % (ref 3.0–12.0)
Neutro Abs: 4.9 10*3/uL (ref 1.4–7.7)
Neutrophils Relative %: 61.2 % (ref 43.0–77.0)
Platelets: 254 10*3/uL (ref 150.0–400.0)
RBC: 4.28 Mil/uL (ref 3.87–5.11)
RDW: 15.2 % (ref 11.5–15.5)
WBC: 8.1 10*3/uL (ref 4.0–10.5)

## 2022-03-13 LAB — LIPID PANEL
Cholesterol: 200 mg/dL (ref 0–200)
HDL: 59 mg/dL (ref >39.00)
LDL Cholesterol: 117 mg/dL — ABNORMAL HIGH (ref 0–99)
NonHDL: 141.01
Total CHOL/HDL Ratio: 3
Triglycerides: 120 mg/dL (ref 0.0–149.0)
VLDL: 24 mg/dL (ref 0.0–40.0)

## 2022-03-13 LAB — HEPATIC FUNCTION PANEL
ALT: 20 U/L (ref 0–35)
AST: 24 U/L (ref 0–37)
Albumin: 4.4 g/dL (ref 3.5–5.2)
Alkaline Phosphatase: 40 U/L (ref 39–117)
Bilirubin, Direct: 0.1 mg/dL (ref 0.0–0.3)
Total Bilirubin: 0.6 mg/dL (ref 0.2–1.2)
Total Protein: 7 g/dL (ref 6.0–8.3)

## 2022-03-13 LAB — HEMOGLOBIN A1C: Hgb A1c MFr Bld: 5.9 % (ref 4.6–6.5)

## 2022-03-13 LAB — TSH: TSH: 2.11 u[IU]/mL (ref 0.35–5.50)

## 2022-03-13 LAB — T4, FREE: Free T4: 0.9 ng/dL (ref 0.60–1.60)

## 2022-03-17 DIAGNOSIS — M67961 Unspecified disorder of synovium and tendon, right lower leg: Secondary | ICD-10-CM | POA: Diagnosis not present

## 2022-03-19 LAB — LIPOPROTEIN A (LPA): Lipoprotein (a): 36 nmol/L (ref <75)

## 2022-03-20 DIAGNOSIS — M67961 Unspecified disorder of synovium and tendon, right lower leg: Secondary | ICD-10-CM | POA: Diagnosis not present

## 2022-03-20 NOTE — Progress Notes (Signed)
Cholesterol improved  although would still like to have ldl under 100 ar as per cardiology advice.   No diabetes but a1c in prediabetes range .  If We order ozempic your insurance may decline .  But if you want to proceed we can order.

## 2022-03-24 ENCOUNTER — Telehealth: Payer: Self-pay | Admitting: Internal Medicine

## 2022-03-24 NOTE — Telephone Encounter (Signed)
[facility]  Pt is requesting [facility] does not have the correct dosage  Please confirm   [place], [place] Phone:  [phone]  Fax:  [phone]

## 2022-03-24 NOTE — Telephone Encounter (Signed)
Last Ov 03/10/22 Please advise

## 2022-03-26 ENCOUNTER — Encounter (HOSPITAL_BASED_OUTPATIENT_CLINIC_OR_DEPARTMENT_OTHER): Payer: Self-pay | Admitting: Cardiology

## 2022-03-26 ENCOUNTER — Other Ambulatory Visit: Payer: Self-pay

## 2022-03-26 ENCOUNTER — Telehealth: Payer: Self-pay | Admitting: Internal Medicine

## 2022-03-26 ENCOUNTER — Ambulatory Visit (HOSPITAL_BASED_OUTPATIENT_CLINIC_OR_DEPARTMENT_OTHER): Payer: Medicare HMO | Admitting: Cardiology

## 2022-03-26 VITALS — BP 124/80 | HR 73 | Ht 63.0 in | Wt 160.0 lb

## 2022-03-26 DIAGNOSIS — M67961 Unspecified disorder of synovium and tendon, right lower leg: Secondary | ICD-10-CM | POA: Diagnosis not present

## 2022-03-26 DIAGNOSIS — Z87891 Personal history of nicotine dependence: Secondary | ICD-10-CM | POA: Diagnosis not present

## 2022-03-26 DIAGNOSIS — Z9189 Other specified personal risk factors, not elsewhere classified: Secondary | ICD-10-CM | POA: Diagnosis not present

## 2022-03-26 DIAGNOSIS — I10 Essential (primary) hypertension: Secondary | ICD-10-CM | POA: Diagnosis not present

## 2022-03-26 DIAGNOSIS — E7800 Pure hypercholesterolemia, unspecified: Secondary | ICD-10-CM | POA: Diagnosis not present

## 2022-03-26 DIAGNOSIS — R0609 Other forms of dyspnea: Secondary | ICD-10-CM

## 2022-03-26 DIAGNOSIS — Z7189 Other specified counseling: Secondary | ICD-10-CM | POA: Diagnosis not present

## 2022-03-26 MED ORDER — METOPROLOL TARTRATE 25 MG PO TABS: 25.0000 mg | ORAL_TABLET | Freq: Once | ORAL | 0 refills | Status: DC

## 2022-03-26 MED ORDER — OZEMPIC (0.25 OR 0.5 MG/DOSE) 2 MG/3ML ~~LOC~~ SOPN: 0.5000 [IU] | PEN_INJECTOR | SUBCUTANEOUS | 0 refills | Status: DC

## 2022-03-26 NOTE — Telephone Encounter (Signed)
Last Ov 03/10/22 Last lab 03/13/22 Please advise

## 2022-03-26 NOTE — Telephone Encounter (Signed)
Pt called again and would like med to go to  [place], [place] - [address] Phone:  [phone]  Fax:  [phone]

## 2022-03-26 NOTE — Telephone Encounter (Signed)
Pt is calling checking on status of ozempic. Pt is aware refill can take up to 3 business days

## 2022-03-26 NOTE — Telephone Encounter (Signed)
Rx was sent to the pharmacy  

## 2022-03-26 NOTE — Telephone Encounter (Signed)
She is prediabetic   please   loo  up that code and send to them

## 2022-03-26 NOTE — Telephone Encounter (Signed)
[place] with [place] [facility] called to say they received the Ozempic Rx called in today but need an ICD code  Please advise [phone]

## 2022-03-26 NOTE — Progress Notes (Signed)
Cardiology Office Note:    Date:  03/26/2022   ID:  Shelley Wright, DOB 24-Sep-1945, MRN [id number]  PCP:  Burnis Medin, MD  Cardiologist:  Buford Dresser, MD  Referring MD: Burnis Medin, MD   CC: new consult for decreased exercise tolerance  History of Present Illness:    Shelley Wright is a 76 y.o. female with a hx of hypertension, hyperlipidemia, heart murmur, ulcerative colitis, GERD, arthritis, who is seen as a new consult at the request of Panosh, Standley Brooking, MD for the evaluation and management of decreased exercise tolerance.  She saw her PCP Dr. Regis Wright on 03/10/2022 where she continued to have dyspnea on exertion. It was also noted she had been struggling with hip and extremity bursitis and was undergoing PT. Some family history of heart disease. Although her exercise intolerance was not recently acute it has been significant, so she was referred to cardiology for further evaluation of possible cardiac etiologies.  Cardiovascular risk factors: Prior clinical ASCVD: None Comorbid conditions: Hypertension-Controlled on medication for a long time.  Hyperlipidemia.  Metabolic syndrome/Obesity: Her highest adult weight is her current weight, which is 160 lbs. Chronic inflammatory conditions: Ulcerative colitis, she reports this is controlled. At her previous colonoscopy she was recommended for 3 year follow-up. Tobacco use history: She quit smoking about 10 years ago. Family history: Her mother had angina. She has siblings that are in good cardiovascular health. No known history of stroke. Prior cardiac testing and/or incidental findings on other testing (ie coronary calcium): Exercise level: Significantly limited by bursitis in her hip, and knee and foot issues. She does not formally exercise at this time. With activity, her shortness of breath may begin as she is climbing a flight of stairs. She denies any shortness of breath with sitting still; no PND. Current diet: Tries to  eat healthy. A lot of chicken and some red meat. Also enjoys pasta.  Her main concern is that she becomes short winded very easily. This has been ongoing for about 6 months. Typically she is unable to walk down the block without being short of breath. Previously she was able to walk up to 3 miles with no issues. May become short of breath with talking too much. She has also noticed that her balance seems to be worsening as well.   Past Medical History:  Diagnosis Date   Anxiety    no per pt   Arthritis    GERD (gastroesophageal reflux disease)    Heart murmur    'YEARS AGO"   History of smoking    History of ulcerative colitis 2005   pancolitis    Hyperlipidemia    Hypertension    Normal nuclear stress test 2008   myoview     Past Surgical History:  Procedure Laterality Date   BREAST BIOPSY Left 1986   biopsy x 2    COLONOSCOPY     KNEE SURGERY Left october   x 2    Current Medications: Current Outpatient Medications on File Prior to Visit  Medication Sig   escitalopram (LEXAPRO) 10 MG tablet Take 10 mg by mouth every other day.   diclofenac (VOLTAREN) 75 MG EC tablet diclofenac sodium 75 mg tablet,delayed release   lisinopril (ZESTRIL) 10 MG tablet TAKE 1 TABLET EVERY DAY   rosuvastatin (CRESTOR) 20 MG tablet Take 1 tablet (20 mg total) by mouth daily.   No current facility-administered medications on file prior to visit.     Allergies:  Patient has no known allergies.   Social History   Tobacco Use   Smoking status: Former    Types: Cigarettes    Quit date: 07/04/2014    Years since quitting: 7.7   Smokeless tobacco: Never  Vaping Use   Vaping Use: Never used  Substance Use Topics   Alcohol use: No    Alcohol/week: 0.0 standard drinks of alcohol   Drug use: No    Family History: family history includes Angina in her mother; Breast cancer (age of onset: 34) in her mother; Hypertension in her father and mother; Pancreatic cancer in her father. There is no  history of Colon cancer, Colon polyps, Rectal cancer, or Stomach cancer.  ROS:   Please see the history of present illness.  Additional pertinent ROS: Constitutional: Negative for chills, fever, night sweats, unintentional weight loss. Positive for imbalance. HENT: Negative for ear pain and hearing loss.   Eyes: Negative for loss of vision and eye pain.  Respiratory: Negative for cough, sputum, wheezing. Positive for shortness of breath. Cardiovascular: See HPI. Gastrointestinal: Negative for abdominal pain, melena, and hematochezia.  Genitourinary: Negative for dysuria and hematuria.  Musculoskeletal: Negative for falls. Positive for bursitis of hip, Positive for knee and foot pain.  Skin: Negative for itching and rash.  Neurological: Negative for focal weakness, focal sensory changes and loss of consciousness.  Endo/Heme/Allergies: Does not bruise/bleed easily.     EKGs/Labs/Other Studies Reviewed:    The following studies were reviewed today:  Chest X-Ray  06/24/2021: FINDINGS: Diffuse peribronchial cuffing. Lung volumes are normal. No consolidative airspace disease. No pleural effusions. No pneumothorax. No pulmonary nodule or mass noted. Pulmonary vasculature and the cardiomediastinal silhouette are within normal limits.   IMPRESSION: 1. Diffuse peribronchial cuffing concerning for an acute bronchitis.   EKG:  EKG is personally reviewed.   03/26/2022: NSR at 73 bpm  Recent Labs: 03/13/2022: ALT 20; BUN 26; Creatinine, Ser 0.83; Hemoglobin 13.4; Platelets 254.0; Potassium 4.2; Sodium 141; TSH 2.11   Recent Lipid Panel    Component Value Date/Time   CHOL 200 03/13/2022 0944   TRIG 120.0 03/13/2022 0944   HDL 59.00 03/13/2022 0944   CHOLHDL 3 03/13/2022 0944   VLDL 24.0 03/13/2022 0944   LDLCALC 117 (H) 03/13/2022 0944   LDLDIRECT 138.5 10/17/2013 0817    Physical Exam:    VS:  BP 124/80   Pulse 73   Ht 5' 3"  (1.6 m)   Wt 160 lb (72.6 kg)   BMI 28.34 kg/m      Wt Readings from Last 3 Encounters:  03/26/22 160 lb (72.6 kg)  03/10/22 158 lb (71.7 kg)  02/17/22 150 lb (68 kg)    GEN: Well nourished, well developed in no acute distress HEENT: Normal, moist mucous membranes NECK: No JVD CARDIAC: regular rhythm, normal S1 and S2, no rubs or gallops. No murmur. VASCULAR: Radial and DP pulses 2+ bilaterally. No carotid bruits RESPIRATORY:  Clear to auscultation without rales, wheezing or rhonchi  ABDOMEN: Soft, non-tender, non-distended MUSCULOSKELETAL:  Ambulates independently SKIN: Warm and dry, no edema NEUROLOGIC:  Alert and oriented x 3. No focal neuro deficits noted. PSYCHIATRIC:  Normal affect    ASSESSMENT:    1. Dyspnea on exertion   2. At increased risk for cardiovascular disease   3. Essential hypertension   4. Pure hypercholesterolemia   5. Former tobacco use    PLAN:    Dyspnea on exertion CV risk, including hypertension, hypercholesterolemia, elevated ASCVD score, former tobacco use -  ordered echocardiogram to rule out structural cardiac cause -discussed treadmill stress, nuclear stress/lexiscan, and CT coronary angiography. Discussed pros and cons of each, including but not limited to false positive/false negative risk, radiation risk, and risk of IV contrast dye. Based on shared decision making, decision was made to pursue CT coronary angiography. -will give one time dose of metoprolol 2 hours prior to scheduled test -counseled on need to get BMET prior to test -counseled on use of sublingual nitroglycerin and its importance to a good test  -if testing unremarkable, this would suggest a noncardiac cause  Cardiac risk counseling and prevention recommendations: -recommend heart healthy/Mediterranean diet, with whole grains, fruits, vegetable, fish, lean meats, nuts, and olive oil. Limit salt. -recommend moderate walking, 3-5 times/week for 30-50 minutes each session. Aim for at least 150 minutes.week. Goal should be pace of 3  miles/hours, or walking 1.5 miles in 30 minutes -recommend avoidance of tobacco products. Avoid excess alcohol. -ASCVD risk score: The 10-year ASCVD risk score (Arnett DK, et al., 2019) is: 17.9%   Values used to calculate the score:     Age: 43 years     Sex: Female     Is Non-Hispanic African American: No     Diabetic: No     Tobacco smoker: No     Systolic Blood Pressure: 725 mmHg     Is BP treated: Yes     HDL Cholesterol: 59 mg/dL     Total Cholesterol: 200 mg/dL    Plan for follow up: TBD based on results of testing.  Buford Dresser, MD, PhD, [facility]    Medication Adjustments/Labs and Tests Ordered: Current medicines are reviewed at length with the patient today.  Concerns regarding medicines are outlined above.   Orders Placed This Encounter  Procedures   CT CORONARY MORPH W/CTA COR W/SCORE W/CA W/CM &/OR WO/CM   EKG 12-Lead   ECHOCARDIOGRAM COMPLETE   Meds ordered this encounter  Medications   metoprolol tartrate (LOPRESSOR) 25 MG tablet    Sig: Take 1 tablet (25 mg total) by mouth once for 1 dose. Take 2 hours prior to Cardiac CTA    Dispense:  1 tablet    Refill:  0   Patient Instructions  Medication Instructions:  Your Physician recommend you continue on your current medication as directed.    *If you need a refill on your cardiac medications before your next appointment, please call your pharmacy*   Lab Work: None ordered today   Testing/Procedures: Your physician has requested that you have an echocardiogram. Echocardiography is a painless test that uses sound waves to create images of your heart. It provides your doctor with information about the size and shape of your heart and how well your heart's chambers and valves are working. This procedure takes approximately one hour. There are no restrictions for this procedure. [place]  Cardiac CT Angiography (CTA), is a special type of CT scan that  uses a computer to produce multi-dimensional views of major blood vessels throughout the body. In CT angiography, a contrast material is injected through an IV to help visualize the blood vessels [facility]    Follow-Up: At [facility], you and your health needs are our priority.  As part of our continuing mission to provide you with exceptional heart care, we have created designated Provider Care Teams.  These Care Teams include your primary Cardiologist (physician) and Advanced Practice Providers (APPs -  Physician Assistants and  Nurse Practitioners) who all work together to provide you with the care you need, when you need it.  We recommend signing up for the patient portal called "MyChart".  Sign up information is provided on this After Visit Summary.  MyChart is used to connect with patients for Virtual Visits (Telemedicine).  Patients are able to view lab/test results, encounter notes, upcoming appointments, etc.  Non-urgent messages can be sent to your provider as well.   To learn more about what you can do with MyChart, go to [email or web address].    Your next appointment:   Based on test results  The format for your next appointment:   In Person  Provider:   Buford Dresser, MD{    Your cardiac CT will be scheduled at one of the below locations:   [facility] [address] [place], [place] [address] [phone]  If scheduled at [facility], please arrive at the [facility] and Children's Entrance (Entrance C2) of [facility] 30 minutes prior to test start time. You can use the FREE valet parking offered at entrance C (encouraged to control the heart rate for the test)  Proceed to the [facility] Radiology Department (first floor) to check-in and test prep.  All radiology patients and guests should use entrance C2 at [facility], accessed from [facility], even though the hospital's physical address listed is  [address]    If scheduled at [facility], please arrive 15 mins early for check-in and test prep.  Please follow these instructions carefully (unless otherwise directed):  On the Night Before the Test: Be sure to Drink plenty of water. Do not consume any caffeinated/decaffeinated beverages or chocolate 12 hours prior to your test. Do not take any antihistamines 12 hours prior to your test.  On the Day of the Test: Drink plenty of water until 1 hour prior to the test. Do not eat any food 4 hours prior to the test. You may take your regular medications prior to the test.  Take metoprolol (Lopressor) 25 mg two hours prior to test. FEMALES- please wear underwire-free bra if available, avoid dresses & tight clothin        After the Test: Drink plenty of water. After receiving IV contrast, you may experience a mild flushed feeling. This is normal. On occasion, you may experience a mild rash up to 24 hours after the test. This is not dangerous. If this occurs, you can take Benadryl 25 mg and increase your fluid intake. If you experience trouble breathing, this can be serious. If it is severe call 911 IMMEDIATELY. If it is mild, please call our office. If you take any of these medications: Glipizide/Metformin, Avandament, Glucavance, please do not take 48 hours after completing test unless otherwise instructed.  We will call to schedule your test 2-4 weeks out understanding that some insurance companies will need an authorization prior to the service being performed.   For non-scheduling related questions, please contact the cardiac imaging nurse navigator should you have any questions/concerns: Marchia Bond, Cardiac Imaging Nurse Navigator Gordy Clement, Cardiac Imaging Nurse Navigator [facility] Direct Office Dial: [phone]   For scheduling needs, including cancellations and rescheduling, please call [place],  [phone].        I,Mathew Stumpf,acting as a Education administrator for [employer], MD.,have documented all relevant documentation on the behalf of Buford Dresser, MD,as directed by  Buford Dresser, MD while in the presence of  Buford Dresser, MD.  I, Buford Dresser, MD, have reviewed all documentation for this visit. The documentation on 05/07/22 for the exam, diagnosis, procedures, and orders are all accurate and complete.   Signed, Buford Dresser, MD PhD 03/26/2022     [place]

## 2022-03-26 NOTE — Patient Instructions (Addendum)
Medication Instructions:  Your Physician recommend you continue on your current medication as directed.    *If you need a refill on your cardiac medications before your next appointment, please call your pharmacy*   Lab Work: None ordered today   Testing/Procedures: Your physician has requested that you have an echocardiogram. Echocardiography is a painless test that uses sound waves to create images of your heart. It provides your doctor with information about the size and shape of your heart and how well your heart's chambers and valves are working. This procedure takes approximately one hour. There are no restrictions for this procedure. [place]  Cardiac CT Angiography (CTA), is a special type of CT scan that uses a computer to produce multi-dimensional views of major blood vessels throughout the body. In CT angiography, a contrast material is injected through an IV to help visualize the blood vessels [facility]    Follow-Up: At [facility], you and your health needs are our priority.  As part of our continuing mission to provide you with exceptional heart care, we have created designated Provider Care Teams.  These Care Teams include your primary Cardiologist (physician) and Advanced Practice Providers (APPs -  Physician Assistants and Nurse Practitioners) who all work together to provide you with the care you need, when you need it.  We recommend signing up for the patient portal called "MyChart".  Sign up information is provided on this After Visit Summary.  MyChart is used to connect with patients for Virtual Visits (Telemedicine).  Patients are able to view lab/test results, encounter notes, upcoming appointments, etc.  Non-urgent messages can be sent to your provider as well.   To learn more about what you can do with MyChart, go to [email or web address].    Your next appointment:   Based on test results  The format for your next  appointment:   In Person  Provider:   Buford Dresser, MD{    Your cardiac CT will be scheduled at one of the below locations:   [facility] [address] [place], [place] [address] [phone]  If scheduled at [facility], please arrive at the [facility] and Children's Entrance (Entrance C2) of [facility] 30 minutes prior to test start time. You can use the FREE valet parking offered at entrance C (encouraged to control the heart rate for the test)  Proceed to the [facility] Radiology Department (first floor) to check-in and test prep.  All radiology patients and guests should use entrance C2 at [facility], accessed from [facility], even though the hospital's physical address listed is [address].    If scheduled at [facility], please arrive 15 mins early for check-in and test prep.  Please follow these instructions carefully (unless otherwise directed):  On the Night Before the Test: Be sure to Drink plenty of water. Do not consume any caffeinated/decaffeinated beverages or chocolate 12 hours prior to your test. Do not take any antihistamines 12 hours prior to your test.  On the Day of the Test: Drink plenty of water until 1 hour prior to the test. Do not eat any food 4 hours prior to the test. You may take your regular medications prior to the test.  Take metoprolol (Lopressor) 25 mg two hours prior to test. FEMALES- please wear underwire-free bra if available, avoid dresses & tight clothin        After the Test: Drink plenty of  water. After receiving IV contrast, you may experience a mild flushed feeling. This is normal. On occasion, you may experience a mild rash up to 24 hours after the test. This is not dangerous. If this occurs, you can take Benadryl 25 mg and increase your fluid intake. If you experience trouble breathing, this can be serious. If it is severe call 911  IMMEDIATELY. If it is mild, please call our office. If you take any of these medications: Glipizide/Metformin, Avandament, Glucavance, please do not take 48 hours after completing test unless otherwise instructed.  We will call to schedule your test 2-4 weeks out understanding that some insurance companies will need an authorization prior to the service being performed.   For non-scheduling related questions, please contact the cardiac imaging nurse navigator should you have any questions/concerns: Marchia Bond, Cardiac Imaging Nurse Navigator Gordy Clement, Cardiac Imaging Nurse Navigator [facility] Direct Office Dial: [phone]   For scheduling needs, including cancellations and rescheduling, please call [place], [phone].

## 2022-03-28 ENCOUNTER — Other Ambulatory Visit: Payer: Self-pay

## 2022-03-28 DIAGNOSIS — R7303 Prediabetes: Secondary | ICD-10-CM

## 2022-03-28 MED ORDER — OZEMPIC (0.25 OR 0.5 MG/DOSE) 2 MG/3ML ~~LOC~~ SOPN: 0.5000 [IU] | PEN_INJECTOR | SUBCUTANEOUS | 0 refills | Status: DC

## 2022-03-28 NOTE — Telephone Encounter (Signed)
Rx sent in with ICD code. Code also given to the pharmacist. No further action needed.

## 2022-04-01 ENCOUNTER — Other Ambulatory Visit: Payer: Self-pay | Admitting: Internal Medicine

## 2022-04-01 DIAGNOSIS — R7303 Prediabetes: Secondary | ICD-10-CM

## 2022-04-07 ENCOUNTER — Other Ambulatory Visit: Payer: Self-pay

## 2022-04-07 ENCOUNTER — Telehealth: Payer: Self-pay | Admitting: Internal Medicine

## 2022-04-07 DIAGNOSIS — R7303 Prediabetes: Secondary | ICD-10-CM

## 2022-04-07 MED ORDER — OZEMPIC (0.25 OR 0.5 MG/DOSE) 2 MG/3ML ~~LOC~~ SOPN: 0.5000 [IU] | PEN_INJECTOR | SUBCUTANEOUS | 0 refills | Status: DC

## 2022-04-07 NOTE — Telephone Encounter (Signed)
Rx sent to pharmacy   

## 2022-04-07 NOTE — Telephone Encounter (Signed)
Pt requesting prescription Semaglutide,0.25 or 0.5MG/DOS, (OZEMPIC, 0.25 OR 0.5 MG/DOSE,) 2 MG/3ML SOPN be sent to  [place], [place] Phone:  [phone]  Fax:  [phone]

## 2022-04-09 ENCOUNTER — Other Ambulatory Visit: Payer: Self-pay

## 2022-04-09 DIAGNOSIS — R7303 Prediabetes: Secondary | ICD-10-CM

## 2022-04-09 MED ORDER — LISINOPRIL 10 MG PO TABS: 10.0000 mg | ORAL_TABLET | Freq: Every day | ORAL | 1 refills | Status: DC

## 2022-04-09 MED ORDER — OZEMPIC (0.25 OR 0.5 MG/DOSE) 2 MG/3ML ~~LOC~~ SOPN: 0.5000 [IU] | PEN_INJECTOR | SUBCUTANEOUS | 0 refills | Status: DC

## 2022-04-09 NOTE — Telephone Encounter (Signed)
PA approved Rx sent to the pharmacy

## 2022-04-09 NOTE — Telephone Encounter (Signed)
PA started  KEY: [id number]

## 2022-04-10 ENCOUNTER — Other Ambulatory Visit: Payer: Self-pay

## 2022-04-10 DIAGNOSIS — R7303 Prediabetes: Secondary | ICD-10-CM

## 2022-04-10 MED ORDER — OZEMPIC (0.25 OR 0.5 MG/DOSE) 2 MG/3ML ~~LOC~~ SOPN: 0.5000 [IU] | PEN_INJECTOR | SUBCUTANEOUS | 0 refills | Status: DC

## 2022-04-22 ENCOUNTER — Ambulatory Visit (INDEPENDENT_AMBULATORY_CARE_PROVIDER_SITE_OTHER): Payer: Medicare HMO

## 2022-04-22 DIAGNOSIS — R0609 Other forms of dyspnea: Secondary | ICD-10-CM

## 2022-04-22 LAB — ECHOCARDIOGRAM COMPLETE
Area-P 1/2: 3.72 cm2
P 1/2 time: 721 msec
S' Lateral: 2.35 cm

## 2022-05-01 ENCOUNTER — Telehealth: Payer: Self-pay | Admitting: Internal Medicine

## 2022-05-01 DIAGNOSIS — R7303 Prediabetes: Secondary | ICD-10-CM

## 2022-05-01 NOTE — Telephone Encounter (Signed)
Pt requesting refill of Semaglutide,0.25 or 0.5MG/DOS, (OZEMPIC, 0.25 OR 0.5 MG/DOSE,) 2 MG/3ML [place], [place] Phone:  [phone]  Fax:  [phone]

## 2022-05-02 NOTE — Telephone Encounter (Signed)
Pt called to FU on the refill for the  Semaglutide,0.25 or 0.5MG/DOS, (OZEMPIC, 0.25 OR 0.5 MG/DOSE,) 2 MG/3ML SOPN    Pt stated that [facility] told her they are still waiting for the PA.  LOV: 03/10/2022  [place], [place] Phone:  [phone]  Fax:  [phone]      Please advise.

## 2022-05-05 MED ORDER — OZEMPIC (0.25 OR 0.5 MG/DOSE) 2 MG/3ML ~~LOC~~ SOPN: 0.5000 [IU] | PEN_INJECTOR | SUBCUTANEOUS | 0 refills | Status: DC

## 2022-05-05 NOTE — Addendum Note (Signed)
Addended byEncarnacion Slates on: 05/05/2022 04:22 PM   Modules accepted: Orders

## 2022-05-05 NOTE — Telephone Encounter (Signed)
Rx sent.  Video visit schedule on 05/26/2022. Inform patient we will need weight and BP reading.

## 2022-05-05 NOTE — Telephone Encounter (Signed)
Can refill ozempic same dosing 0.5 mg but then needs visit  virtual ok if can give weight and BP readings

## 2022-05-22 ENCOUNTER — Other Ambulatory Visit (HOSPITAL_COMMUNITY): Payer: Medicare HMO

## 2022-05-25 ENCOUNTER — Other Ambulatory Visit: Payer: Self-pay | Admitting: Internal Medicine

## 2022-05-25 DIAGNOSIS — R7303 Prediabetes: Secondary | ICD-10-CM

## 2022-05-26 ENCOUNTER — Telehealth (INDEPENDENT_AMBULATORY_CARE_PROVIDER_SITE_OTHER): Payer: Medicare HMO | Admitting: Internal Medicine

## 2022-05-26 ENCOUNTER — Encounter: Payer: Self-pay | Admitting: Internal Medicine

## 2022-05-26 VITALS — BP 112/80 | Ht 63.75 in | Wt 150.0 lb

## 2022-05-26 DIAGNOSIS — I10 Essential (primary) hypertension: Secondary | ICD-10-CM

## 2022-05-26 DIAGNOSIS — Z79899 Other long term (current) drug therapy: Secondary | ICD-10-CM | POA: Diagnosis not present

## 2022-05-26 DIAGNOSIS — R7303 Prediabetes: Secondary | ICD-10-CM

## 2022-05-26 MED ORDER — OZEMPIC (0.25 OR 0.5 MG/DOSE) 2 MG/3ML ~~LOC~~ SOPN: 0.5000 [IU] | PEN_INJECTOR | SUBCUTANEOUS | 1 refills | Status: AC

## 2022-05-26 NOTE — Patient Instructions (Addendum)
Stay on 0.5 mg per week with 1 refill   Then plan fu visit runs out   Expectant management.  Almost at goal bmi.     BP is good.

## 2022-05-26 NOTE — Progress Notes (Addendum)
Virtual Visit via Video Note  I connected with Shelley Wright on 05/26/22 at  3:00 PM EDT by a video enabled telemedicine application and verified that I am speaking with the correct person using two identifiers. Location patient: home Location provider:work  office Persons participating in the virtual visit: patient, provider initial visual and then finish phone  Patient aware  of the limitations of evaluation and management by telemedicine and  availability of in person appointments. and agreed to proceed.   HPI: Shelley Wright presents for video visit   has been on initial ozepmic and now on 05. G without se noted  after 1 week   says still eating but has decrease portion sized  is satisfying  Bp good  on meds  Still on hld. Is taking lexapro every other day for now  Did see cardiologist  7 23  ROS: See pertinent positives and negatives per HPI.  Past Medical History:  Diagnosis Date   Anxiety    no per pt   Arthritis    GERD (gastroesophageal reflux disease)    Heart murmur    'YEARS AGO"   History of smoking    History of ulcerative colitis 2005   pancolitis    Hyperlipidemia    Hypertension    Normal nuclear stress test 2008   myoview     Past Surgical History:  Procedure Laterality Date   BREAST BIOPSY Left 1986   biopsy x 2    COLONOSCOPY     KNEE SURGERY Left october   x 2    Family History  Problem Relation Age of Onset   Pancreatic cancer Father        deceased   Hypertension Father    Breast cancer Mother 34   Angina Mother    Hypertension Mother    Colon cancer Neg Hx    Colon polyps Neg Hx    Rectal cancer Neg Hx    Stomach cancer Neg Hx     Social History   Tobacco Use   Smoking status: Former    Types: Cigarettes    Quit date: 07/04/2014    Years since quitting: 7.8   Smokeless tobacco: Never  Vaping Use   Vaping Use: Never used  Substance Use Topics   Alcohol use: No    Alcohol/week: 0.0 standard drinks of alcohol   Drug use:  No      Current Outpatient Medications:    escitalopram (LEXAPRO) 10 MG tablet, Take 10 mg by mouth every other day., Disp: , Rfl:    lisinopril (ZESTRIL) 10 MG tablet, Take 1 tablet (10 mg total) by mouth daily., Disp: 60 tablet, Rfl: 1   rosuvastatin (CRESTOR) 20 MG tablet, Take 1 tablet (20 mg total) by mouth daily., Disp: 90 tablet, Rfl: 0   diclofenac (VOLTAREN) 75 MG EC tablet, diclofenac sodium 75 mg tablet,delayed release (Patient not taking: Reported on 05/26/2022), Disp: , Rfl:    Semaglutide,0.25 or 0.5MG/DOS, (OZEMPIC, 0.25 OR 0.5 MG/DOSE,) 2 MG/3ML SOPN, Inject 0.5 Units into the skin once a week., Disp: 3 mL, Rfl: 1  EXAM: BP Readings from Last 3 Encounters:  05/26/22 112/80  03/26/22 124/80  03/10/22 116/72   Wt Readings from Last 3 Encounters:  05/26/22 150 lb (68 kg)  03/26/22 160 lb (72.6 kg)  03/10/22 158 lb (71.7 kg)     VITALS per patient if applicable:  GENERAL: alert, oriented, appears well and in no acute distress  HEENT: atraumatic, conjunttiva clear,  no obvious abnormalities on inspection of external nose and ears  NECK: normal movements of the head and neck  LUNGS: on inspection no signs of respiratory distress, breathing rate appears normal, no obvious gross SOB, gasping or wheezing  CV: no obvious cyanosis  PSYCH/NEURO: pleasant and cooperative, no obvious depression or anxiety, speech and thought processing grossly intact Lab Results  Component Value Date   WBC 8.1 03/13/2022   HGB 13.4 03/13/2022   HCT 41.2 03/13/2022   PLT 254.0 03/13/2022   GLUCOSE 87 03/13/2022   CHOL 200 03/13/2022   TRIG 120.0 03/13/2022   HDL 59.00 03/13/2022   LDLDIRECT 138.5 10/17/2013   LDLCALC 117 (H) 03/13/2022   ALT 20 03/13/2022   AST 24 03/13/2022   NA 141 03/13/2022   K 4.2 03/13/2022   CL 105 03/13/2022   CREATININE 0.83 03/13/2022   BUN 26 (H) 03/13/2022   CO2 29 03/13/2022   TSH 2.11 03/13/2022   HGBA1C 5.9 03/13/2022    ASSESSMENT AND  PLAN:  Discussed the following assessment and plan:    ICD-10-CM   1. Medication management  Z79.899     2. Pre-diabetes  R73.03 Semaglutide,0.25 or 0.5MG/DOS, (OZEMPIC, 0.25 OR 0.5 MG/DOSE,) 2 MG/3ML SOPN    3. Essential hypertension  I10     Elevated bmi better   Disc risk benefit of medication  stay on 0.5 mg and plan fu in 2-3 mos before runs out.  Can check a1c at that time and weight etc,  Counseled.   Expectant management and discussion of plan and treatment with opportunity to ask questions and all were answered. The patient agreed with the plan and demonstrated an understanding of the instructions.   Advised to call back or seek an in-person evaluation if worsening  or having  further concerns  in interim. Return for 2 mos or therabouts before rusn out of medication.    Shanon Ace, MD

## 2022-05-28 ENCOUNTER — Telehealth (HOSPITAL_COMMUNITY): Payer: Self-pay | Admitting: *Deleted

## 2022-05-28 DIAGNOSIS — H5211 Myopia, right eye: Secondary | ICD-10-CM | POA: Diagnosis not present

## 2022-05-28 DIAGNOSIS — Z961 Presence of intraocular lens: Secondary | ICD-10-CM | POA: Diagnosis not present

## 2022-05-28 NOTE — Telephone Encounter (Signed)
Reaching out to patient to offer assistance regarding upcoming cardiac imaging study; pt verbalizes understanding of appt date/time, parking situation and where to check in, medications ordered, and verified current allergies; name and call back number provided for further questions should they arise  Gordy Clement RN Navigator Cardiac [facility] [phone] office [phone] cell  Patient to take 59m metoprolol tartrate TWO hours prior to her cardiac CT scan. She is aware to arrive at 11:30am.

## 2022-05-29 ENCOUNTER — Other Ambulatory Visit: Payer: Self-pay | Admitting: Cardiology

## 2022-05-29 ENCOUNTER — Ambulatory Visit (HOSPITAL_BASED_OUTPATIENT_CLINIC_OR_DEPARTMENT_OTHER)
Admission: RE | Admit: 2022-05-29 | Discharge: 2022-05-29 | Disposition: A | Discharge status: Home / Self Care | Payer: Medicare HMO | Source: Ambulatory Visit | Attending: Cardiology | Admitting: Cardiology

## 2022-05-29 ENCOUNTER — Ambulatory Visit (HOSPITAL_COMMUNITY)
Admission: RE | Admit: 2022-05-29 | Discharge: 2022-05-29 | Disposition: A | Discharge status: Home / Self Care | Payer: Medicare HMO | Source: Ambulatory Visit | Attending: Cardiology | Admitting: Cardiology

## 2022-05-29 DIAGNOSIS — Z9189 Other specified personal risk factors, not elsewhere classified: Secondary | ICD-10-CM | POA: Insufficient documentation

## 2022-05-29 DIAGNOSIS — R931 Abnormal findings on diagnostic imaging of heart and coronary circulation: Secondary | ICD-10-CM

## 2022-05-29 DIAGNOSIS — I2510 Atherosclerotic heart disease of native coronary artery without angina pectoris: Secondary | ICD-10-CM | POA: Diagnosis not present

## 2022-05-29 DIAGNOSIS — I700 Atherosclerosis of aorta: Secondary | ICD-10-CM | POA: Insufficient documentation

## 2022-05-29 MED ORDER — NITROGLYCERIN 0.4 MG SL SUBL
0.8000 mg | SUBLINGUAL_TABLET | Freq: Once | SUBLINGUAL | Status: AC
  Administered 2022-05-29: 0.8 mg via SUBLINGUAL

## 2022-05-29 MED ORDER — IOHEXOL 350 MG/ML SOLN
95.0000 mL | Freq: Once | INTRAVENOUS | Status: AC | PRN
  Administered 2022-05-29: 95 mL via INTRAVENOUS

## 2022-05-29 MED ORDER — NITROGLYCERIN 0.4 MG SL SUBL
SUBLINGUAL_TABLET | SUBLINGUAL | Status: AC
  Filled 2022-05-29: qty 2

## 2022-06-16 ENCOUNTER — Encounter (HOSPITAL_BASED_OUTPATIENT_CLINIC_OR_DEPARTMENT_OTHER): Payer: Self-pay | Admitting: Cardiology

## 2022-06-16 ENCOUNTER — Ambulatory Visit (HOSPITAL_BASED_OUTPATIENT_CLINIC_OR_DEPARTMENT_OTHER): Payer: Medicare HMO | Admitting: Cardiology

## 2022-06-16 VITALS — BP 110/70 | HR 87 | Ht 63.75 in | Wt 150.9 lb

## 2022-06-16 DIAGNOSIS — I2510 Atherosclerotic heart disease of native coronary artery without angina pectoris: Secondary | ICD-10-CM | POA: Diagnosis not present

## 2022-06-16 DIAGNOSIS — E7800 Pure hypercholesterolemia, unspecified: Secondary | ICD-10-CM

## 2022-06-16 DIAGNOSIS — E7801 Familial hypercholesterolemia: Secondary | ICD-10-CM | POA: Diagnosis not present

## 2022-06-16 DIAGNOSIS — Z7189 Other specified counseling: Secondary | ICD-10-CM

## 2022-06-16 DIAGNOSIS — Z8249 Family history of ischemic heart disease and other diseases of the circulatory system: Secondary | ICD-10-CM | POA: Diagnosis not present

## 2022-06-16 DIAGNOSIS — Z713 Dietary counseling and surveillance: Secondary | ICD-10-CM | POA: Diagnosis not present

## 2022-06-16 MED ORDER — ROSUVASTATIN CALCIUM 40 MG PO TABS: 40.0000 mg | ORAL_TABLET | Freq: Every day | ORAL | 3 refills | Status: DC

## 2022-06-16 MED ORDER — ASPIRIN 81 MG PO TBEC: 81.0000 mg | DELAYED_RELEASE_TABLET | Freq: Every day | ORAL | 3 refills | Status: DC

## 2022-06-16 MED ORDER — EZETIMIBE 10 MG PO TABS: 10.0000 mg | ORAL_TABLET | Freq: Every day | ORAL | 3 refills | Status: DC

## 2022-06-16 NOTE — Progress Notes (Signed)
Cardiology Office Note:    Date:  06/16/2022   ID:  RENALDA Wright, DOB Aug 31, 1946, MRN [id number]  PCP:  Burnis Medin, MD  Cardiologist:  Buford Dresser, MD  Referring MD: Burnis Medin, MD   CC: follow up  History of Present Illness:    Shelley Wright is a 76 y.o. female with a hx of hypertension, hyperlipidemia, heart murmur, ulcerative colitis, GERD, arthritis, who is seen for follow up today. I initially met her 03/26/22 as a new consult at the request of Panosh, Standley Brooking, MD for the evaluation and management of decreased exercise tolerance.  Cardiovascular risk factors: Prior clinical ASCVD: None Comorbid conditions: Hypertension-Controlled on medication for a long time.  Hyperlipidemia.  Metabolic syndrome/Obesity: Her highest adult weight is her current weight, which is 160 lbs. Chronic inflammatory conditions: Ulcerative colitis, she reports this is controlled. At her previous colonoscopy she was recommended for 3 year follow-up. Tobacco use history: She quit smoking about 10 years ago. Family history: Her mother had angina. She has siblings that are in good cardiovascular health. No known history of stroke. Prior cardiac testing and/or incidental findings on other testing (ie coronary calcium): Exercise level: Significantly limited by bursitis in her hip, and knee and foot issues. She does not formally exercise at this time. With activity, her shortness of breath may begin as she is climbing a flight of stairs. She denies any shortness of breath with sitting still; no PND. Current diet: Tries to eat healthy. A lot of chicken and some red meat. Also enjoys pasta.  Today:  She overall feels well. We reviewed the results of her cardiac CT at length today. Her score is Calcium Score is elevated at 1191 and she has plaque noted in multiple vessels. Discussed recommendations for management based on this.  She has bursitis in her left hip, and tendonitis in her feet. This  prevents her from being able to exercise much.   She is compliant with her Crestor.  She denies any palpitations, chest pain, shortness of breath, or peripheral edema. No lightheadedness, headaches, syncope, orthopnea, or PND.    Past Medical History:  Diagnosis Date   Anxiety    no per pt   Arthritis    GERD (gastroesophageal reflux disease)    Heart murmur    'YEARS AGO"   History of smoking    History of ulcerative colitis 2005   pancolitis    Hyperlipidemia    Hypertension    Normal nuclear stress test 2008   myoview     Past Surgical History:  Procedure Laterality Date   BREAST BIOPSY Left 1986   biopsy x 2    COLONOSCOPY     KNEE SURGERY Left october   x 2    Current Medications: Current Outpatient Medications on File Prior to Visit  Medication Sig   diclofenac (VOLTAREN) 75 MG EC tablet diclofenac sodium 75 mg tablet,delayed release (Patient not taking: Reported on 05/26/2022)   escitalopram (LEXAPRO) 10 MG tablet Take 10 mg by mouth every other day.   lisinopril (ZESTRIL) 10 MG tablet Take 1 tablet (10 mg total) by mouth daily.   rosuvastatin (CRESTOR) 20 MG tablet Take 1 tablet (20 mg total) by mouth daily.   Semaglutide,0.25 or 0.5MG/DOS, (OZEMPIC, 0.25 OR 0.5 MG/DOSE,) 2 MG/3ML SOPN Inject 0.5 Units into the skin once a week.   No current facility-administered medications on file prior to visit.     Allergies:   Patient has no known  allergies.   Social History   Tobacco Use   Smoking status: Former    Types: Cigarettes    Quit date: 07/04/2014    Years since quitting: 7.9   Smokeless tobacco: Never  Vaping Use   Vaping Use: Never used  Substance Use Topics   Alcohol use: No    Alcohol/week: 0.0 standard drinks of alcohol   Drug use: No    Family History: family history includes Angina in her mother; Breast cancer (age of onset: 80) in her mother; Hypertension in her father and mother; Pancreatic cancer in her father. There is no history of  Colon cancer, Colon polyps, Rectal cancer, or Stomach cancer.  ROS:   Please see the history of present illness.   (+) Pain in Hip (+) Pain in Feet All other systems are reviewed and are negative.   EKGs/Labs/Other Studies Reviewed:    The following studies were reviewed today:  Fractional Flow Reserve 05/29/22:  FINDINGS: FFRct analysis was performed on the original cardiac CT angiogram dataset. Diagrammatic representation of the FFRct analysis is provided in a separate PDF document in PACS. This dictation was created using the PDF document and an interactive 3D model of the results. 3D model is not available in the EMR/PACS. Normal FFR range is >0.80.   1. Left Main: Normal  2. LAD: Normal  3. LCX: Normal  4. Ramus: N/a  5. RCA: Normal (0.9 mid RCA)   IMPRESSION: 1.  Normal FFR (0.9 mid RCA)  CT Coronary Morph 05/29/22:  FINDINGS:   Cardiovascular: Please see dedicated report for cardiovascular details.   Mediastinum/Nodes: No adenopathy or acute process in the mediastinum.   Lungs/Pleura: Basilar atelectasis. Airways are patent to the extent evaluated. Lungs are otherwise clear to the extent evaluated.   Upper Abdomen: Calcification in the dome of the RIGHT hemiliver compatible with prior infection or trauma. Small hiatal hernia. No acute upper abdominal process.   Musculoskeletal: No acute bone finding. No destructive bone process. Spinal degenerative changes.   IMPRESSION:   No acute or significant extracardiac findings aside from small hiatal hernia.  CT Coronary Morph w Calcium Score 05/29/22:  IMPRESSION: 1. Coronary calcium score of 1191. This was 40 percentile for age and sex matched control.   2. Normal coronary origin with right dominance.   3. Diffuse calcified plaque with mild LAD stenosis and moderate mid RCA stenosis. FFR is normal.   4. Aortic atherosclerosis.  Echo 04/22/22:  1. Left ventricular ejection fraction, by estimation, is  55 to 60%. The  left ventricle has normal function. The left ventricle has no regional  wall motion abnormalities. Left ventricular diastolic parameters are  consistent with Grade I diastolic  dysfunction (impaired relaxation).   2. Right ventricular systolic function is normal. The right ventricular  size is normal.   3. The mitral valve is normal in structure. No evidence of mitral valve  regurgitation. No evidence of mitral stenosis.   4. The aortic valve is tricuspid. There is mild calcification of the  aortic valve. Aortic valve regurgitation is trivial. No aortic stenosis is  present.   5. The inferior vena cava is normal in size with greater than 50%  respiratory variability, suggesting right atrial pressure of 3 mmHg.   Chest X-Ray  06/24/2021: FINDINGS: Diffuse peribronchial cuffing. Lung volumes are normal. No consolidative airspace disease. No pleural effusions. No pneumothorax. No pulmonary nodule or mass noted. Pulmonary vasculature and the cardiomediastinal silhouette are within normal limits.   IMPRESSION: 1.  Diffuse peribronchial cuffing concerning for an acute bronchitis.   EKG:  EKG is personally reviewed.   06/16/22: not ordered today 03/26/2022: NSR at 73 bpm  Recent Labs: 03/13/2022: ALT 20; BUN 26; Creatinine, Ser 0.83; Hemoglobin 13.4; Platelets 254.0; Potassium 4.2; Sodium 141; TSH 2.11   Recent Lipid Panel    Component Value Date/Time   CHOL 200 03/13/2022 0944   TRIG 120.0 03/13/2022 0944   HDL 59.00 03/13/2022 0944   CHOLHDL 3 03/13/2022 0944   VLDL 24.0 03/13/2022 0944   LDLCALC 117 (H) 03/13/2022 0944   LDLDIRECT 138.5 10/17/2013 0817    Physical Exam:    VS:  BP 110/70 (BP Location: Right Arm, Patient Position: Sitting, Cuff Size: Normal)   Pulse 87   Ht 5' 3.75" (1.619 m)   Wt 150 lb 14.4 oz (68.4 kg)   SpO2 94%   BMI 26.11 kg/m     Wt Readings from Last 3 Encounters:  05/26/22 150 lb (68 kg)  03/26/22 160 lb (72.6 kg)  03/10/22  158 lb (71.7 kg)    GEN: Well nourished, well developed in no acute distress HEENT: Normal, moist mucous membranes NECK: No JVD CARDIAC: regular rhythm, normal S1 and S2, no rubs or gallops. No murmur. VASCULAR: Radial and DP pulses 2+ bilaterally. No carotid bruits RESPIRATORY:  Clear to auscultation without rales, wheezing or rhonchi  ABDOMEN: Soft, non-tender, non-distended MUSCULOSKELETAL:  Ambulates independently SKIN: Warm and dry, no edema NEUROLOGIC:  Alert and oriented x 3. No focal neuro deficits noted. PSYCHIATRIC:  Normal affect    ASSESSMENT:    1. Pure hypercholesterolemia   2. Family history of heart disease   3. Nonocclusive coronary atherosclerosis of native coronary artery   4. Familial hypercholesterolemia   5. Cardiac risk counseling   6. Counseling on health promotion and disease prevention   7. Nutritional counseling     PLAN:    Nonobstructive CAD Dyspnea on exertion Hypercholesterolemia Family history of heart disease Review of cardiac CT -reviewed CAD on CT. Discussed management options. Will increase to rosuvastatin 40 mg plus add ezetimibe daily now to get to goal, if not at goal on recheck in 3 mos would consider PCSK9i. Current LDL 117, goal <70. -reviewed red flag warning signs that need immediate medical attention -on semaglutide -add aspirin today  Hypertension -at goal, continue lisinopril  Cardiac risk counseling and prevention recommendations: -recommend heart healthy/Mediterranean diet, with whole grains, fruits, vegetable, fish, lean meats, nuts, and olive oil. Limit salt. -recommend moderate walking, 3-5 times/week for 30-50 minutes each session. Aim for at least 150 minutes.week. Goal should be pace of 3 miles/hours, or walking 1.5 miles in 30 minutes -recommend avoidance of tobacco products. Avoid excess alcohol.  Plan for follow up: 6 mos  Buford Dresser, MD, PhD, [facility]    Medication  Adjustments/Labs and Tests Ordered: Current medicines are reviewed at length with the patient today.  Concerns regarding medicines are outlined above.   Orders Placed This Encounter  Procedures   Lipid panel   Meds ordered this encounter  Medications   aspirin EC 81 MG tablet    Sig: Take 1 tablet (81 mg total) by mouth daily. Swallow whole.    Dispense:  90 tablet    Refill:  3   ezetimibe (ZETIA) 10 MG tablet    Sig: Take 1 tablet (10 mg total) by mouth daily.    Dispense:  90 tablet    Refill:  3  rosuvastatin (CRESTOR) 40 MG tablet    Sig: Take 1 tablet (40 mg total) by mouth daily.    Dispense:  90 tablet    Refill:  3   Patient Instructions  Medication Instructions:  START: Aspirin 81 mg daily START: Zetia 10 mg daily INCREASE: Rosuvastatin 40 mg daily  *If you need a refill on your cardiac medications before your next appointment, please call your pharmacy*   Lab Work: Your provider has recommended lab work January, 2024 (fasting lipid). Please have this collected at [facility] [place]. The lab is open 8:00 am - 4:30 pm. Please avoid 12:00p - 1:00p for lunch hour. You do not need an appointment. Please go to [address] [place], [place] [address]. This is in the Primary Care office on the 3rd floor, let them know you are there for blood work and they will direct you to the lab.  If you have labs (blood work) drawn today and your tests are completely normal, you will receive your results only by: [facility] (if you have MyChart) OR A paper copy in the mail If you have any lab test that is abnormal or we need to change your treatment, we will call you to review the results.   Testing/Procedures: None ordered today   Follow-Up: At [facility], you and your health needs are our priority.  As part of our continuing mission to provide you with exceptional heart care, we have created designated Provider Care Teams.  These  Care Teams include your primary Cardiologist (physician) and Advanced Practice Providers (APPs -  Physician Assistants and Nurse Practitioners) who all work together to provide you with the care you need, when you need it.  We recommend signing up for the patient portal called "MyChart".  Sign up information is provided on this After Visit Summary.  MyChart is used to connect with patients for Virtual Visits (Telemedicine).  Patients are able to view lab/test results, encounter notes, upcoming appointments, etc.  Non-urgent messages can be sent to your provider as well.   To learn more about what you can do with MyChart, go to [email or web address].    Your next appointment:   6 month(s)  The format for your next appointment:   In Person  Provider:   Buford Dresser, MD              Martie Lee Mosetta Pigeon Buren,acting as a scribe for Buford Dresser, MD.,have documented all relevant documentation on the behalf of Buford Dresser, MD,as directed by  Buford Dresser, MD while in the presence of Buford Dresser, MD.    I, Buford Dresser, MD, have reviewed all documentation for this visit. The documentation on 07/17/22 for the exam, diagnosis, procedures, and orders are all accurate and complete.

## 2022-06-16 NOTE — Patient Instructions (Signed)
Medication Instructions:  START: Aspirin 81 mg daily START: Zetia 10 mg daily INCREASE: Rosuvastatin 40 mg daily  *If you need a refill on your cardiac medications before your next appointment, please call your pharmacy*   Lab Work: Your provider has recommended lab work January, 2024 (fasting lipid). Please have this collected at [facility] at [place]. The lab is open 8:00 am - 4:30 pm. Please avoid 12:00p - 1:00p for lunch hour. You do not need an appointment. Please go to [address] [place], [place] [address]. This is in the Primary Care office on the 3rd floor, let them know you are there for blood work and they will direct you to the lab.  If you have labs (blood work) drawn today and your tests are completely normal, you will receive your results only by: [place] (if you have MyChart) OR A paper copy in the mail If you have any lab test that is abnormal or we need to change your treatment, we will call you to review the results.   Testing/Procedures: None ordered today   Follow-Up: At [facility], you and your health needs are our priority.  As part of our continuing mission to provide you with exceptional heart care, we have created designated Provider Care Teams.  These Care Teams include your primary Cardiologist (physician) and Advanced Practice Providers (APPs -  Physician Assistants and Nurse Practitioners) who all work together to provide you with the care you need, when you need it.  We recommend signing up for the patient portal called "MyChart".  Sign up information is provided on this After Visit Summary.  MyChart is used to connect with patients for Virtual Visits (Telemedicine).  Patients are able to view lab/test results, encounter notes, upcoming appointments, etc.  Non-urgent messages can be sent to your provider as well.   To learn more about what you can do with MyChart, go to [email or web address].    Your next  appointment:   6 month(s)  The format for your next appointment:   In Person  Provider:   Buford Dresser, MD

## 2022-06-25 NOTE — Progress Notes (Signed)
Virtual Visit via Video Note  I connected with Shelley Wright on 06/26/22 at  9:30 AM EDT by a video enabled telemedicine application and verified that I am speaking with the correct person using two identifiers. Location patient: home Location provider:home office Persons participating in the virtual visit: patient, provider   Patient aware  of the limitations of evaluation and management by telemedicine and  availability of in person appointments. and agreed to proceed.   HPI: Shelley Wright presents for video visit Follow-up assessment on Ozempic now on 0.5 mg/week.  She states her weight is down to 147 and does have nausea. Sometimes gets middle of the night nausea and vomiting but no abdominal pain some constipation but no obstructive symptoms. No heartburn respiratory issues.  She is pleased with the weight loss but stated is going slow. Reports that she wanted her goal weight to be 130.  At this point not doing organized resistance exercise. She has seen cardiology who has increased her rosuvastatin and begun Zetia and baby aspirin.  ROS: See pertinent positives and negatives per HPI.  Past Medical History:  Diagnosis Date   Anxiety    no per pt   Arthritis    GERD (gastroesophageal reflux disease)    Heart murmur    'YEARS AGO"   History of smoking    History of ulcerative colitis 2005   pancolitis    Hyperlipidemia    Hypertension    Normal nuclear stress test 2008   myoview     Past Surgical History:  Procedure Laterality Date   BREAST BIOPSY Left 1986   biopsy x 2    COLONOSCOPY     KNEE SURGERY Left october   x 2    Family History  Problem Relation Age of Onset   Pancreatic cancer Father        deceased   Hypertension Father    Breast cancer Mother 48   Angina Mother    Hypertension Mother    Colon cancer Neg Hx    Colon polyps Neg Hx    Rectal cancer Neg Hx    Stomach cancer Neg Hx     Social History   Tobacco Use   Smoking status:  Former    Types: Cigarettes    Quit date: 07/04/2014    Years since quitting: 7.9   Smokeless tobacco: Never  Vaping Use   Vaping Use: Never used  Substance Use Topics   Alcohol use: No    Alcohol/week: 0.0 standard drinks of alcohol   Drug use: No      Current Outpatient Medications:    aspirin EC 81 MG tablet, Take 1 tablet (81 mg total) by mouth daily. Swallow whole., Disp: 90 tablet, Rfl: 3   escitalopram (LEXAPRO) 10 MG tablet, Take 10 mg by mouth every other day., Disp: , Rfl:    ezetimibe (ZETIA) 10 MG tablet, Take 1 tablet (10 mg total) by mouth daily., Disp: 90 tablet, Rfl: 3   lisinopril (ZESTRIL) 10 MG tablet, Take 1 tablet (10 mg total) by mouth daily., Disp: 60 tablet, Rfl: 1   rosuvastatin (CRESTOR) 40 MG tablet, Take 1 tablet (40 mg total) by mouth daily., Disp: 90 tablet, Rfl: 3   diclofenac (VOLTAREN) 75 MG EC tablet, diclofenac sodium 75 mg tablet,delayed release (Patient not taking: Reported on 05/26/2022), Disp: , Rfl:   EXAM: BP Readings from Last 3 Encounters:  06/26/22 116/70  06/16/22 110/70  05/29/22 110/61    Body mass index  is 26.04 kg/m.  VITALS per patient if applicable:  GENERAL: alert, oriented, appears well and in no acute distress  HEENT: atraumatic, conjunttiva clear, no obvious abnormalities on inspection of external nose and ears  NECK: normal movements of the head and neck  LUNGS: on inspection no signs of respiratory distress, breathing rate appears normal, no obvious gross SOB, gasping or wheezing  CV: no obvious cyanosis  MS: moves all visible extremities without noticeable abnormality  PSYCH/NEURO: pleasant and cooperative, no obvious depression or anxiety, speech and thought processing grossly intact Lab Results  Component Value Date   WBC 8.1 03/13/2022   HGB 13.4 03/13/2022   HCT 41.2 03/13/2022   PLT 254.0 03/13/2022   GLUCOSE 87 03/13/2022   CHOL 200 03/13/2022   TRIG 120.0 03/13/2022   HDL 59.00 03/13/2022    LDLDIRECT 138.5 10/17/2013   LDLCALC 117 (H) 03/13/2022   ALT 20 03/13/2022   AST 24 03/13/2022   NA 141 03/13/2022   K 4.2 03/13/2022   CL 105 03/13/2022   CREATININE 0.83 03/13/2022   BUN 26 (H) 03/13/2022   CO2 29 03/13/2022   TSH 2.11 03/13/2022   HGBA1C 5.9 03/13/2022    ASSESSMENT AND PLAN:  Discussed the following assessment and plan:    ICD-10-CM   1. Medication management  Z79.899    bmi 26 , some gi se cautions  discussed     2. Pre-diabetes  R73.03     3. Hyperlipidemia, unspecified hyperlipidemia type  E78.5     4. Family history of cardiovascular disease  Z82.49      Side effects of medicine but no obstructive symptoms discussed cautions can remain on same dose without increase if getting increasing GI symptoms can back off.  Temporarily.  Advised her to reassess her goal of 130 is too low based, medication as opposed to lifestyle.  As we do not know the long-term effects yet of these medications.  Make sure you are adding resistance training for muscle mass maintenance. Plan follow-up visit in about 3 months after she gets her follow-up lipid panel with cardiology. Will let us know if having increasing side effects or concerns. Counseled.   Expectant management and discussion of plan and treatment with opportunity to ask questions and all were answered. The patient agreed with the plan and demonstrated an understanding of the instructions.   Advised to call back or seek an in-person evaluation if worsening  or having  further concerns  in interim. Return for 3 mos after lipid results back or earlier if having inc sse of med.    Shanon Ace, MD

## 2022-06-26 ENCOUNTER — Encounter: Payer: Self-pay | Admitting: Internal Medicine

## 2022-06-26 ENCOUNTER — Telehealth (INDEPENDENT_AMBULATORY_CARE_PROVIDER_SITE_OTHER): Payer: Medicare HMO | Admitting: Internal Medicine

## 2022-06-26 VITALS — BP 116/70 | Temp 97.0°F | Ht 63.0 in | Wt 147.0 lb

## 2022-06-26 DIAGNOSIS — Z79899 Other long term (current) drug therapy: Secondary | ICD-10-CM | POA: Diagnosis not present

## 2022-06-26 DIAGNOSIS — E785 Hyperlipidemia, unspecified: Secondary | ICD-10-CM

## 2022-06-26 DIAGNOSIS — Z8249 Family history of ischemic heart disease and other diseases of the circulatory system: Secondary | ICD-10-CM

## 2022-06-26 DIAGNOSIS — R7303 Prediabetes: Secondary | ICD-10-CM | POA: Diagnosis not present

## 2022-07-02 DIAGNOSIS — Z1231 Encounter for screening mammogram for malignant neoplasm of breast: Secondary | ICD-10-CM | POA: Diagnosis not present

## 2022-07-02 LAB — HM MAMMOGRAPHY

## 2022-07-07 DIAGNOSIS — Z23 Encounter for immunization: Secondary | ICD-10-CM | POA: Diagnosis not present

## 2022-07-17 ENCOUNTER — Telehealth: Payer: Self-pay | Admitting: Internal Medicine

## 2022-07-17 ENCOUNTER — Encounter (HOSPITAL_BASED_OUTPATIENT_CLINIC_OR_DEPARTMENT_OTHER): Payer: Self-pay | Admitting: Cardiology

## 2022-07-17 DIAGNOSIS — M549 Dorsalgia, unspecified: Secondary | ICD-10-CM

## 2022-07-17 NOTE — Telephone Encounter (Signed)
Pt called back regarding the mammogram results and also requesting a referral for a [facility] [phone]. She has an appointment in the morning. Also requesting a refill Semaglutide,0.25 or 0.5MG/DOS, (OZEMPIC, 0.25 OR 0.5 MG/DOSE,) 2 MG/3ML SOPN

## 2022-07-17 NOTE — Telephone Encounter (Signed)
Pt called to say she has some concerns regarding the mammography results she got from [facility] and would like a call back to discuss, as soon as possible.

## 2022-07-18 ENCOUNTER — Other Ambulatory Visit: Payer: Self-pay

## 2022-07-18 ENCOUNTER — Encounter: Payer: Self-pay | Admitting: Internal Medicine

## 2022-07-18 DIAGNOSIS — M5451 Vertebrogenic low back pain: Secondary | ICD-10-CM | POA: Diagnosis not present

## 2022-07-18 DIAGNOSIS — M25552 Pain in left hip: Secondary | ICD-10-CM | POA: Diagnosis not present

## 2022-07-18 DIAGNOSIS — M79671 Pain in right foot: Secondary | ICD-10-CM | POA: Diagnosis not present

## 2022-07-18 DIAGNOSIS — M9903 Segmental and somatic dysfunction of lumbar region: Secondary | ICD-10-CM | POA: Diagnosis not present

## 2022-07-18 MED ORDER — OZEMPIC (0.25 OR 0.5 MG/DOSE) 2 MG/3ML ~~LOC~~ SOPN: 0.5000 [IU] | PEN_INJECTOR | SUBCUTANEOUS | 1 refills | Status: DC

## 2022-07-18 NOTE — Telephone Encounter (Signed)
Pt called to FU and would really like a call back.  Pt is aware MD only does virtuals on Thursday and is off on Friday. Pt is asking if another MD can look into this for her?  Please advise.

## 2022-07-18 NOTE — Progress Notes (Signed)
[facility] 

## 2022-07-18 NOTE — Telephone Encounter (Signed)
A refill for semaglutide is sent to [facility].

## 2022-07-18 NOTE — Telephone Encounter (Signed)
I don't see the mammogram results imaging  or  media,   in the ehr please advise where I find the results. So I can review in Need diagnosis to do a referral   for chiro. Ok to refill  the semiglutide x 1

## 2022-07-18 NOTE — Telephone Encounter (Signed)
Spoke to patient. She inform she had went to see [facility] today for low back pain, bursitis. She reports that Dr. Regis Bill is already aware of her pain. She pay full price for the visit bc there was no referral. Patient informs she is returning on Monday at 9am. Will be seeing them 3 times a wk-Tue. Wed. Thur.  Need a referral sent before the appt.   Patient has a follow up appt with [facility] due to incomplete-needing additional evaluation. Inform patient that we would give her a call on the result once the she receive the second one. Patient verbalized understanding.   Mammo on 07/02/22 place in green folder.

## 2022-07-20 NOTE — Telephone Encounter (Signed)
Ok to do referral  to chiropractic for back pain s per patient request. .

## 2022-07-21 DIAGNOSIS — M25552 Pain in left hip: Secondary | ICD-10-CM | POA: Diagnosis not present

## 2022-07-21 DIAGNOSIS — M9903 Segmental and somatic dysfunction of lumbar region: Secondary | ICD-10-CM | POA: Diagnosis not present

## 2022-07-21 DIAGNOSIS — M79671 Pain in right foot: Secondary | ICD-10-CM | POA: Diagnosis not present

## 2022-07-21 DIAGNOSIS — M5451 Vertebrogenic low back pain: Secondary | ICD-10-CM | POA: Diagnosis not present

## 2022-07-21 NOTE — Addendum Note (Signed)
Addended byEncarnacion Slates on: 07/21/2022 07:56 AM   Modules accepted: Orders

## 2022-07-23 DIAGNOSIS — M25552 Pain in left hip: Secondary | ICD-10-CM | POA: Diagnosis not present

## 2022-07-23 DIAGNOSIS — M9903 Segmental and somatic dysfunction of lumbar region: Secondary | ICD-10-CM | POA: Diagnosis not present

## 2022-07-23 DIAGNOSIS — M79671 Pain in right foot: Secondary | ICD-10-CM | POA: Diagnosis not present

## 2022-07-23 DIAGNOSIS — M5451 Vertebrogenic low back pain: Secondary | ICD-10-CM | POA: Diagnosis not present

## 2022-07-24 ENCOUNTER — Telehealth: Payer: Self-pay | Admitting: Internal Medicine

## 2022-07-24 DIAGNOSIS — M79671 Pain in right foot: Secondary | ICD-10-CM | POA: Diagnosis not present

## 2022-07-24 DIAGNOSIS — M9903 Segmental and somatic dysfunction of lumbar region: Secondary | ICD-10-CM | POA: Diagnosis not present

## 2022-07-24 DIAGNOSIS — M25552 Pain in left hip: Secondary | ICD-10-CM | POA: Diagnosis not present

## 2022-07-24 DIAGNOSIS — M5451 Vertebrogenic low back pain: Secondary | ICD-10-CM | POA: Diagnosis not present

## 2022-07-24 NOTE — Telephone Encounter (Signed)
Pt is in donut hole and can not afford ozempic and would like to know if she can come off medication until jan 2024

## 2022-07-25 DIAGNOSIS — N6323 Unspecified lump in the left breast, lower outer quadrant: Secondary | ICD-10-CM | POA: Diagnosis not present

## 2022-07-25 DIAGNOSIS — R92322 Mammographic fibroglandular density, left breast: Secondary | ICD-10-CM | POA: Diagnosis not present

## 2022-07-25 LAB — HM MAMMOGRAPHY

## 2022-07-27 NOTE — Telephone Encounter (Signed)
Sure can   can stop for now

## 2022-07-28 DIAGNOSIS — M9903 Segmental and somatic dysfunction of lumbar region: Secondary | ICD-10-CM | POA: Diagnosis not present

## 2022-07-28 DIAGNOSIS — M5451 Vertebrogenic low back pain: Secondary | ICD-10-CM | POA: Diagnosis not present

## 2022-07-28 DIAGNOSIS — M25552 Pain in left hip: Secondary | ICD-10-CM | POA: Diagnosis not present

## 2022-07-28 DIAGNOSIS — M79671 Pain in right foot: Secondary | ICD-10-CM | POA: Diagnosis not present

## 2022-07-29 NOTE — Telephone Encounter (Signed)
Contacted patient. Left a detail message, ok to stop ozempic for now per Dr. Regis Bill. To give [place] a call back if have any question.

## 2022-07-30 DIAGNOSIS — M9903 Segmental and somatic dysfunction of lumbar region: Secondary | ICD-10-CM | POA: Diagnosis not present

## 2022-07-30 DIAGNOSIS — M25552 Pain in left hip: Secondary | ICD-10-CM | POA: Diagnosis not present

## 2022-07-30 DIAGNOSIS — M5451 Vertebrogenic low back pain: Secondary | ICD-10-CM | POA: Diagnosis not present

## 2022-07-30 DIAGNOSIS — M79671 Pain in right foot: Secondary | ICD-10-CM | POA: Diagnosis not present

## 2022-07-31 ENCOUNTER — Encounter: Payer: Self-pay | Admitting: Internal Medicine

## 2022-07-31 DIAGNOSIS — M79671 Pain in right foot: Secondary | ICD-10-CM | POA: Diagnosis not present

## 2022-07-31 DIAGNOSIS — M25552 Pain in left hip: Secondary | ICD-10-CM | POA: Diagnosis not present

## 2022-07-31 DIAGNOSIS — M5451 Vertebrogenic low back pain: Secondary | ICD-10-CM | POA: Diagnosis not present

## 2022-07-31 DIAGNOSIS — M9903 Segmental and somatic dysfunction of lumbar region: Secondary | ICD-10-CM | POA: Diagnosis not present

## 2022-07-31 NOTE — Telephone Encounter (Signed)
Spoke to patient and gave an okay to stop the medication for now. Patient reports she will follow up with Dr. Regis Bill at her visit in January.

## 2022-07-31 NOTE — Telephone Encounter (Signed)
Pt called to ask if she could go back on the Ozempic in January 2024?

## 2022-08-03 ENCOUNTER — Other Ambulatory Visit: Payer: Self-pay | Admitting: Internal Medicine

## 2022-08-04 DIAGNOSIS — M9903 Segmental and somatic dysfunction of lumbar region: Secondary | ICD-10-CM | POA: Diagnosis not present

## 2022-08-04 DIAGNOSIS — M5451 Vertebrogenic low back pain: Secondary | ICD-10-CM | POA: Diagnosis not present

## 2022-08-04 DIAGNOSIS — M25552 Pain in left hip: Secondary | ICD-10-CM | POA: Diagnosis not present

## 2022-08-04 DIAGNOSIS — M79671 Pain in right foot: Secondary | ICD-10-CM | POA: Diagnosis not present

## 2022-08-04 NOTE — Telephone Encounter (Signed)
Last VV-06/26/22 Last refill sent in by historical provider  Please advise if okay to send in refill

## 2022-08-13 DIAGNOSIS — M5451 Vertebrogenic low back pain: Secondary | ICD-10-CM | POA: Diagnosis not present

## 2022-08-13 DIAGNOSIS — M25552 Pain in left hip: Secondary | ICD-10-CM | POA: Diagnosis not present

## 2022-08-13 DIAGNOSIS — M79671 Pain in right foot: Secondary | ICD-10-CM | POA: Diagnosis not present

## 2022-08-13 DIAGNOSIS — M9903 Segmental and somatic dysfunction of lumbar region: Secondary | ICD-10-CM | POA: Diagnosis not present

## 2022-08-19 DIAGNOSIS — M25552 Pain in left hip: Secondary | ICD-10-CM | POA: Diagnosis not present

## 2022-08-19 DIAGNOSIS — M9903 Segmental and somatic dysfunction of lumbar region: Secondary | ICD-10-CM | POA: Diagnosis not present

## 2022-08-19 DIAGNOSIS — M79671 Pain in right foot: Secondary | ICD-10-CM | POA: Diagnosis not present

## 2022-08-19 DIAGNOSIS — M5451 Vertebrogenic low back pain: Secondary | ICD-10-CM | POA: Diagnosis not present

## 2022-08-21 DIAGNOSIS — M79671 Pain in right foot: Secondary | ICD-10-CM | POA: Diagnosis not present

## 2022-08-21 DIAGNOSIS — M9903 Segmental and somatic dysfunction of lumbar region: Secondary | ICD-10-CM | POA: Diagnosis not present

## 2022-08-21 DIAGNOSIS — M5451 Vertebrogenic low back pain: Secondary | ICD-10-CM | POA: Diagnosis not present

## 2022-08-21 DIAGNOSIS — M25552 Pain in left hip: Secondary | ICD-10-CM | POA: Diagnosis not present

## 2022-08-26 DIAGNOSIS — M9903 Segmental and somatic dysfunction of lumbar region: Secondary | ICD-10-CM | POA: Diagnosis not present

## 2022-08-26 DIAGNOSIS — M79671 Pain in right foot: Secondary | ICD-10-CM | POA: Diagnosis not present

## 2022-08-26 DIAGNOSIS — M25552 Pain in left hip: Secondary | ICD-10-CM | POA: Diagnosis not present

## 2022-08-26 DIAGNOSIS — M5451 Vertebrogenic low back pain: Secondary | ICD-10-CM | POA: Diagnosis not present

## 2022-08-28 DIAGNOSIS — M79671 Pain in right foot: Secondary | ICD-10-CM | POA: Diagnosis not present

## 2022-08-28 DIAGNOSIS — M9903 Segmental and somatic dysfunction of lumbar region: Secondary | ICD-10-CM | POA: Diagnosis not present

## 2022-08-28 DIAGNOSIS — M5451 Vertebrogenic low back pain: Secondary | ICD-10-CM | POA: Diagnosis not present

## 2022-08-28 DIAGNOSIS — M25552 Pain in left hip: Secondary | ICD-10-CM | POA: Diagnosis not present

## 2022-09-09 ENCOUNTER — Other Ambulatory Visit (HOSPITAL_COMMUNITY): Payer: Self-pay

## 2022-09-13 ENCOUNTER — Other Ambulatory Visit: Payer: Self-pay | Admitting: Internal Medicine

## 2022-09-16 ENCOUNTER — Telehealth: Payer: Self-pay

## 2022-09-16 DIAGNOSIS — E785 Hyperlipidemia, unspecified: Secondary | ICD-10-CM

## 2022-09-22 ENCOUNTER — Other Ambulatory Visit: Payer: Medicare HMO

## 2022-09-22 DIAGNOSIS — M25552 Pain in left hip: Secondary | ICD-10-CM | POA: Diagnosis not present

## 2022-09-22 DIAGNOSIS — M79671 Pain in right foot: Secondary | ICD-10-CM | POA: Diagnosis not present

## 2022-09-22 DIAGNOSIS — M9903 Segmental and somatic dysfunction of lumbar region: Secondary | ICD-10-CM | POA: Diagnosis not present

## 2022-09-22 DIAGNOSIS — M5451 Vertebrogenic low back pain: Secondary | ICD-10-CM | POA: Diagnosis not present

## 2022-09-23 ENCOUNTER — Telehealth: Payer: Self-pay

## 2022-09-23 ENCOUNTER — Other Ambulatory Visit (HOSPITAL_COMMUNITY): Payer: Self-pay

## 2022-09-23 NOTE — Telephone Encounter (Signed)
Followed up call about prior auth for Ozempic '2mg'$ /42m.   Key: [id number]  Per test claim, the Ozempic is approved.   Placed a call to the pharmacy to let them know of the approval and the pharmacists said the copay was $432.00.

## 2022-09-29 DIAGNOSIS — M25552 Pain in left hip: Secondary | ICD-10-CM | POA: Diagnosis not present

## 2022-09-29 DIAGNOSIS — M5451 Vertebrogenic low back pain: Secondary | ICD-10-CM | POA: Diagnosis not present

## 2022-09-29 DIAGNOSIS — M9903 Segmental and somatic dysfunction of lumbar region: Secondary | ICD-10-CM | POA: Diagnosis not present

## 2022-09-29 DIAGNOSIS — M79671 Pain in right foot: Secondary | ICD-10-CM | POA: Diagnosis not present

## 2022-09-30 ENCOUNTER — Ambulatory Visit (INDEPENDENT_AMBULATORY_CARE_PROVIDER_SITE_OTHER): Payer: Medicare HMO

## 2022-09-30 VITALS — Ht 63.75 in | Wt 150.0 lb

## 2022-09-30 DIAGNOSIS — Z0000 Encounter for general adult medical examination without abnormal findings: Secondary | ICD-10-CM | POA: Diagnosis not present

## 2022-09-30 NOTE — Patient Instructions (Addendum)
Shelley Wright , Thank you for taking time to come for your Medicare Wellness Visit. I appreciate your ongoing commitment to your health goals. Please review the following plan we discussed and let me know if I can assist you in the future.   These are the goals we discussed:  Goals       Patient Stated (pt-stated)      Lose weight.      Quit smoking / using tobacco        This is a list of the screening recommended for you and due dates:  Health Maintenance  Topic Date Due   DTaP/Tdap/Td vaccine (1 - Tdap) Never done   COVID-19 Vaccine (6 - 2023-24 season) 10/16/2022*   Zoster (Shingles) Vaccine (1 of 2) 12/30/2022*   Colon Cancer Screening  12/30/2022   Medicare Annual Wellness Visit  10/01/2023   Mammogram  07/25/2024   Pneumonia Vaccine  Completed   Flu Shot  Completed   DEXA scan (bone density measurement)  Completed   Hepatitis C Screening: USPSTF Recommendation to screen - Ages 18-79 yo.  Completed   HPV Vaccine  Aged Out  *Topic was postponed. The date shown is not the original due date.    Advanced directives: Please bring a copy of your health care power of attorney and living will to the office to be added to your chart at your convenience.   Conditions/risks identified: None  Next appointment: Follow up in one year for your annual wellness visit     Preventive Care 65 Years and Older, Female Preventive care refers to lifestyle choices and visits with your health care provider that can promote health and wellness. What does preventive care include? A yearly physical exam. This is also called an annual well check. Dental exams once or twice a year. Routine eye exams. Ask your health care provider how often you should have your eyes checked. Personal lifestyle choices, including: Daily care of your teeth and gums. Regular physical activity. Eating a healthy diet. Avoiding tobacco and drug use. Limiting alcohol use. Practicing safe sex. Taking low-dose aspirin  every day. Taking vitamin and mineral supplements as recommended by your health care provider. What happens during an annual well check? The services and screenings done by your health care provider during your annual well check will depend on your age, overall health, lifestyle risk factors, and family history of disease. Counseling  Your health care provider may ask you questions about your: Alcohol use. Tobacco use. Drug use. Emotional well-being. Home and relationship well-being. Sexual activity. Eating habits. History of falls. Memory and ability to understand (cognition). Work and work Statistician. Reproductive health. Screening  You may have the following tests or measurements: Height, weight, and BMI. Blood pressure. Lipid and cholesterol levels. These may be checked every 5 years, or more frequently if you are over 70 years old. Skin check. Lung cancer screening. You may have this screening every year starting at age 58 if you have a 30-pack-year history of smoking and currently smoke or have quit within the past 15 years. Fecal occult blood test (FOBT) of the stool. You may have this test every year starting at age 55. Flexible sigmoidoscopy or colonoscopy. You may have a sigmoidoscopy every 5 years or a colonoscopy every 10 years starting at age 28. Hepatitis C blood test. Hepatitis B blood test. Sexually transmitted disease (STD) testing. Diabetes screening. This is done by checking your blood sugar (glucose) after you have not eaten for a while (  fasting). You may have this done every 1-3 years. Bone density scan. This is done to screen for osteoporosis. You may have this done starting at age 50. Mammogram. This may be done every 1-2 years. Talk to your health care provider about how often you should have regular mammograms. Talk with your health care provider about your test results, treatment options, and if necessary, the need for more tests. Vaccines  Your health  care provider may recommend certain vaccines, such as: Influenza vaccine. This is recommended every year. Tetanus, diphtheria, and acellular pertussis (Tdap, Td) vaccine. You may need a Td booster every 10 years. Zoster vaccine. You may need this after age 28. Pneumococcal 13-valent conjugate (PCV13) vaccine. One dose is recommended after age 41. Pneumococcal polysaccharide (PPSV23) vaccine. One dose is recommended after age 63. Talk to your health care provider about which screenings and vaccines you need and how often you need them. This information is not intended to replace advice given to you by your health care provider. Make sure you discuss any questions you have with your health care provider. Document Released: 09/28/2015 Document Revised: 05/21/2016 Document Reviewed: 07/03/2015 Elsevier Interactive Patient Education  2017 [place] Prevention in the Home Falls can cause injuries. They can happen to people of all ages. There are many things you can do to make your home safe and to help prevent falls. What can I do on the outside of my home? Regularly fix the edges of walkways and driveways and fix any cracks. Remove anything that might make you trip as you walk through a door, such as a raised step or threshold. Trim any bushes or trees on the path to your home. Use bright outdoor lighting. Clear any walking paths of anything that might make someone trip, such as rocks or tools. Regularly check to see if handrails are loose or broken. Make sure that both sides of any steps have handrails. Any raised decks and porches should have guardrails on the edges. Have any leaves, snow, or ice cleared regularly. Use sand or salt on walking paths during winter. Clean up any spills in your garage right away. This includes oil or grease spills. What can I do in the bathroom? Use night lights. Install grab bars by the toilet and in the tub and shower. Do not use towel bars as grab  bars. Use non-skid mats or decals in the tub or shower. If you need to sit down in the shower, use a plastic, non-slip stool. Keep the floor dry. Clean up any water that spills on the floor as soon as it happens. Remove soap buildup in the tub or shower regularly. Attach bath mats securely with double-sided non-slip rug tape. Do not have throw rugs and other things on the floor that can make you trip. What can I do in the bedroom? Use night lights. Make sure that you have a light by your bed that is easy to reach. Do not use any sheets or blankets that are too big for your bed. They should not hang down onto the floor. Have a firm chair that has side arms. You can use this for support while you get dressed. Do not have throw rugs and other things on the floor that can make you trip. What can I do in the kitchen? Clean up any spills right away. Avoid walking on wet floors. Keep items that you use a lot in easy-to-reach places. If you need to reach something above you, use  a strong step stool that has a grab bar. Keep electrical cords out of the way. Do not use floor polish or wax that makes floors slippery. If you must use wax, use non-skid floor wax. Do not have throw rugs and other things on the floor that can make you trip. What can I do with my stairs? Do not leave any items on the stairs. Make sure that there are handrails on both sides of the stairs and use them. Fix handrails that are broken or loose. Make sure that handrails are as long as the stairways. Check any carpeting to make sure that it is firmly attached to the stairs. Fix any carpet that is loose or worn. Avoid having throw rugs at the top or bottom of the stairs. If you do have throw rugs, attach them to the floor with carpet tape. Make sure that you have a light switch at the top of the stairs and the bottom of the stairs. If you do not have them, ask someone to add them for you. What else can I do to help prevent  falls? Wear shoes that: Do not have high heels. Have rubber bottoms. Are comfortable and fit you well. Are closed at the toe. Do not wear sandals. If you use a stepladder: Make sure that it is fully opened. Do not climb a closed stepladder. Make sure that both sides of the stepladder are locked into place. Ask someone to hold it for you, if possible. Clearly mark and make sure that you can see: Any grab bars or handrails. First and last steps. Where the edge of each step is. Use tools that help you move around (mobility aids) if they are needed. These include: Canes. Walkers. Scooters. Crutches. Turn on the lights when you go into a dark area. Replace any light bulbs as soon as they burn out. Set up your furniture so you have a clear path. Avoid moving your furniture around. If any of your floors are uneven, fix them. If there are any pets around you, be aware of where they are. Review your medicines with your doctor. Some medicines can make you feel dizzy. This can increase your chance of falling. Ask your doctor what other things that you can do to help prevent falls. This information is not intended to replace advice given to you by your health care provider. Make sure you discuss any questions you have with your health care provider. Document Released: 06/28/2009 Document Revised: 02/07/2016 Document Reviewed: 10/06/2014 Elsevier Interactive Patient Education  2017 [employer].

## 2022-09-30 NOTE — Progress Notes (Signed)
Subjective:   Shelley Wright is a 77 y.o. female who presents for Medicare Annual (Subsequent) preventive examination.  Review of Systems     Cardiac Risk Factors include: advanced age (>65mn, >>31women);hypertensionVirtual Visit via Telephone Note  I connected with  DLisabeth Registeron 09/30/22 at 10:30 AM EST by telephone and verified that I am speaking with the correct person using two identifiers.  Location: Patient: Home Provider: Office Persons participating in the virtual visit: patient/Nurse Health Advisor   I discussed the limitations, risks, security and privacy concerns of performing an evaluation and management service by telephone and the availability of in person appointments. The patient expressed understanding and agreed to proceed.  Interactive audio and video telecommunications were attempted between this nurse and patient, however failed, due to patient having technical difficulties OR patient did not have access to video capability.  We continued and completed visit with audio only.  Some vital signs may be absent or patient reported.   BCriselda Peaches LPN      Objective:    Today's Vitals   09/30/22 1034  Weight: 150 lb (68 kg)  Height: 5' 3.75" (1.619 m)   Body mass index is 25.95 kg/m.     09/30/2022   10:40 AM 12/18/2021    2:09 PM 09/26/2021   10:43 AM 07/19/2021   12:42 PM 07/17/2020    3:27 PM 09/21/2015    1:29 PM  Advanced Directives  Does Patient Have a Medical Advance Directive? Yes Yes Yes Yes Yes Yes  Type of AParamedicof AMc[place]Living will [place]Living will H[place]land LakesLiving will  HHill[place]Living will   Does patient want to make changes to medical advance directive?  No - Patient declined No - Patient declined     Copy of HHutchinsin Chart? No - copy requested No - copy requested No - copy requested  No - copy requested No - copy  requested    Current Medications (verified) Outpatient Encounter Medications as of 09/30/2022  Medication Sig   aspirin EC 81 MG tablet Take 1 tablet (81 mg total) by mouth daily. Swallow whole.   diclofenac (VOLTAREN) 75 MG EC tablet diclofenac sodium 75 mg tablet,delayed release (Patient not taking: Reported on 05/26/2022)   escitalopram (LEXAPRO) 10 MG tablet TAKE 1 TABLET EVERY DAY   ezetimibe (ZETIA) 10 MG tablet Take 1 tablet (10 mg total) by mouth daily.   lisinopril (ZESTRIL) 10 MG tablet TAKE 1 TABLET EVERY DAY   rosuvastatin (CRESTOR) 40 MG tablet Take 1 tablet (40 mg total) by mouth daily.   Semaglutide,0.25 or 0.'5MG'$ /DOS, (OZEMPIC, 0.25 OR 0.5 MG/DOSE,) 2 MG/3ML SOPN Inject 0.5 Units into the skin once a week.   No facility-administered encounter medications on file as of 09/30/2022.    Allergies (verified) Patient has no known allergies.   History: Past Medical History:  Diagnosis Date   Anxiety    no per pt   Arthritis    GERD (gastroesophageal reflux disease)    Heart murmur    'YEARS AGO"   History of smoking    History of ulcerative colitis 2005   pancolitis    Hyperlipidemia    Hypertension    Normal nuclear stress test 2008   myoview    Past Surgical History:  Procedure Laterality Date   BREAST BIOPSY Left 1986   biopsy x 2    COLONOSCOPY     KNEE  SURGERY Left october   x 2   Family History  Problem Relation Age of Onset   Pancreatic cancer Father        deceased   Hypertension Father    Breast cancer Mother 77   Angina Mother    Hypertension Mother    Colon cancer Neg Hx    Colon polyps Neg Hx    Rectal cancer Neg Hx    Stomach cancer Neg Hx    Social History   Socioeconomic History   Marital status: Married    Spouse name: Not on file   Number of children: 4   Years of education: Not on file   Highest education level: Not on file  Occupational History   Not on file  Tobacco Use   Smoking status: Former    Types: Cigarettes     Quit date: 07/04/2014    Years since quitting: 8.2   Smokeless tobacco: Never  Vaping Use   Vaping Use: Never used  Substance and Sexual Activity   Alcohol use: No    Alcohol/week: 0.0 standard drinks of alcohol   Drug use: No   Sexual activity: Not on file  Other Topics Concern   Not on file  Social History Narrative   Hh  Of 2    Pet dog    Off tobacco.   [employer].     Teacher runs school.   47 - 3    G4 P4    Husband had cva and blocked carotids    Left handed   Drinks caffeine   Two story home   Social Determinants of Health   Financial Resource Strain: Low Risk  (09/30/2022)   Overall Financial Resource Strain (CARDIA)    Difficulty of Paying Living Expenses: Not hard at all  Food Insecurity: No Food Insecurity (09/30/2022)   Hunger Vital Sign    Worried About Running Out of Food in the Last Year: Never true    Ran Out of Food in the Last Year: Never true  Transportation Needs: No Transportation Needs (09/30/2022)   PRAPARE - Hydrologist (Medical): No    Lack of Transportation (Non-Medical): No  Physical Activity: Inactive (09/30/2022)   Exercise Vital Sign    Days of Exercise per Week: 0 days    Minutes of Exercise per Session: 0 min  Stress: No Stress Concern Present (09/30/2022)   [place]    Feeling of Stress : Not at all  Social Connections: [place] (09/30/2022)   Social Connection and Isolation Panel [NHANES]    Frequency of Communication with Friends and Family: More than three times a week    Frequency of Social Gatherings with Friends and Family: More than three times a week    Attends Religious Services: More than 4 times per year    Active Member of [employer] or Organizations: Yes    Attends Music therapist: More than 4 times per year    Marital Status: Married    Tobacco Counseling Counseling given: Not Answered   Clinical  Intake:  Pre-visit preparation completed: No  Pain : No/denies pain     BMI - recorded: 25.95 Nutritional Status: BMI 25 -29 Overweight Nutritional Risks: None  How often do you need to have someone help you when you read instructions, pamphlets, or other written materials from your doctor or pharmacy?: 1 - Never  Diabetic?  No  Information entered by :: Rolene Arbour LPN   Activities of Daily Living    09/30/2022   10:39 AM  In your present state of health, do you have any difficulty performing the following activities:  Hearing? 0  Vision? 0  Difficulty concentrating or making decisions? 0  Walking or climbing stairs? 0  Dressing or bathing? 0  Doing errands, shopping? 0  Preparing Food and eating ? N  Using the Toilet? N  In the past six months, have you accidently leaked urine? N  Do you have problems with loss of bowel control? N  Managing your Medications? N  Managing your Finances? N  Housekeeping or managing your Housekeeping? N    Patient Care Team: Panosh, Standley Brooking, MD as PCP - General Buford Dresser, MD as PCP - Cardiology (Cardiology) Mauri Pole, MD as Consulting Physician (Gastroenterology) Rolm Bookbinder, MD as Consulting Physician (Dermatology)  Indicate any recent Medical Services you may have received from other than [facility] providers in the past year (date may be approximate).     Assessment:   This is a routine wellness examination for Kristianna.  Hearing/Vision screen Hearing Screening - Comments:: Denies hearing difficulties   Vision Screening - Comments:: Wears rx glasses - up to date with routine eye exams with  Dr Prudencio Burly  Dietary issues and exercise activities discussed: Exercise limited by: None identified   Goals Addressed               This Visit's Progress     Patient Stated (pt-stated)        Lose weight.       Depression Screen    09/30/2022   10:39 AM 03/10/2022    1:45 PM 09/26/2021   10:36 AM  07/17/2020    3:26 PM 07/04/2019    1:47 PM 06/04/2018    2:55 PM 06/02/2017    2:24 PM  PHQ 2/9 Scores  PHQ - 2 Score 0 1 0 0 0 0 0  PHQ- 9 Score  5         Fall Risk    09/30/2022   10:39 AM 03/10/2022    1:45 PM 09/26/2021   10:41 AM 07/19/2021   12:41 PM 07/17/2020    3:29 PM  Fall Risk   Falls in the past year? 0 0 0 0 0  Number falls in past yr: 0 0 0 0 0  Injury with Fall? 0 0 0 0 0  Risk for fall due to : No Fall Risks No Fall Risks   Impaired vision  Follow up Falls prevention discussed Falls prevention discussed   Falls prevention discussed    FALL RISK PREVENTION PERTAINING TO THE HOME:  Any stairs in or around the home? Yes  If so, are there any without handrails? No  Home free of loose throw rugs in walkways, pet beds, electrical cords, etc? Yes  Adequate lighting in your home to reduce risk of falls? Yes   ASSISTIVE DEVICES UTILIZED TO PREVENT FALLS:  Life alert? No  Use of a cane, walker or w/c? No  Grab bars in the bathroom? No  Shower chair or bench in shower? Yes  Elevated toilet seat or a handicapped toilet? No   TIMED UP AND GO:  Was the test performed? No . Audio Visit   Cognitive Function:        09/30/2022   10:40 AM 09/26/2021   10:41 AM 07/17/2020    3:31 PM  6CIT Screen  What  Year? 0 points 0 points 0 points  What month? 0 points 0 points 0 points  What time? 0 points    Count back from 20 0 points 0 points 0 points  Months in reverse 0 points 0 points 0 points  Repeat phrase 0 points 0 points 0 points  Total Score 0 points      Immunizations Immunization History  Administered Date(s) Administered   COVID-19, mRNA, vaccine(Comirnaty)12 years and older 07/07/2022   Influenza Split 06/13/2011, 07/05/2012, 06/15/2013   Influenza, High Dose Seasonal PF 06/02/2017, 05/28/2019   Influenza,inj,Quad PF,6+ Mos 06/04/2018   Influenza-Unspecified 06/24/2020   PFIZER(Purple Top)SARS-COV-2 Vaccination 10/07/2019, 10/28/2019, 06/24/2020,  06/24/2020   Pneumococcal Conjugate-13 10/18/2013   Pneumococcal Polysaccharide-23 11/01/2014    TDAP status: Up to date  Flu Vaccine status: Up to date  Pneumococcal vaccine status: Up to date  Covid-19 vaccine status: Completed vaccines  Qualifies for Shingles Vaccine? Yes   Zostavax completed No   Shingrix Completed?: No.    Education has been provided regarding the importance of this vaccine. Patient has been advised to call insurance company to determine out of pocket expense if they have not yet received this vaccine. Advised may also receive vaccine at local pharmacy or Health Dept. Verbalized acceptance and understanding.  Screening Tests Health Maintenance  Topic Date Due   DTaP/Tdap/Td (1 - Tdap) Never done   COVID-19 Vaccine (6 - 2023-24 season) 10/16/2022 (Originally 09/01/2022)   Zoster Vaccines- Shingrix (1 of 2) 12/30/2022 (Originally 10/02/1964)   COLONOSCOPY (Pts 45-62yr Insurance coverage will need to be confirmed)  12/30/2022   Medicare Annual Wellness (AWV)  10/01/2023   MAMMOGRAM  07/25/2024   Pneumonia Vaccine 75 Years old  Completed   INFLUENZA VACCINE  Completed   DEXA SCAN  Completed   Hepatitis C Screening  Completed   HPV VACCINES  Aged Out    Health Maintenance  Health Maintenance Due  Topic Date Due   DTaP/Tdap/Td (1 - Tdap) Never done    Colorectal cancer screening: Type of screening: Colonoscopy. Completed 12/30/19. Repeat every 3 years  Mammogram status: Completed 07/25/22. Repeat every year  Bone Density status: Completed 06/08/19. Results reflect: Bone density results: OSTEOPOROSIS. Repeat every   years.  Lung Cancer Screening: (Low Dose CT Chest recommended if Age 75-80years, 30 pack-year currently smoking OR have quit w/in 15years.) does not qualify.     Additional Screening:  Hepatitis C Screening: does qualify; Completed 06/02/17  Vision Screening: Recommended annual ophthalmology exams for early detection of glaucoma and  other disorders of the eye. Is the patient up to date with their annual eye exam?  Yes  Who is the provider or what is the name of the office in which the patient attends annual eye exams? Dr LPrudencio BurlyIf pt is not established with a provider, would they like to be referred to a provider to establish care? No .   Dental Screening: Recommended annual dental exams for proper oral hygiene  Community Resource Referral / Chronic Care Management:  CRR required this visit?  No   CCM required this visit?  No      Plan:     I have personally reviewed and noted the following in the patient's chart:   Medical and social history Use of alcohol, tobacco or illicit drugs  Current medications and supplements including opioid prescriptions. Patient is not currently taking opioid prescriptions. Functional ability and status Nutritional status Physical activity Advanced directives List of other physicians Hospitalizations, surgeries, and  ER visits in previous 12 months Vitals Screenings to include cognitive, depression, and falls Referrals and appointments  In addition, I have reviewed and discussed with patient certain preventive protocols, quality metrics, and best practice recommendations. A written personalized care plan for preventive services as well as general preventive health recommendations were provided to patient.     Criselda Peaches, LPN   [id number]   Nurse Notes: None

## 2022-10-06 DIAGNOSIS — M5451 Vertebrogenic low back pain: Secondary | ICD-10-CM | POA: Diagnosis not present

## 2022-10-06 DIAGNOSIS — M25552 Pain in left hip: Secondary | ICD-10-CM | POA: Diagnosis not present

## 2022-10-06 DIAGNOSIS — M79671 Pain in right foot: Secondary | ICD-10-CM | POA: Diagnosis not present

## 2022-10-06 DIAGNOSIS — M9903 Segmental and somatic dysfunction of lumbar region: Secondary | ICD-10-CM | POA: Diagnosis not present

## 2022-10-13 ENCOUNTER — Ambulatory Visit: Payer: Medicare HMO | Admitting: Internal Medicine

## 2022-10-13 DIAGNOSIS — M25552 Pain in left hip: Secondary | ICD-10-CM | POA: Diagnosis not present

## 2022-10-13 DIAGNOSIS — M79671 Pain in right foot: Secondary | ICD-10-CM | POA: Diagnosis not present

## 2022-10-13 DIAGNOSIS — M5451 Vertebrogenic low back pain: Secondary | ICD-10-CM | POA: Diagnosis not present

## 2022-10-13 DIAGNOSIS — M9903 Segmental and somatic dysfunction of lumbar region: Secondary | ICD-10-CM | POA: Diagnosis not present

## 2022-10-20 DIAGNOSIS — M25552 Pain in left hip: Secondary | ICD-10-CM | POA: Diagnosis not present

## 2022-10-20 DIAGNOSIS — M5451 Vertebrogenic low back pain: Secondary | ICD-10-CM | POA: Diagnosis not present

## 2022-10-20 DIAGNOSIS — M79671 Pain in right foot: Secondary | ICD-10-CM | POA: Diagnosis not present

## 2022-10-20 DIAGNOSIS — M9903 Segmental and somatic dysfunction of lumbar region: Secondary | ICD-10-CM | POA: Diagnosis not present

## 2022-10-27 DIAGNOSIS — M5451 Vertebrogenic low back pain: Secondary | ICD-10-CM | POA: Diagnosis not present

## 2022-10-27 DIAGNOSIS — M79671 Pain in right foot: Secondary | ICD-10-CM | POA: Diagnosis not present

## 2022-10-27 DIAGNOSIS — M9903 Segmental and somatic dysfunction of lumbar region: Secondary | ICD-10-CM | POA: Diagnosis not present

## 2022-10-27 DIAGNOSIS — M25552 Pain in left hip: Secondary | ICD-10-CM | POA: Diagnosis not present

## 2022-11-19 DIAGNOSIS — M79671 Pain in right foot: Secondary | ICD-10-CM | POA: Diagnosis not present

## 2022-11-19 DIAGNOSIS — M25552 Pain in left hip: Secondary | ICD-10-CM | POA: Diagnosis not present

## 2022-11-19 DIAGNOSIS — M9903 Segmental and somatic dysfunction of lumbar region: Secondary | ICD-10-CM | POA: Diagnosis not present

## 2022-11-19 DIAGNOSIS — M5451 Vertebrogenic low back pain: Secondary | ICD-10-CM | POA: Diagnosis not present

## 2022-11-24 NOTE — Progress Notes (Unsigned)
No chief complaint on file.   HPI: Shelley Wright 77 y.o. come in for concerns about BG Last lab 6 23  Had pv per dr Sarajane Jews in my absence Jan 24 ? ROS: See pertinent positives and negatives per HPI.  Past Medical History:  Diagnosis Date   Anxiety    no per pt   Arthritis    GERD (gastroesophageal reflux disease)    Heart murmur    'YEARS AGO"   History of smoking    History of ulcerative colitis 2005   pancolitis    Hyperlipidemia    Hypertension    Normal nuclear stress test 2008   myoview     Family History  Problem Relation Age of Onset   Pancreatic cancer Father        deceased   Hypertension Father    Breast cancer Mother 65   Angina Mother    Hypertension Mother    Colon cancer Neg Hx    Colon polyps Neg Hx    Rectal cancer Neg Hx    Stomach cancer Neg Hx     Social History   Socioeconomic History   Marital status: Married    Spouse name: Not on file   Number of children: 4   Years of education: Not on file   Highest education level: Not on file  Occupational History   Not on file  Tobacco Use   Smoking status: Former    Types: Cigarettes    Quit date: 07/04/2014    Years since quitting: 8.3   Smokeless tobacco: Never  Vaping Use   Vaping Use: Never used  Substance and Sexual Activity   Alcohol use: No    Alcohol/week: 0.0 standard drinks of alcohol   Drug use: No   Sexual activity: Not on file  Other Topics Concern   Not on file  Social History Narrative   Hh  Of 2    Pet dog    Off tobacco.   [employer].     Teacher runs school.   31 - 3    G4 P4    Husband had cva and blocked carotids    Left handed   Drinks caffeine   Two story home   Social Determinants of Health   Financial Resource Strain: Low Risk  (09/30/2022)   Overall Financial Resource Strain (CARDIA)    Difficulty of Paying Living Expenses: Not hard at all  Food Insecurity: No Food Insecurity (09/30/2022)   Hunger Vital Sign    Worried About Running Out of  Food in the Last Year: Never true    Ran Out of Food in the Last Year: Never true  Transportation Needs: No Transportation Needs (09/30/2022)   PRAPARE - Hydrologist (Medical): No    Lack of Transportation (Non-Medical): No  Physical Activity: Inactive (09/30/2022)   Exercise Vital Sign    Days of Exercise per Week: 0 days    Minutes of Exercise per Session: 0 min  Stress: No Stress Concern Present (09/30/2022)   [place]    Feeling of Stress : Not at all  Social Connections: [place] (09/30/2022)   Social Connection and Isolation Panel [NHANES]    Frequency of Communication with Friends and Family: More than three times a week    Frequency of Social Gatherings with Friends and Family: More than three times a week    Attends Religious Services:  More than 4 times per year    Active Member of Clubs or Organizations: Yes    Attends Archivist Meetings: More than 4 times per year    Marital Status: Married    Outpatient Medications Prior to Visit  Medication Sig Dispense Refill   aspirin EC 81 MG tablet Take 1 tablet (81 mg total) by mouth daily. Swallow whole. 90 tablet 3   diclofenac (VOLTAREN) 75 MG EC tablet diclofenac sodium 75 mg tablet,delayed release (Patient not taking: Reported on 05/26/2022)     escitalopram (LEXAPRO) 10 MG tablet TAKE 1 TABLET EVERY DAY 90 tablet 1   ezetimibe (ZETIA) 10 MG tablet Take 1 tablet (10 mg total) by mouth daily. 90 tablet 3   lisinopril (ZESTRIL) 10 MG tablet TAKE 1 TABLET EVERY DAY 90 tablet 3   rosuvastatin (CRESTOR) 40 MG tablet Take 1 tablet (40 mg total) by mouth daily. 90 tablet 3   Semaglutide,0.25 or 0.'5MG'$ /DOS, (OZEMPIC, 0.25 OR 0.5 MG/DOSE,) 2 MG/3ML SOPN Inject 0.5 Units into the skin once a week. 3 mL 1   No facility-administered medications prior to visit.     EXAM:  There were no vitals taken for this visit.  There  is no height or weight on file to calculate BMI.  GENERAL: vitals reviewed and listed above, alert, oriented, appears well hydrated and in no acute distress HEENT: atraumatic, conjunctiva  clear, no obvious abnormalities on inspection of external nose and ears OP : no lesion edema or exudate  NECK: no obvious masses on inspection palpation  LUNGS: clear to auscultation bilaterally, no wheezes, rales or rhonchi, good air movement CV: HRRR, no clubbing cyanosis or  peripheral edema nl cap refill  MS: moves all extremities without noticeable focal  abnormality PSYCH: pleasant and cooperative, no obvious depression or anxiety Lab Results  Component Value Date   WBC 8.1 03/13/2022   HGB 13.4 03/13/2022   HCT 41.2 03/13/2022   PLT 254.0 03/13/2022   GLUCOSE 87 03/13/2022   CHOL 200 03/13/2022   TRIG 120.0 03/13/2022   HDL 59.00 03/13/2022   LDLDIRECT 138.5 10/17/2013   LDLCALC 117 (H) 03/13/2022   ALT 20 03/13/2022   AST 24 03/13/2022   NA 141 03/13/2022   K 4.2 03/13/2022   CL 105 03/13/2022   CREATININE 0.83 03/13/2022   BUN 26 (H) 03/13/2022   CO2 29 03/13/2022   TSH 2.11 03/13/2022   HGBA1C 5.9 03/13/2022   BP Readings from Last 3 Encounters:  06/26/22 116/70  06/16/22 110/70  05/29/22 110/61    ASSESSMENT AND PLAN:  Discussed the following assessment and plan:  No diagnosis found.  -Patient advised to return or notify health care team  if  new concerns arise.  There are no Patient Instructions on file for this visit.   Standley Brooking. Cameron Katayama M.D.

## 2022-11-25 ENCOUNTER — Encounter: Payer: Self-pay | Admitting: Internal Medicine

## 2022-11-25 ENCOUNTER — Ambulatory Visit (INDEPENDENT_AMBULATORY_CARE_PROVIDER_SITE_OTHER): Payer: Medicare HMO | Admitting: Internal Medicine

## 2022-11-25 VITALS — BP 136/80 | HR 88 | Temp 97.5°F | Ht 63.75 in | Wt 144.0 lb

## 2022-11-25 DIAGNOSIS — E785 Hyperlipidemia, unspecified: Secondary | ICD-10-CM

## 2022-11-25 DIAGNOSIS — M7072 Other bursitis of hip, left hip: Secondary | ICD-10-CM

## 2022-11-25 DIAGNOSIS — N907 Vulvar cyst: Secondary | ICD-10-CM

## 2022-11-25 DIAGNOSIS — Z79899 Other long term (current) drug therapy: Secondary | ICD-10-CM

## 2022-11-25 DIAGNOSIS — R7303 Prediabetes: Secondary | ICD-10-CM

## 2022-11-25 LAB — LIPID PANEL
Cholesterol: 156 mg/dL (ref 0–200)
HDL: 69 mg/dL (ref >39.00)
LDL Cholesterol: 69 mg/dL (ref 0–99)
NonHDL: 86.89
Total CHOL/HDL Ratio: 2
Triglycerides: 88 mg/dL (ref 0.0–149.0)
VLDL: 17.6 mg/dL (ref 0.0–40.0)

## 2022-11-25 LAB — COMPREHENSIVE METABOLIC PANEL
ALT: 25 U/L (ref 0–35)
AST: 27 U/L (ref 0–37)
Albumin: 4.3 g/dL (ref 3.5–5.2)
Alkaline Phosphatase: 46 U/L (ref 39–117)
BUN: 26 mg/dL — ABNORMAL HIGH (ref 6–23)
CO2: 30 mEq/L (ref 19–32)
Calcium: 10.2 mg/dL (ref 8.4–10.5)
Chloride: 102 mEq/L (ref 96–112)
Creatinine, Ser: 0.91 mg/dL (ref 0.40–1.20)
GFR: 61.87 mL/min (ref >60.00)
Glucose, Bld: 94 mg/dL (ref 70–99)
Potassium: 4.1 mEq/L (ref 3.5–5.1)
Sodium: 140 mEq/L (ref 135–145)
Total Bilirubin: 0.7 mg/dL (ref 0.2–1.2)
Total Protein: 7.3 g/dL (ref 6.0–8.3)

## 2022-11-25 LAB — POCT GLYCOSYLATED HEMOGLOBIN (HGB A1C): Hemoglobin A1C: 5.5 % (ref 4.0–5.6)

## 2022-11-25 MED ORDER — TIRZEPATIDE 2.5 MG/0.5ML ~~LOC~~ SOAJ: 2.5000 mg | SUBCUTANEOUS | 1 refills | Status: DC

## 2022-11-25 NOTE — Progress Notes (Signed)
Lab results are good  A1c  improved  now to 5.5  I will send in rx for mounjaro dx pre diabetes   not sure if covered   if so then fu visit virtual in 1 month onmed

## 2022-11-25 NOTE — Patient Instructions (Addendum)
Lab today .  Unknown risk caution with restarting ozempic glp 1 as discussed but advise against if getting vomiting .   Uncertain if mounjaro would cause same problem .  Advise getting all labs results back first.   The bump on labia is a  cyst and benign . Let us know if growing or other  problem Will do referral   to Dr Tamala Julian sports medicine about the problem with left hip  that is  limiting your acitvity.

## 2022-11-26 ENCOUNTER — Telehealth: Payer: Self-pay | Admitting: Internal Medicine

## 2022-11-26 NOTE — Telephone Encounter (Signed)
error 

## 2022-12-03 DIAGNOSIS — M5451 Vertebrogenic low back pain: Secondary | ICD-10-CM | POA: Diagnosis not present

## 2022-12-03 DIAGNOSIS — M9901 Segmental and somatic dysfunction of cervical region: Secondary | ICD-10-CM | POA: Diagnosis not present

## 2022-12-03 DIAGNOSIS — M25552 Pain in left hip: Secondary | ICD-10-CM | POA: Diagnosis not present

## 2022-12-03 DIAGNOSIS — M9903 Segmental and somatic dysfunction of lumbar region: Secondary | ICD-10-CM | POA: Diagnosis not present

## 2022-12-04 ENCOUNTER — Ambulatory Visit (INDEPENDENT_AMBULATORY_CARE_PROVIDER_SITE_OTHER): Payer: Medicare HMO | Admitting: Physician Assistant

## 2022-12-04 ENCOUNTER — Encounter: Payer: Self-pay | Admitting: Physician Assistant

## 2022-12-04 VITALS — BP 122/80 | HR 93 | Temp 97.8°F | Ht 63.75 in | Wt 151.0 lb

## 2022-12-04 DIAGNOSIS — R0981 Nasal congestion: Secondary | ICD-10-CM | POA: Diagnosis not present

## 2022-12-04 DIAGNOSIS — J029 Acute pharyngitis, unspecified: Secondary | ICD-10-CM | POA: Diagnosis not present

## 2022-12-04 LAB — POC COVID19 BINAXNOW: SARS Coronavirus 2 Ag: NEGATIVE

## 2022-12-04 LAB — POCT RAPID STREP A (OFFICE): Rapid Strep A Screen: NEGATIVE

## 2022-12-04 MED ORDER — BENZONATATE 100 MG PO CAPS: 100.0000 mg | ORAL_CAPSULE | Freq: Two times a day (BID) | ORAL | 0 refills | Status: DC | PRN

## 2022-12-04 NOTE — Progress Notes (Signed)
Shelley Wright is a 77 y.o. female here for a new problem.  History of Present Illness:   Chief Complaint  Patient presents with   Sore Throat    Pt c/o sore throat x 2 days, runny nose, nasal congestion, clear nasal drainage, cough. Denies fever or chills. Has taken Sudafed, OTC cough medicine.    HPI  Sore throat:  She complains of a sore throat, nasal congestion, shortness of breath, and deep cough for about 3 days.  She also endorses difficulty swallowing and trouble sleeping.  She has been taking Pseudoephedrine and Delsym and notes it is not relieving symptoms.  She denies any ear pain or pressure.  She has tested negative for Covid and strep in clinic.   Past Medical History:  Diagnosis Date   Anxiety    no per pt   Arthritis    GERD (gastroesophageal reflux disease)    Heart murmur    'YEARS AGO"   History of smoking    History of ulcerative colitis 2005   pancolitis    Hyperlipidemia    Hypertension    Normal nuclear stress test 2008   myoview      Social History   Tobacco Use   Smoking status: Former    Types: Cigarettes    Quit date: 07/04/2014    Years since quitting: 8.4   Smokeless tobacco: Never  Vaping Use   Vaping Use: Never used  Substance Use Topics   Alcohol use: No    Alcohol/week: 0.0 standard drinks of alcohol   Drug use: No    Past Surgical History:  Procedure Laterality Date   BREAST BIOPSY Left 1986   biopsy x 2    COLONOSCOPY     KNEE SURGERY Left october   x 2    Family History  Problem Relation Age of Onset   Pancreatic cancer Father        deceased   Hypertension Father    Breast cancer Mother 52   Angina Mother    Hypertension Mother    Colon cancer Neg Hx    Colon polyps Neg Hx    Rectal cancer Neg Hx    Stomach cancer Neg Hx     No Known Allergies  Current Medications:   Current Outpatient Medications:    diclofenac (VOLTAREN) 75 MG EC tablet, , Disp: , Rfl:    escitalopram (LEXAPRO) 10 MG tablet, TAKE 1  TABLET EVERY DAY, Disp: 90 tablet, Rfl: 1   ezetimibe (ZETIA) 10 MG tablet, Take 1 tablet (10 mg total) by mouth daily., Disp: 90 tablet, Rfl: 3   lisinopril (ZESTRIL) 10 MG tablet, TAKE 1 TABLET EVERY DAY, Disp: 90 tablet, Rfl: 3   rosuvastatin (CRESTOR) 40 MG tablet, Take 1 tablet (40 mg total) by mouth daily., Disp: 90 tablet, Rfl: 3   tirzepatide (MOUNJARO) 2.5 MG/0.5ML Pen, Inject 2.5 mg into the skin once a week. (Patient not taking: Reported on 12/04/2022), Disp: 2 mL, Rfl: 1   Review of Systems:   Review of Systems  HENT:  Positive for congestion (nasal) and sore throat.   Respiratory:  Positive for cough (deep) and shortness of breath.     Vitals:   Vitals:   12/04/22 1032  BP: 122/80  Pulse: 93  Temp: 97.8 F (36.6 C)  TempSrc: Temporal  SpO2: 96%  Weight: 151 lb (68.5 kg)  Height: 5' 3.75" (1.619 m)     Body mass index is 26.12 kg/m.  Physical Exam:  Physical Exam Vitals and nursing note reviewed.  Constitutional:      General: She is not in acute distress.    Appearance: She is well-developed. She is not ill-appearing or toxic-appearing.  HENT:     Head: Normocephalic and atraumatic.     Right Ear: Tympanic membrane, ear canal and external ear normal. Tympanic membrane is not erythematous, retracted or bulging.     Left Ear: Tympanic membrane, ear canal and external ear normal. Tympanic membrane is not erythematous, retracted or bulging.     Nose: Nose normal.     Right Sinus: No maxillary sinus tenderness or frontal sinus tenderness.     Left Sinus: No maxillary sinus tenderness or frontal sinus tenderness.     Mouth/Throat:     Pharynx: Uvula midline. Posterior oropharyngeal erythema present.     Tonsils: No tonsillar exudate. 1+ on the right. 1+ on the left.  Eyes:     General: Lids are normal.     Conjunctiva/sclera: Conjunctivae normal.  Neck:     Trachea: Trachea normal.  Cardiovascular:     Rate and Rhythm: Normal rate and regular rhythm.      Heart sounds: Normal heart sounds, S1 normal and S2 normal.  Pulmonary:     Effort: Pulmonary effort is normal.     Breath sounds: Normal breath sounds. No decreased breath sounds, wheezing, rhonchi or rales.  Lymphadenopathy:     Cervical: No cervical adenopathy.  Skin:    General: Skin is warm and dry.  Neurological:     Mental Status: She is alert.  Psychiatric:        Speech: Speech normal.        Behavior: Behavior normal. Behavior is cooperative.    Results for orders placed or performed in visit on 12/04/22  POCT rapid strep A  Result Value Ref Range   Rapid Strep A Screen Negative Negative  POC COVID-19  Result Value Ref Range   SARS Coronavirus 2 Ag Negative Negative     Assessment and Plan:   Sore throat No red flags on exam.   Suspect viral URI. COVID and strep negative Will initiate tessalon perles for cough per orders. Discussed taking medications as prescribed. Reviewed return precautions including worsening fever, SOB, worsening cough or other concerns. Push fluids and rest. I recommend that patient follow-up if symptoms worsen or persist despite treatment x 7-10 days, sooner if needed.    I,Rachel Rivera,acting as a Education administrator for [employer], PA.,have documented all relevant documentation on the behalf of Inda Coke, PA,as directed by  Inda Coke, PA while in the presence of Inda Coke, [place].  Inda Coke, PA-C

## 2022-12-08 ENCOUNTER — Other Ambulatory Visit: Payer: Self-pay

## 2022-12-08 ENCOUNTER — Telehealth: Payer: Self-pay | Admitting: Internal Medicine

## 2022-12-08 DIAGNOSIS — R7303 Prediabetes: Secondary | ICD-10-CM

## 2022-12-08 MED ORDER — TIRZEPATIDE 2.5 MG/0.5ML ~~LOC~~ SOAJ: 2.5000 mg | SUBCUTANEOUS | 1 refills | Status: DC

## 2022-12-08 NOTE — Telephone Encounter (Signed)
Prescription resent to [facility]

## 2022-12-08 NOTE — Telephone Encounter (Signed)
Patient requesting tirzepatide [name]) 2.5 MG/0.5ML Pen be rerouted to  [facility], [place] Phone: [phone]  Fax: [phone]

## 2022-12-10 ENCOUNTER — Ambulatory Visit (HOSPITAL_BASED_OUTPATIENT_CLINIC_OR_DEPARTMENT_OTHER): Payer: Medicare HMO | Admitting: Cardiology

## 2022-12-16 ENCOUNTER — Telehealth: Payer: Self-pay

## 2022-12-16 NOTE — Telephone Encounter (Signed)
Pharmacy Patient Advocate Encounter  Received notification from [facility] that the request for prior authorization for [name] has been denied due to it being used for prediabetes. This is an off-label use that is not medically accepted.    Please be advised we currently do not have a Pharmacist to review denials, therefore you will need to process appeals accordingly as needed. Thanks for your support at this time.   You may call [phone] or fax [phone], to appeal.   Denial letter attached to chart

## 2022-12-17 DIAGNOSIS — M25552 Pain in left hip: Secondary | ICD-10-CM | POA: Diagnosis not present

## 2022-12-17 DIAGNOSIS — M5451 Vertebrogenic low back pain: Secondary | ICD-10-CM | POA: Diagnosis not present

## 2022-12-17 DIAGNOSIS — M9903 Segmental and somatic dysfunction of lumbar region: Secondary | ICD-10-CM | POA: Diagnosis not present

## 2022-12-17 DIAGNOSIS — M9901 Segmental and somatic dysfunction of cervical region: Secondary | ICD-10-CM | POA: Diagnosis not present

## 2022-12-20 NOTE — Telephone Encounter (Signed)
Tell patient of denial because she doesn't have diabetes.

## 2022-12-23 NOTE — Telephone Encounter (Signed)
Attempted to reach pt. Left a voicemail to call us back.  

## 2022-12-26 NOTE — Telephone Encounter (Signed)
Inform pt of message from Dr. Fabian Sharp. Verbalized understanding.  Pt states she will discuss with provider at he office visit regarding to medication. Right now, no further action is needed.

## 2022-12-29 ENCOUNTER — Other Ambulatory Visit: Payer: Self-pay | Admitting: Internal Medicine

## 2023-01-05 DIAGNOSIS — M9901 Segmental and somatic dysfunction of cervical region: Secondary | ICD-10-CM | POA: Diagnosis not present

## 2023-01-05 DIAGNOSIS — M9903 Segmental and somatic dysfunction of lumbar region: Secondary | ICD-10-CM | POA: Diagnosis not present

## 2023-01-05 DIAGNOSIS — M25552 Pain in left hip: Secondary | ICD-10-CM | POA: Diagnosis not present

## 2023-01-05 DIAGNOSIS — M5451 Vertebrogenic low back pain: Secondary | ICD-10-CM | POA: Diagnosis not present

## 2023-01-06 NOTE — Progress Notes (Unsigned)
[facility] [address] [place] [address] Phone: [phone] Subjective:   INadine Counts, am serving as a scribe for Dr. Antoine Primas.  I'm seeing this patient by the request  of:  Panosh, Neta Mends, MD  CC: hip pain   [id number]  Shelley Wright is a 77 y.o. female coming in with complaint of hip pain. Patient states L hip pain. Was told that it is bursitis. Has gotten prednisone shots for a year, PT for 2 years. Now going to chiropractor which is helping. Doing some at home exercises and takes tylenol when necessary. Walking aggravates it the most. Pain over GT. Pain used to be more intense.  Per outside records patient received a chiropractor as well as going to formal physical therapy.    Past Medical History:  Diagnosis Date   Anxiety    no per pt   Arthritis    GERD (gastroesophageal reflux disease)    Heart murmur    'YEARS AGO"   History of smoking    History of ulcerative colitis 2005   pancolitis    Hyperlipidemia    Hypertension    Normal nuclear stress test 2008   myoview    Past Surgical History:  Procedure Laterality Date   BREAST BIOPSY Left 1986   biopsy x 2    COLONOSCOPY     KNEE SURGERY Left october   x 2   Social History   Socioeconomic History   Marital status: Married    Spouse name: Not on file   Number of children: 4   Years of education: Not on file   Highest education level: Not on file  Occupational History   Not on file  Tobacco Use   Smoking status: Former    Types: Cigarettes    Quit date: 07/04/2014    Years since quitting: 8.5   Smokeless tobacco: Never  Vaping Use   Vaping Use: Never used  Substance and Sexual Activity   Alcohol use: No    Alcohol/week: 0.0 standard drinks of alcohol   Drug use: No   Sexual activity: Not on file  Other Topics Concern   Not on file  Social History Narrative   Hh  Of 2    Pet dog    Off tobacco.   [employer].     Teacher runs school.    8 - 3    G4 P4    Husband had cva and blocked carotids    Left handed   Drinks caffeine   Two story home   Social Determinants of Health   Financial Resource Strain: Low Risk  (09/30/2022)   Overall Financial Resource Strain (CARDIA)    Difficulty of Paying Living Expenses: Not hard at all  Food Insecurity: No Food Insecurity (09/30/2022)   Hunger Vital Sign    Worried About Running Out of Food in the Last Year: Never true    Ran Out of Food in the Last Year: Never true  Transportation Needs: No Transportation Needs (09/30/2022)   PRAPARE - Administrator, Civil Service (Medical): No    Lack of Transportation (Non-Medical): No  Physical Activity: Inactive (09/30/2022)   Exercise Vital Sign    Days of Exercise per Week: 0 days    Minutes of Exercise per Session: 0 min  Stress: No Stress Concern Present (09/30/2022)   [employer] of Occupational Health - Occupational Stress Questionnaire    Feeling of Stress :  Not at all  Social Connections: Socially Integrated (09/30/2022)   Social Connection and Isolation Panel [NHANES]    Frequency of Communication with Friends and Family: More than three times a week    Frequency of Social Gatherings with Friends and Family: More than three times a week    Attends Religious Services: More than 4 times per year    Active Member of [employer] or Organizations: Yes    Attends Engineer, structural: More than 4 times per year    Marital Status: Married   No Known Allergies Family History  Problem Relation Age of Onset   Pancreatic cancer Father        deceased   Hypertension Father    Breast cancer Mother 64   Angina Mother    Hypertension Mother    Colon cancer Neg Hx    Colon polyps Neg Hx    Rectal cancer Neg Hx    Stomach cancer Neg Hx     Current Outpatient Medications (Endocrine & Metabolic):    tirzepatide (MOUNJARO) 2.5 MG/0.5ML Pen, Inject 2.5 mg into the skin once a week.  Current Outpatient Medications  (Cardiovascular):    ezetimibe (ZETIA) 10 MG tablet, Take 1 tablet (10 mg total) by mouth daily.   lisinopril (ZESTRIL) 10 MG tablet, TAKE 1 TABLET EVERY DAY   rosuvastatin (CRESTOR) 40 MG tablet, Take 1 tablet (40 mg total) by mouth daily.  Current Outpatient Medications (Respiratory):    benzonatate (TESSALON) 100 MG capsule, Take 1 capsule (100 mg total) by mouth 2 (two) times daily as needed for cough.  Current Outpatient Medications (Analgesics):    diclofenac (VOLTAREN) 75 MG EC tablet,    Current Outpatient Medications (Other):    escitalopram (LEXAPRO) 10 MG tablet, TAKE 1 TABLET EVERY DAY   Reviewed prior external information including notes and imaging from  primary care provider As well as notes that were available from care everywhere and other healthcare systems.  Past medical history, social, surgical and family history all reviewed in electronic medical record.  No pertanent information unless stated regarding to the chief complaint.   Review of Systems:  No headache, visual changes, nausea, vomiting, diarrhea, constipation, dizziness, abdominal pain, skin rash, fevers, chills, night sweats, weight loss, swollen lymph nodes, body aches, joint swelling, chest pain, shortness of breath, mood changes. POSITIVE muscle aches  Objective  Blood pressure 114/76, pulse 85, height  (1.6 m), weight 147 lb (66.7 kg), SpO2 96 %.   General: No apparent distress alert and oriented x3 mood and affect normal, dressed appropriately.  HEENT: Pupils equal, extraocular movements intact  Respiratory: Patient's speak in full sentences and does not appear short of breath  Cardiovascular: No lower extremity edema, non tender, no erythema  Hip exam shows tenderness to palpation that is fairly severe over the bilateral greater trochanteric area.  No weakness with hip abductor strength noted 3 out of 5 compared to 4 out of 5 on the contralateral side.  Tightness with FABER test.  Negative  straight leg test.  Very minimal tenderness over the sacroiliac joint.  Antalgic gait noted with instability over the left knee   Procedure: Real-time Ultrasound Guided Injection of left  greater trochanteric bursitis secondary to patient's body habitus Device: GE Logiq Q7  Ultrasound guided injection is preferred based studies that show increased duration, increased effect, greater accuracy, decreased procedural pain, increased response rate, and decreased cost with ultrasound guided versus blind injection.  Verbal informed consent obtained.  Time-out  conducted.  Noted no overlying erythema, induration, or other signs of local infection.  Skin prepped in a sterile fashion.  Local anesthesia: Topical Ethyl chloride.  With sterile technique and under real time ultrasound guidance:  Greater trochanteric area was visualized and patient's bursa was noted. A 22-gauge 3 inch needle was inserted and 4 cc of 0.5% Marcaine and 1 cc of Kenalog 40 mg/dL was injected. Pictures taken Completed without difficulty  Pain immediately resolved suggesting accurate placement of the medication.  Advised to call if fevers/chills, erythema, induration, drainage, or persistent bleeding.  Impression: Technically successful ultrasound guided injection.  [id number]; 15 additional minutes spent for Therapeutic exercises as stated in above notes.  This included exercises focusing on stretching, strengthening, with significant focus on eccentric aspects.   Long term goals include an improvement in range of motion, strength, endurance as well as avoiding reinjury. Patient's frequency would include in 1-2 times a day, 3-5 times a week for a duration of 6-12 weeks.  Low back exercises that included:  Pelvic tilt/bracing instruction to focus on control of the pelvic girdle and lower abdominal muscles  Glute strengthening exercises, focusing on proper firing of the glutes without engaging the low back muscles Proper stretching  techniques for maximum relief for the hamstrings, hip flexors, low back and some rotation where tolerated Proper technique shown and discussed handout in great detail with ATC.  All questions were discussed and answered.      Impression and Recommendations:     The above documentation has been reviewed and is accurate and complete Judi Saa, DO

## 2023-01-07 ENCOUNTER — Ambulatory Visit: Payer: Self-pay

## 2023-01-07 ENCOUNTER — Ambulatory Visit (INDEPENDENT_AMBULATORY_CARE_PROVIDER_SITE_OTHER): Payer: Medicare HMO

## 2023-01-07 ENCOUNTER — Encounter: Payer: Self-pay | Admitting: Family Medicine

## 2023-01-07 ENCOUNTER — Ambulatory Visit: Payer: Medicare HMO | Admitting: Family Medicine

## 2023-01-07 VITALS — BP 114/76 | HR 85 | Ht 63.0 in | Wt 147.0 lb

## 2023-01-07 DIAGNOSIS — M25552 Pain in left hip: Secondary | ICD-10-CM

## 2023-01-07 DIAGNOSIS — M2352 Chronic instability of knee, left knee: Secondary | ICD-10-CM | POA: Diagnosis not present

## 2023-01-07 DIAGNOSIS — M7062 Trochanteric bursitis, left hip: Secondary | ICD-10-CM

## 2023-01-07 NOTE — Assessment & Plan Note (Signed)
Injection given.  Discussed icing regimen and home exercises.  Instability of the left knee noted.  The patient declined any type of bracing.  Do feel that this is contributing.  Differential includes lumbar radiculopathy and we can monitor.  Discussed topical anti-inflammatories icing regimen and home exercises.  Follow-up with me again in 6 to 8 weeks worsening pain may consider aquatic therapy and consider physical therapy and advanced imaging with patient seeing multiple providers over the course of years for this

## 2023-01-07 NOTE — Patient Instructions (Signed)
Great to meet you Injected the left side of your hip for bursitis New exercises given today and do 3 times a week Ice 20 minutes after activity and before bed Voltaren topically up to twice a day can be helpful X-rays on the way out Follow-up with me again in 6 to 8 weeks

## 2023-01-07 NOTE — Assessment & Plan Note (Signed)
ACL deficient knee that likely is contributing and will need to continue to monitor.  Patient declined bracing at this time

## 2023-01-14 ENCOUNTER — Encounter: Payer: Self-pay | Admitting: Gastroenterology

## 2023-01-23 ENCOUNTER — Encounter: Payer: Self-pay | Admitting: Physician Assistant

## 2023-01-26 DIAGNOSIS — M9901 Segmental and somatic dysfunction of cervical region: Secondary | ICD-10-CM | POA: Diagnosis not present

## 2023-01-26 DIAGNOSIS — M5451 Vertebrogenic low back pain: Secondary | ICD-10-CM | POA: Diagnosis not present

## 2023-01-26 DIAGNOSIS — M9903 Segmental and somatic dysfunction of lumbar region: Secondary | ICD-10-CM | POA: Diagnosis not present

## 2023-01-26 DIAGNOSIS — M25552 Pain in left hip: Secondary | ICD-10-CM | POA: Diagnosis not present

## 2023-02-10 DIAGNOSIS — M9903 Segmental and somatic dysfunction of lumbar region: Secondary | ICD-10-CM | POA: Diagnosis not present

## 2023-02-10 DIAGNOSIS — M9901 Segmental and somatic dysfunction of cervical region: Secondary | ICD-10-CM | POA: Diagnosis not present

## 2023-02-10 DIAGNOSIS — M5451 Vertebrogenic low back pain: Secondary | ICD-10-CM | POA: Diagnosis not present

## 2023-02-10 DIAGNOSIS — M25552 Pain in left hip: Secondary | ICD-10-CM | POA: Diagnosis not present

## 2023-02-24 NOTE — Progress Notes (Unsigned)
[facility] [address] [place] [address] Phone: [phone] Subjective:   [name], am serving as a scribe for Dr. Antoine Primas.  I'm seeing this patient by the request  of:  Panosh, Neta Mends, MD  CC:   [id number]  01/07/2023 ACL deficient knee that likely is contributing and will need to continue to monitor.  Patient declined bracing at this time     Injection given.  Discussed icing regimen and home exercises.  Instability of the left knee noted.  The patient declined any type of bracing.  Do feel that this is contributing.  Differential includes lumbar radiculopathy and we can monitor.  Discussed topical anti-inflammatories icing regimen and home exercises.  Follow-up with me again in 6 to 8 weeks worsening pain may consider aquatic therapy and consider physical therapy and advanced imaging with patient seeing multiple providers over the course of years for this      Update 02/25/2023 Shelley Wright is a 77 y.o. female coming in with complaint of L hip and L knee pain. Patient states that her hip pain returned a couple of days after her injection. L knee seems to shift a lot during activity.        Past Medical History:  Diagnosis Date   Anxiety    no per pt   Arthritis    GERD (gastroesophageal reflux disease)    Heart murmur    'YEARS AGO"   History of smoking    History of ulcerative colitis 2005   pancolitis    Hyperlipidemia    Hypertension    Normal nuclear stress test 2008   myoview    Past Surgical History:  Procedure Laterality Date   BREAST BIOPSY Left 1986   biopsy x 2    COLONOSCOPY     KNEE SURGERY Left october   x 2   Social History   Socioeconomic History   Marital status: Married    Spouse name: Not on file   Number of children: 4   Years of education: Not on file   Highest education level: Not on file  Occupational History   Not on file  Tobacco Use   Smoking status: Former    Types:  Cigarettes    Quit date: 07/04/2014    Years since quitting: 8.6   Smokeless tobacco: Never  Vaping Use   Vaping Use: Never used  Substance and Sexual Activity   Alcohol use: No    Alcohol/week: 0.0 standard drinks of alcohol   Drug use: No   Sexual activity: Not on file  Other Topics Concern   Not on file  Social History Narrative   Hh  Of 2    Pet dog    Off tobacco.   [employer].     Teacher runs school.   8 - 3    G4 P4    Husband had cva and blocked carotids    Left handed   Drinks caffeine   Two story home   Social Determinants of Health   Financial Resource Strain: Low Risk  (09/30/2022)   Overall Financial Resource Strain (CARDIA)    Difficulty of Paying Living Expenses: Not hard at all  Food Insecurity: No Food Insecurity (09/30/2022)   Hunger Vital Sign    Worried About Running Out of Food in the Last Year: Never true    Ran Out of Food in the Last Year: Never true  Transportation Needs: No Transportation  Needs (09/30/2022)   PRAPARE - Administrator, Civil Service (Medical): No    Lack of Transportation (Non-Medical): No  Physical Activity: Inactive (09/30/2022)   Exercise Vital Sign    Days of Exercise per Week: 0 days    Minutes of Exercise per Session: 0 min  Stress: No Stress Concern Present (09/30/2022)   [employer] of Occupational Health - Occupational Stress Questionnaire    Feeling of Stress : Not at all  Social Connections: Socially Integrated (09/30/2022)   Social Connection and Isolation Panel [NHANES]    Frequency of Communication with Friends and Family: More than three times a week    Frequency of Social Gatherings with Friends and Family: More than three times a week    Attends Religious Services: More than 4 times per year    Active Member of [employer] or Organizations: Yes    Attends Engineer, structural: More than 4 times per year    Marital Status: Married   No Known Allergies Family History  Problem Relation  Age of Onset   Pancreatic cancer Father        deceased   Hypertension Father    Breast cancer Mother 85   Angina Mother    Hypertension Mother    Colon cancer Neg Hx    Colon polyps Neg Hx    Rectal cancer Neg Hx    Stomach cancer Neg Hx     Current Outpatient Medications (Endocrine & Metabolic):    tirzepatide (MOUNJARO) 2.5 MG/0.5ML Pen, Inject 2.5 mg into the skin once a week.  Current Outpatient Medications (Cardiovascular):    ezetimibe (ZETIA) 10 MG tablet, Take 1 tablet (10 mg total) by mouth daily.   lisinopril (ZESTRIL) 10 MG tablet, TAKE 1 TABLET EVERY DAY   rosuvastatin (CRESTOR) 40 MG tablet, Take 1 tablet (40 mg total) by mouth daily.  Current Outpatient Medications (Respiratory):    benzonatate (TESSALON) 100 MG capsule, Take 1 capsule (100 mg total) by mouth 2 (two) times daily as needed for cough.  Current Outpatient Medications (Analgesics):    diclofenac (VOLTAREN) 75 MG EC tablet,    Current Outpatient Medications (Other):    escitalopram (LEXAPRO) 10 MG tablet, TAKE 1 TABLET EVERY DAY   gabapentin (NEURONTIN) 100 MG capsule, Take 2 capsules (200 mg total) by mouth at bedtime.   Reviewed prior external information including notes and imaging from  primary care provider As well as notes that were available from care everywhere and other healthcare systems.  Past medical history, social, surgical and family history all reviewed in electronic medical record.  No pertanent information unless stated regarding to the chief complaint.   Review of Systems:  No headache, visual changes, nausea, vomiting, diarrhea, constipation, dizziness, abdominal pain, skin rash, fevers, chills, night sweats, weight loss, swollen lymph nodes, body aches, joint swelling, chest pain, shortness of breath, mood changes. POSITIVE muscle aches  Objective  Blood pressure 120/82, pulse 78, height 5\' 3"  (1.6 m), weight 147 lb (66.7 kg), SpO2 98 %.   General: No apparent distress alert  and oriented x3 mood and affect normal, dressed appropriately.  HEENT: Pupils equal, extraocular movements intact  Respiratory: Patient's speak in full sentences and does not appear short of breath  Cardiovascular: No lower extremity edema, non tender, no erythema  Left knee continues to have instability noted.  Still has some tenderness over the greater trochanteric area.  Tightness noted in the low back.  Tenderness over the L3 and L4  paraspinal musculature left greater than right.    Impression and Recommendations:     The above documentation has been reviewed and is accurate and complete Judi Saa, DO

## 2023-02-25 ENCOUNTER — Ambulatory Visit (INDEPENDENT_AMBULATORY_CARE_PROVIDER_SITE_OTHER): Payer: Medicare HMO

## 2023-02-25 ENCOUNTER — Ambulatory Visit: Payer: Medicare HMO | Admitting: Family Medicine

## 2023-02-25 ENCOUNTER — Encounter: Payer: Self-pay | Admitting: Family Medicine

## 2023-02-25 VITALS — BP 120/82 | HR 78 | Ht 63.0 in | Wt 147.0 lb

## 2023-02-25 DIAGNOSIS — M545 Low back pain, unspecified: Secondary | ICD-10-CM

## 2023-02-25 DIAGNOSIS — M2352 Chronic instability of knee, left knee: Secondary | ICD-10-CM | POA: Diagnosis not present

## 2023-02-25 DIAGNOSIS — M47816 Spondylosis without myelopathy or radiculopathy, lumbar region: Secondary | ICD-10-CM | POA: Diagnosis not present

## 2023-02-25 MED ORDER — GABAPENTIN 100 MG PO CAPS: 200.0000 mg | ORAL_CAPSULE | Freq: Every day | ORAL | 0 refills | Status: DC

## 2023-02-25 NOTE — Patient Instructions (Addendum)
Xray today Gabapentin 200mg  Continue the back exercises but avoid extension See you again in 8 weeks

## 2023-02-25 NOTE — Assessment & Plan Note (Signed)
Concern more for a radicular symptoms.  From the lumbar spine.  Started on a low-dose of gabapentin.  Warned of potential side effects but should help out significantly.  Will continue to work on core strengthening and hip abductor strengthening.  Follow-up again in 8 weeks.  Worsening pain do need to consider MRI of the lumbar and hip.  X-rays of the lumbar spine ordered today as well with patient's past medical history of ulcerative colitis.

## 2023-02-25 NOTE — Assessment & Plan Note (Signed)
Do feel that this is playing a role and potentially putting more pressure on her back as well as her hip.  Still declines to do a brace but would highly recommend and OA stability brace in the long run.  Patient wants to see how she does otherwise.  Follow-up with me again in 8 weeks

## 2023-03-02 ENCOUNTER — Telehealth: Payer: Self-pay | Admitting: Internal Medicine

## 2023-03-02 NOTE — Telephone Encounter (Signed)
Says insurance will not cover tirzepatide [facility]) 2.5 MG/0.5ML Pen for weight loss. Requesting a prescription for [facility]

## 2023-03-10 DIAGNOSIS — H02831 Dermatochalasis of right upper eyelid: Secondary | ICD-10-CM | POA: Diagnosis not present

## 2023-03-10 DIAGNOSIS — H02834 Dermatochalasis of left upper eyelid: Secondary | ICD-10-CM | POA: Diagnosis not present

## 2023-03-12 DIAGNOSIS — M9901 Segmental and somatic dysfunction of cervical region: Secondary | ICD-10-CM | POA: Diagnosis not present

## 2023-03-12 DIAGNOSIS — M25552 Pain in left hip: Secondary | ICD-10-CM | POA: Diagnosis not present

## 2023-03-12 DIAGNOSIS — M5451 Vertebrogenic low back pain: Secondary | ICD-10-CM | POA: Diagnosis not present

## 2023-03-12 DIAGNOSIS — M9903 Segmental and somatic dysfunction of lumbar region: Secondary | ICD-10-CM | POA: Diagnosis not present

## 2023-04-03 DIAGNOSIS — M9903 Segmental and somatic dysfunction of lumbar region: Secondary | ICD-10-CM | POA: Diagnosis not present

## 2023-04-03 DIAGNOSIS — M5451 Vertebrogenic low back pain: Secondary | ICD-10-CM | POA: Diagnosis not present

## 2023-04-03 DIAGNOSIS — M25552 Pain in left hip: Secondary | ICD-10-CM | POA: Diagnosis not present

## 2023-04-03 DIAGNOSIS — M9901 Segmental and somatic dysfunction of cervical region: Secondary | ICD-10-CM | POA: Diagnosis not present

## 2023-04-06 ENCOUNTER — Ambulatory Visit: Payer: Medicare HMO | Admitting: Physician Assistant

## 2023-04-13 ENCOUNTER — Other Ambulatory Visit (INDEPENDENT_AMBULATORY_CARE_PROVIDER_SITE_OTHER): Payer: Medicare HMO

## 2023-04-13 ENCOUNTER — Encounter: Payer: Self-pay | Admitting: Nurse Practitioner

## 2023-04-13 ENCOUNTER — Ambulatory Visit (INDEPENDENT_AMBULATORY_CARE_PROVIDER_SITE_OTHER): Payer: Medicare HMO | Admitting: Nurse Practitioner

## 2023-04-13 VITALS — BP 120/80 | HR 80 | Ht 63.0 in | Wt 154.4 lb

## 2023-04-13 DIAGNOSIS — K51919 Ulcerative colitis, unspecified with unspecified complications: Secondary | ICD-10-CM

## 2023-04-13 DIAGNOSIS — K219 Gastro-esophageal reflux disease without esophagitis: Secondary | ICD-10-CM

## 2023-04-13 LAB — CBC
HCT: 39.1 % (ref 36.0–46.0)
Hemoglobin: 12.7 g/dL (ref 12.0–15.0)
MCHC: 32.3 g/dL (ref 30.0–36.0)
MCV: 96.2 fl (ref 78.0–100.0)
Platelets: 238 10*3/uL (ref 150.0–400.0)
RBC: 4.07 Mil/uL (ref 3.87–5.11)
RDW: 15 % (ref 11.5–15.5)
WBC: 10.9 10*3/uL — ABNORMAL HIGH (ref 4.0–10.5)

## 2023-04-13 LAB — C-REACTIVE PROTEIN: CRP: <1 mg/dL (ref 0.5–20.0)

## 2023-04-13 MED ORDER — NA SULFATE-K SULFATE-MG SULF 17.5-3.13-1.6 GM/177ML PO SOLN: 1.0000 | Freq: Once | ORAL | 0 refills | Status: AC

## 2023-04-13 MED ORDER — FAMOTIDINE 20 MG PO TABS: 20.0000 mg | ORAL_TABLET | Freq: Every day | ORAL | 2 refills | Status: DC

## 2023-04-13 NOTE — Patient Instructions (Addendum)
You have been scheduled for an endoscopy and colonoscopy. Please follow the written instructions given to you at your visit today.  Please pick up your prep supplies at the pharmacy within the next 1-3 days.  If you use inhalers (even only as needed), please bring them with you on the day of your procedure.  DO NOT TAKE 7 DAYS PRIOR TO TEST- Trulicity (dulaglutide) Ozempic, Wegovy (semaglutide) Mounjaro (tirzepatide) Bydureon Bcise (exanatide extended release)  DO NOT TAKE 1 DAY PRIOR TO YOUR TEST Rybelsus (semaglutide) Adlyxin (lixisenatide) Victoza (liraglutide) Byetta (exanatide) ___________________________________________________________________________  Your provider has requested that you go to the basement level for lab work before leaving today. Press "B" on the elevator. The lab is located at the first door on the left as you exit the elevator.  Reduce Advil/Aleve use.  Due to recent changes in healthcare laws, you may see the results of your imaging and laboratory studies on MyChart before your provider has had a chance to review them.  We understand that in some cases there may be results that are confusing or concerning to you. Not all laboratory results come back in the same time frame and the provider may be waiting for multiple results in order to interpret others.  Please give [place] 48 hours in order for your provider to thoroughly review all the results before contacting the office for clarification of your results.   Thank you for trusting me with your gastrointestinal care!   Alcide Evener, CRNP

## 2023-04-13 NOTE — Progress Notes (Signed)
04/13/2023 EPSIE WEMPE [id number] 1946-04-21   CHIEF COMPLAINT: Heartburn, schedule a colonoscopy  HISTORY OF PRESENT ILLNESS: Keiran Sandvik is a 77 year old female with a past medical history of anxiety, arthritis, upper lipidemia, hypertension, GERD and ulcerative colitis initially diagnosed in 2001 previously treated with Sulfasalazine.  She presents today to schedule a colonoscopy.  She typically passes a normal brown bowel movement most days, however, she has nonbloody urgent diarrhea x 3 episodes over the past 6 months, last episode occurred 1 week ago.  She has intermittent lower abdominal discomfort which occurs once weekly and last for few hours.  She takes Advil or Aleve 3 tabs twice daily for arthritis and left hip bursitis.  No black stools.  She stated having a lot of heartburn which is notably triggered by eating tomato products or other acidic foods or drinking wine.  She takes Tums as needed.  She describes having difficulty swallowing liquids or saliva which results in coughing, feels like fluids go down the wrong pipe.  No difficulty swallowing solid foods.  Her most recent colonoscopy was 12/30/2019 which showed pan ulcerative colitis/quiescent ulcerative colitis and one tubular adenomatous polyp was removed from the cecum.  She was advised to repeat a colonoscopy in 3 years.  She is no longer taking Sulfasalazine, it is unclear when she stopped taking this medication.  She underwent an EGD 09/25/2017 which showed an inlet patch to the proximal esophagus otherwise was normal.  No longer taking Mounjaro.      Latest Ref Rng & Units 03/13/2022    9:44 AM 06/24/2021    9:56 AM 10/12/2020    8:59 AM  CBC  WBC 4.0 - 10.5 K/uL 8.1  10.1  6.9   Hemoglobin 12.0 - 15.0 g/dL 65.7  84.6  96.2   Hematocrit 36.0 - 46.0 % 41.2  37.8  40.0   Platelets 150.0 - 400.0 K/uL 254.0  252.0  238.0        Latest Ref Rng & Units 11/25/2022   11:26 AM 03/13/2022    9:44 AM 06/24/2021    9:56  AM  CMP  Glucose 70 - 99 mg/dL 94  87  75   BUN 6 - 23 mg/dL 26  26  31    Creatinine 0.40 - 1.20 mg/dL 9.52  8.41  3.24   Sodium 135 - 145 mEq/L 140  141  140   Potassium 3.5 - 5.1 mEq/L 4.1  4.2  4.3   Chloride 96 - 112 mEq/L 102  105  104   CO2 19 - 32 mEq/L 30  29  29    Calcium 8.4 - 10.5 mg/dL 40.1  9.9  9.2   Total Protein 6.0 - 8.3 g/dL 7.3  7.0  6.5   Total Bilirubin 0.2 - 1.2 mg/dL 0.7  0.6  0.7   Alkaline Phos 39 - 117 U/L 46  40  35   AST 0 - 37 U/L 27  24  22    ALT 0 - 35 U/L 25  20  19      Colonoscopy 12/30/2019: - One less than 1 mm polyp in the cecum, removed with a cold biopsy forceps. Resected and retrieved.  - Pancolitis ulcerative colitis and quiescent ulcerative colitis. Inflammation was found in the rectum, in the recto-sigmoid colon, in the sigmoid colon, in the descending colon, in the splenic flexure, in the transverse colon, in the hepatic flexure, in the ascending colon and at the cecum. This was  mild in severity, quiescent compared to previous examinations. Biopsied.  - Diverticulosis in the sigmoid colon.  - Non-bleeding internal hemorrhoids.  - 3 year recall colonoscopy   1. Surgical [P], colon, ascending and cecum biopsies - COLONIC MUCOSA WITH NO SPECIFIC HISTOPATHOLOGIC CHANGES. SEE NOTE - SMALL INFLAMMATORY PSEUDOPOLYP - NEGATIVE FOR ACUTE INFLAMMATION, FEATURES OF CHRONICITY, GRANULOMAS OR DYSPLASIA 2. Surgical [P], transverse colon biopsies - COLONIC MUCOSA WITH NO SPECIFIC HISTOPATHOLOGIC CHANGES. SEE NOTE - NEGATIVE FOR ACUTE INFLAMMATION, FEATURES OF CHRONICITY, GRANULOMAS OR DYSPLASIA 3. Surgical [P], colon, sigmoid and descending biopsies - COLONIC MUCOSA WITH NO SPECIFIC HISTOPATHOLOGIC CHANGES. SEE NOTE - NEGATIVE FOR ACUTE INFLAMMATION, FEATURES OF CHRONICITY, GRANULOMAS OR DYSPLASIA 4. Surgical [P], colon, rectum biopsies - RECTAL MUCOSA WITH NO SPECIFIC HISTOPATHOLOGIC CHANGES. SEE NOTE - NEGATIVE FOR ACUTE INFLAMMATION, FEATURES OF  CHRONICITY, GRANULOMAS OR DYSPLASIA 5. Surgical [P], colon, cecum, polyp - TUBULAR ADENOMA - NEGATIVE FOR HIGH-GRADE DYSPLASIA OR MALIGNANCY  EGD 1/11/219: - Esophagogastric landmarks identified.  - Salmon-colored mucosa in the proximal esophagus consistent with inlet patch.  - Normal stomach.  - Normal examined duodenum.  - No specimens collected.  1. Surgical [P], ascending and cecum - INFLAMMATORY PSEUDOPOLYP. - OTHER FRAGMENTS OF COLONIC MUCOSA WITH NO SPECIFIC HISTOPATHOLOGIC CHANGES. SEE NOTE. - NEGATIVE FOR GRANULOMAS OR DYSPLASIA. 2. Surgical [P], transverse - COLONIC MUCOSA WITH NO SPECIFIC HISTOPATHOLOGIC CHANGES. - NEGATIVE FOR GRANULOMAS OR DYSPLASIA. 3. Surgical [P], descending - COLONIC MUCOSA WITH NO SPECIFIC HISTOPATHOLOGIC CHANGES. - NEGATIVE FOR GRANULOMAS OR DYSPLASIA. 4. Surgical [P], sigmoid - COLONIC MUCOSA WITH NO SPECIFIC HISTOPATHOLOGIC CHANGES. - NEGATIVE FOR GRANULOMAS OR DYSPLASIA. 5. Surgical [P], rectum - COLONIC MUCOSA WITH NO SPECIFIC HISTOPATHOLOGIC CHANGES. - NEGATIVE FOR GRANULOMAS OR DYSPLASIA  Past Medical History:  Diagnosis Date   Anxiety    no per pt   Arthritis    GERD (gastroesophageal reflux disease)    Heart murmur    'YEARS AGO"   History of smoking    History of ulcerative colitis 2005   pancolitis    Hyperlipidemia    Hypertension    Normal nuclear stress test 2008   myoview    Past Surgical History:  Procedure Laterality Date   BREAST BIOPSY Left 1986   biopsy x 2    COLONOSCOPY     KNEE SURGERY Left october   x 2   Social History: She is retired.  Reports that she quit smoking about 8 years ago. Her smoking use included cigarettes. She has never used smokeless tobacco. She reports that she does not drink alcohol and does not use drugs.  Family History: family history includes Angina in her mother; Breast cancer (age of onset: 70) in her mother; Hypertension in her father and mother; Pancreatic cancer in her  father.  No known family history of colorectal cancer or IBD.  No Known Allergies    Outpatient Encounter Medications as of 04/13/2023  Medication Sig   escitalopram (LEXAPRO) 10 MG tablet TAKE 1 TABLET EVERY DAY   ezetimibe (ZETIA) 10 MG tablet Take 1 tablet (10 mg total) by mouth daily.   gabapentin (NEURONTIN) 100 MG capsule Take 2 capsules (200 mg total) by mouth at bedtime.   lisinopril (ZESTRIL) 10 MG tablet TAKE 1 TABLET EVERY DAY   rosuvastatin (CRESTOR) 40 MG tablet Take 1 tablet (40 mg total) by mouth daily.   benzonatate (TESSALON) 100 MG capsule Take 1 capsule (100 mg total) by mouth 2 (two) times daily as needed for cough. (Patient not  taking: Reported on 04/13/2023)   diclofenac (VOLTAREN) 75 MG EC tablet  (Patient not taking: Reported on 04/13/2023)   tirzepatide [facility]) 2.5 MG/0.5ML Pen Inject 2.5 mg into the skin once a week. (Patient not taking: Reported on 04/13/2023)   No facility-administered encounter medications on file as of 04/13/2023.   REVIEW OF SYSTEMS:  Gen: Denies fever, sweats or chills. No weight loss.  CV: Denies chest pain, palpitations or edema. Resp: Denies cough, shortness of breath of hemoptysis.  GI: See HPI. GU: Denies urinary burning, blood in urine, increased urinary frequency or incontinence. MS: + Arthritis and left hip bursitis.  Derm: Denies rash, itchiness, skin lesions or unhealing ulcers. Psych: Denies depression, anxiety, memory loss or confusion. Heme: Denies bruising, easy bleeding. Neuro:  Denies headaches, dizziness or paresthesias. Endo:  Denies any problems with DM, thyroid or adrenal function.  PHYSICAL EXAM: BP 120/80 (BP Location: Left Arm, Patient Position: Sitting, Cuff Size: Normal)   Pulse 80   Ht 5\' 3"  (1.6 m)   Wt 154 lb 6 oz (70 kg)   SpO2 97%   BMI 27.35 kg/m  General: 77 year old female in no acute distress. Head: Normocephalic and atraumatic. Eyes:  Sclerae non-icteric, conjunctive pink. Ears: Normal auditory  acuity. Mouth: Dentition intact. No ulcers or lesions.  Neck: Supple, no lymphadenopathy or thyromegaly.  Lungs: Clear bilaterally to auscultation without wheezes, crackles or rhonchi. Heart: Regular rate and rhythm. No murmur, rub or gallop appreciated.  Abdomen: Soft, nontender, nondistended. No masses. No hepatosplenomegaly. Normoactive bowel sounds x 4 quadrants.  Rectal: Deferred.  Musculoskeletal: Symmetrical with no gross deformities. Skin: Warm and dry. No rash or lesions on visible extremities. Extremities: No edema. Neurological: Alert oriented x 4, no focal deficits.  Psychological:  Alert and cooperative. Normal mood and affect.  ASSESSMENT AND PLAN:  77 year old female with ulcerative colitis initially diagnosed in 2001, previously treated with Sulfasalazine which she is no longer taking.  Her most recent colonoscopy 12/30/2019 showed pan ulcerative colitis/quiescent ulcerative colitis.  She has had urgent nonbloody diarrhea x 3 episodes over the past 6 months, last episode occurred 1 week ago otherwise passes normal formed stools.  She has lower abdominal cramping once weekly. -Colonoscopy benefits and risks discussed including risk with sedation, risk of bleeding, perforation and infection  -CBC, CRP -Reduce NSAID use, follow-up with PCP for alternative arthritis/bursitis treatment -Further recommendations to be to be determined after colonoscopy completed  History of colon polyps.  One < 1mm tubular adenomatous polyp removed from the cecum per colonoscopy 12/2019 -Colonoscopy as ordered above  GERD with dysphagia with saliva and liquids. Chronic NSAID use.  EGD 09/2017 showed an inlet patch the proximal esophagus otherwise was normal. -Reduce NSAID use, follow-up with PCP for alternative arthritis/bursitis treatment -Famotidine 20 mg 1 p.o. daily -EGD at time of colonoscopy benefits and risks discussed including risk with sedation, risk of bleeding, perforation and infection   -GERD handout  -Further recommendation be determined after EGD and colonoscopy completed        CC:  Panosh, Neta Mends, MD

## 2023-04-16 DIAGNOSIS — M9903 Segmental and somatic dysfunction of lumbar region: Secondary | ICD-10-CM | POA: Diagnosis not present

## 2023-04-16 DIAGNOSIS — M25552 Pain in left hip: Secondary | ICD-10-CM | POA: Diagnosis not present

## 2023-04-16 DIAGNOSIS — M9901 Segmental and somatic dysfunction of cervical region: Secondary | ICD-10-CM | POA: Diagnosis not present

## 2023-04-16 DIAGNOSIS — M5451 Vertebrogenic low back pain: Secondary | ICD-10-CM | POA: Diagnosis not present

## 2023-04-20 ENCOUNTER — Telehealth: Payer: Self-pay

## 2023-04-20 NOTE — Telephone Encounter (Signed)
Contact pt & LVM to return call regarding labs.

## 2023-04-22 ENCOUNTER — Ambulatory Visit: Payer: Medicare HMO | Admitting: Family Medicine

## 2023-04-30 DIAGNOSIS — M5451 Vertebrogenic low back pain: Secondary | ICD-10-CM | POA: Diagnosis not present

## 2023-04-30 DIAGNOSIS — M25552 Pain in left hip: Secondary | ICD-10-CM | POA: Diagnosis not present

## 2023-04-30 DIAGNOSIS — M9901 Segmental and somatic dysfunction of cervical region: Secondary | ICD-10-CM | POA: Diagnosis not present

## 2023-04-30 DIAGNOSIS — M9903 Segmental and somatic dysfunction of lumbar region: Secondary | ICD-10-CM | POA: Diagnosis not present

## 2023-05-12 ENCOUNTER — Telehealth: Payer: Self-pay | Admitting: Internal Medicine

## 2023-05-12 NOTE — Telephone Encounter (Signed)
Pt call and stated she want dr. Fabian Sharp to give her a call back but didn't tell me what she wanted.

## 2023-05-13 NOTE — Telephone Encounter (Signed)
Spoke to Shelley Wright. Reports she had an abnormal mammogram last year. Was recommended to follow up with a [place]. Shelley Wright states lately feeling pain on L breast. Noticed it for a month. Shelley Wright wonder if radiology would need an order from Dr. Fabian Sharp before scheduling an appt and if insurance would cover it.  Advise Shelley Wright to be seen with provider for a breast examination and go from there. If there is any order need to be placed, Dr. Fabian Sharp can let her know.   Appt schedule for 05/19/2023.

## 2023-05-14 DIAGNOSIS — M9901 Segmental and somatic dysfunction of cervical region: Secondary | ICD-10-CM | POA: Diagnosis not present

## 2023-05-14 DIAGNOSIS — M5451 Vertebrogenic low back pain: Secondary | ICD-10-CM | POA: Diagnosis not present

## 2023-05-14 DIAGNOSIS — M25552 Pain in left hip: Secondary | ICD-10-CM | POA: Diagnosis not present

## 2023-05-14 DIAGNOSIS — M9903 Segmental and somatic dysfunction of lumbar region: Secondary | ICD-10-CM | POA: Diagnosis not present

## 2023-05-19 ENCOUNTER — Encounter: Payer: Self-pay | Admitting: Internal Medicine

## 2023-05-19 ENCOUNTER — Ambulatory Visit (INDEPENDENT_AMBULATORY_CARE_PROVIDER_SITE_OTHER): Payer: Medicare HMO | Admitting: Internal Medicine

## 2023-05-19 VITALS — BP 128/70 | HR 60 | Temp 98.1°F | Ht 63.0 in | Wt 154.6 lb

## 2023-05-19 DIAGNOSIS — E785 Hyperlipidemia, unspecified: Secondary | ICD-10-CM | POA: Diagnosis not present

## 2023-05-19 DIAGNOSIS — I10 Essential (primary) hypertension: Secondary | ICD-10-CM | POA: Diagnosis not present

## 2023-05-19 DIAGNOSIS — N644 Mastodynia: Secondary | ICD-10-CM | POA: Diagnosis not present

## 2023-05-19 DIAGNOSIS — Z9189 Other specified personal risk factors, not elsewhere classified: Secondary | ICD-10-CM

## 2023-05-19 DIAGNOSIS — Z79899 Other long term (current) drug therapy: Secondary | ICD-10-CM | POA: Diagnosis not present

## 2023-05-19 DIAGNOSIS — E669 Obesity, unspecified: Secondary | ICD-10-CM | POA: Diagnosis not present

## 2023-05-19 DIAGNOSIS — R7303 Prediabetes: Secondary | ICD-10-CM | POA: Diagnosis not present

## 2023-05-19 DIAGNOSIS — Z8249 Family history of ischemic heart disease and other diseases of the circulatory system: Secondary | ICD-10-CM | POA: Diagnosis not present

## 2023-05-19 MED ORDER — ZEPBOUND 2.5 MG/0.5ML ~~LOC~~ SOAJ: 2.5000 mg | SUBCUTANEOUS | 2 refills | Status: DC

## 2023-05-19 NOTE — Patient Instructions (Addendum)
I will  send order diagnostic  mammo for the left breast since you have new sx since  you last mammogram.  Will send in zepbound  instead of ozempic  for weight management and comorbid conditions . Uncertain if would be covered.   Indications keep changing over time for  increased coverage.

## 2023-05-19 NOTE — Progress Notes (Signed)
Chief Complaint  Patient presents with   Breast Pain    Pt c/o left breast pain. Going on for a month now. Abnormal mammo on 06/2022. Want to know if need an order to [facility] and if insurance would cover it.    Medical Management of Chronic Issues    Would like to discuss an alternative to mounjaro.     HPI: Shelley Wright 77 y.o. come in for concern about  tenderness  left breast  for months   after last mammo (dx and [place] felt to be benign )and are  no mass felt ., this is in area of br bx cyst year ago   Needs referral order for dx mammo and [place] instead of routine screen   This tenderness occurred after last mammo.    Weight management  :    had lost 20 #  om ozempic and felt wekll except did have some vomiting  with med   but when went off causee no longer covered and was in [place]    the weight  came back on   : hard to stay active  hard to get rid of  pain buttocks pain .   3 mos for 20 # and in December would not  cover.   Did try mounjaro  since at the time not covered   She is r for ht and HLD and family hx    so risk for vascular disease with ldl over 200    ROS: See pertinent positives and negatives per HPI.  Past Medical History:  Diagnosis Date   Anxiety    no per pt   Arthritis    GERD (gastroesophageal reflux disease)    Heart murmur    'YEARS AGO"   History of smoking    History of ulcerative colitis 2005   pancolitis    Hyperlipidemia    Hypertension    Normal nuclear stress test 2008   myoview     Family History  Problem Relation Age of Onset   Pancreatic cancer Father        deceased   Hypertension Father    Breast cancer Mother 60   Angina Mother    Hypertension Mother    Colon cancer Neg Hx    Colon polyps Neg Hx    Rectal cancer Neg Hx    Stomach cancer Neg Hx     Social History   Socioeconomic History   Marital status: Married    Spouse name: Not on file   Number of children: 4   Years of education: Not on file   Highest  education level: Not on file  Occupational History   Not on file  Tobacco Use   Smoking status: Former    Current packs/day: 0.00    Types: Cigarettes    Quit date: 07/04/2014    Years since quitting: 8.8   Smokeless tobacco: Never  Vaping Use   Vaping status: Never Used  Substance and Sexual Activity   Alcohol use: No    Alcohol/week: 0.0 standard drinks of alcohol   Drug use: No   Sexual activity: Not on file  Other Topics Concern   Not on file  Social History Narrative   Hh  Of 2    Pet dog    Off tobacco.   [employer].     Teacher runs school.   8 - 3    G4 P4    Husband had  cva and blocked carotids    Left handed   Drinks caffeine   Two story home   Social Determinants of Health   Financial Resource Strain: Low Risk  (09/30/2022)   Overall Financial Resource Strain (CARDIA)    Difficulty of Paying Living Expenses: Not hard at all  Food Insecurity: No Food Insecurity (09/30/2022)   Hunger Vital Sign    Worried About Running Out of Food in the Last Year: Never true    Ran Out of Food in the Last Year: Never true  Transportation Needs: No Transportation Needs (09/30/2022)   PRAPARE - Administrator, Civil Service (Medical): No    Lack of Transportation (Non-Medical): No  Physical Activity: Inactive (09/30/2022)   Exercise Vital Sign    Days of Exercise per Week: 0 days    Minutes of Exercise per Session: 0 min  Stress: No Stress Concern Present (09/30/2022)   [employer] of Occupational Health - Occupational Stress Questionnaire    Feeling of Stress : Not at all  Social Connections: Socially Integrated (09/30/2022)   Social Connection and Isolation Panel [NHANES]    Frequency of Communication with Friends and Family: More than three times a week    Frequency of Social Gatherings with Friends and Family: More than three times a week    Attends Religious Services: More than 4 times per year    Active Member of [employer] or Organizations: Yes     Attends Engineer, structural: More than 4 times per year    Marital Status: Married    Outpatient Medications Prior to Visit  Medication Sig Dispense Refill   escitalopram (LEXAPRO) 10 MG tablet TAKE 1 TABLET EVERY DAY 90 tablet 3   ezetimibe (ZETIA) 10 MG tablet Take 1 tablet (10 mg total) by mouth daily. 90 tablet 3   famotidine (PEPCID) 20 MG tablet Take 1 tablet (20 mg total) by mouth daily. 30 tablet 2   gabapentin (NEURONTIN) 100 MG capsule Take 2 capsules (200 mg total) by mouth at bedtime. 180 capsule 0   lisinopril (ZESTRIL) 10 MG tablet TAKE 1 TABLET EVERY DAY 90 tablet 3   rosuvastatin (CRESTOR) 40 MG tablet Take 1 tablet (40 mg total) by mouth daily. 90 tablet 3   benzonatate (TESSALON) 100 MG capsule Take 1 capsule (100 mg total) by mouth 2 (two) times daily as needed for cough. (Patient not taking: Reported on 04/13/2023) 20 capsule 0   diclofenac (VOLTAREN) 75 MG EC tablet  (Patient not taking: Reported on 04/13/2023)     tirzepatide [facility]) 2.5 MG/0.5ML Pen Inject 2.5 mg into the skin once a week. (Patient not taking: Reported on 04/13/2023) 2 mL 1   No facility-administered medications prior to visit.     EXAM:  BP 128/70 (BP Location: Left Arm, Patient Position: Sitting, Cuff Size: Normal)   Pulse 60   Temp 98.1 F (36.7 C) (Oral)   Ht 5\' 3"  (1.6 m)   Wt 154 lb 9.6 oz (70.1 kg)   SpO2 97%   BMI 27.39 kg/m   Body mass index is 27.39 kg/m.  GENERAL: vitals reviewed and listed above, alert, oriented, appears well hydrated and in no acute distress HEENT: atraumatic, conjunctiva  clear, no obvious abnormalities on inspection of external nose and ears OP : no lesion edema or exudate  NECK: no obvious masses on inspection palpation  LUNGS: clear to auscultation bilaterally, no wheezes, rales or rhonchi, good air movement CV: HRRR, no clubbing cyanosis or  peripheral edema nl cap refill  MS: moves all extremities without noticeable focal  abnormality PSYCH:  pleasant and cooperative, no obvious depression or anxiety Lab Results  Component Value Date   WBC 10.9 (H) 04/13/2023   HGB 12.7 04/13/2023   HCT 39.1 04/13/2023   PLT 238.0 04/13/2023   GLUCOSE 94 11/25/2022   CHOL 156 11/25/2022   TRIG 88.0 11/25/2022   HDL 69.00 11/25/2022   LDLDIRECT 138.5 10/17/2013   LDLCALC 69 11/25/2022   ALT 25 11/25/2022   AST 27 11/25/2022   NA 140 11/25/2022   K 4.1 11/25/2022   CL 102 11/25/2022   CREATININE 0.91 11/25/2022   BUN 26 (H) 11/25/2022   CO2 30 11/25/2022   TSH 2.11 03/13/2022   HGBA1C 5.5 11/25/2022   BP Readings from Last 3 Encounters:  05/19/23 128/70  04/13/23 120/80  02/25/23 120/82    ASSESSMENT AND PLAN:  Discussed the following assessment and plan:  Pain of left breast  Medication management  Pre-diabetes - Plan: tirzepatide (ZEPBOUND) 2.5 MG/0.5ML Pen  Hyperlipidemia, unspecified hyperlipidemia type - Plan: tirzepatide (ZEPBOUND) 2.5 MG/0.5ML Pen  Class 1 obesity with serious comorbidity in adult, unspecified BMI, unspecified obesity type - Plan: tirzepatide (ZEPBOUND) 2.5 MG/0.5ML Pen  At increased risk for cardiovascular disease - Plan: tirzepatide (ZEPBOUND) 2.5 MG/0.5ML Pen  HYPERTENSION - controlled - Plan: tirzepatide (ZEPBOUND) 2.5 MG/0.5ML Pen  Family history of cardiovascular disease I agree she is a candidate for glp1 with cv risk and pre diabetes  dysmetabolism issues  (but  some concern about semiglutiede with hx of vomiting  ) [address]  not covered?  Will try zep cound for  weight management and dysmetabolism  but prob with be denied.  Would not want her to continue if  regular vomiting occurrs Back buttock pain has been limiting for her activity -Patient advised to return or notify health care team  if  new concerns arise.  Patient Instructions  I will  send order diagnostic  mammo for the left breast since you have new sx since  you last mammogram.  Will send in zepbound  instead of ozempic   for weight management and comorbid conditions . Uncertain if would be covered.   Indications keep changing over time for  increased coverage.        Neta Mends. Jessenia Filippone M.D.

## 2023-05-20 NOTE — Progress Notes (Signed)
[facility] [address] [place] [address] Phone: [phone] Subjective:   [name], am serving as a scribe for Dr. Antoine Primas.  I'm seeing this patient by the request  of:  Panosh, Neta Mends, MD  CC: Left hip and left knee pain back pain follow-up  HQI:[id number]  02/25/2023 Concern more for a radicular symptoms.  From the lumbar spine.  Started on a low-dose of gabapentin.  Warned of potential side effects but should help out significantly.  Will continue to work on core strengthening and hip abductor strengthening.  Follow-up again in 8 weeks.  Worsening pain do need to consider MRI of the lumbar and hip.  X-rays of the lumbar spine ordered today as well with patient's past medical history of ulcerative colitis.     Do feel that this is playing a role and potentially putting more pressure on her back as well as her hip.  Still declines to do a brace but would highly recommend and OA stability brace in the long run.  Patient wants to see how she does otherwise.  Follow-up with me again in 8 weeks      Update 05/21/2023 Shelley Wright is a 77 y.o. female coming in with complaint of L hip and L knee pain. Patient states that she is doing better since last visit. Gabapentin is helpful. Has been seeing chiro and acupunture. Would like MRI for her back.   When on unsteady surfaces she continues to have knee instability.        Past Medical History:  Diagnosis Date   Anxiety    no per pt   Arthritis    GERD (gastroesophageal reflux disease)    Heart murmur    'YEARS AGO"   History of smoking    History of ulcerative colitis 2005   pancolitis    Hyperlipidemia    Hypertension    Normal nuclear stress test 2008   myoview    Past Surgical History:  Procedure Laterality Date   BREAST BIOPSY Left 1986   biopsy x 2    COLONOSCOPY     KNEE SURGERY Left october   x 2   Social History   Socioeconomic History   Marital status:  Married    Spouse name: Not on file   Number of children: 4   Years of education: Not on file   Highest education level: Not on file  Occupational History   Not on file  Tobacco Use   Smoking status: Former    Current packs/day: 0.00    Types: Cigarettes    Quit date: 07/04/2014    Years since quitting: 8.8   Smokeless tobacco: Never  Vaping Use   Vaping status: Never Used  Substance and Sexual Activity   Alcohol use: No    Alcohol/week: 0.0 standard drinks of alcohol   Drug use: No   Sexual activity: Not on file  Other Topics Concern   Not on file  Social History Narrative   Hh  Of 2    Pet dog    Off tobacco.   [employer].     Teacher runs school.   8 - 3    G4 P4    Husband had cva and blocked carotids    Left handed   Drinks caffeine   Two story home   Social Determinants of Health   Financial Resource Strain: Low Risk  (09/30/2022)   Overall Physicist, medical Strain (  CARDIA)    Difficulty of Paying Living Expenses: Not hard at all  Food Insecurity: No Food Insecurity (09/30/2022)   Hunger Vital Sign    Worried About Running Out of Food in the Last Year: Never true    Ran Out of Food in the Last Year: Never true  Transportation Needs: No Transportation Needs (09/30/2022)   PRAPARE - Administrator, Civil Service (Medical): No    Lack of Transportation (Non-Medical): No  Physical Activity: Inactive (09/30/2022)   Exercise Vital Sign    Days of Exercise per Week: 0 days    Minutes of Exercise per Session: 0 min  Stress: No Stress Concern Present (09/30/2022)   [employer] of Occupational Health - Occupational Stress Questionnaire    Feeling of Stress : Not at all  Social Connections: Socially Integrated (09/30/2022)   Social Connection and Isolation Panel [NHANES]    Frequency of Communication with Friends and Family: More than three times a week    Frequency of Social Gatherings with Friends and Family: More than three times a week     Attends Religious Services: More than 4 times per year    Active Member of [employer] or Organizations: Yes    Attends Engineer, structural: More than 4 times per year    Marital Status: Married   No Known Allergies Family History  Problem Relation Age of Onset   Pancreatic cancer Father        deceased   Hypertension Father    Breast cancer Mother 38   Angina Mother    Hypertension Mother    Colon cancer Neg Hx    Colon polyps Neg Hx    Rectal cancer Neg Hx    Stomach cancer Neg Hx      Current Outpatient Medications (Cardiovascular):    ezetimibe (ZETIA) 10 MG tablet, Take 1 tablet (10 mg total) by mouth daily.   lisinopril (ZESTRIL) 10 MG tablet, TAKE 1 TABLET EVERY DAY   rosuvastatin (CRESTOR) 40 MG tablet, Take 1 tablet (40 mg total) by mouth daily.     Current Outpatient Medications (Other):    escitalopram (LEXAPRO) 10 MG tablet, TAKE 1 TABLET EVERY DAY   famotidine (PEPCID) 20 MG tablet, Take 1 tablet (20 mg total) by mouth daily.   gabapentin (NEURONTIN) 100 MG capsule, Take 2 capsules (200 mg total) by mouth at bedtime.   tirzepatide (ZEPBOUND) 2.5 MG/0.5ML Pen, Inject 2.5 mg into the skin once a week.   Reviewed prior external information including notes and imaging from  primary care provider As well as notes that were available from care everywhere and other healthcare systems.  Past medical history, social, surgical and family history all reviewed in electronic medical record.  No pertanent information unless stated regarding to the chief complaint.   Review of Systems:  No headache, visual changes, nausea, vomiting, diarrhea, constipation, dizziness, abdominal pain, skin rash, fevers, chills, night sweats, weight loss, swollen lymph nodes, body aches, joint swelling, chest pain, shortness of breath, mood changes. POSITIVE muscle aches  Objective  Blood pressure 110/82, pulse 79, height 5\' 3"  (1.6 m), SpO2 97%.   General: No apparent distress alert  and oriented x3 mood and affect normal, dressed appropriately.  HEENT: Pupils equal, extraocular movements intact  Respiratory: Patient's speak in full sentences and does not appear short of breath  Cardiovascular: No lower extremity edema, non tender, no erythema  Antalgic gait noted.  Positive straight leg test noted.  Severe tenderness  to palpation in the paraspinal musculature.  Patient does have some weakness with 4 out of 5 strength of hip flexion and knee extension compared to the contralateral side.    Impression and Recommendations:    The above documentation has been reviewed and is accurate and complete Judi Saa, DO

## 2023-05-21 DIAGNOSIS — Z803 Family history of malignant neoplasm of breast: Secondary | ICD-10-CM | POA: Diagnosis not present

## 2023-05-21 DIAGNOSIS — N644 Mastodynia: Secondary | ICD-10-CM | POA: Diagnosis not present

## 2023-05-21 LAB — HM MAMMOGRAPHY

## 2023-05-22 ENCOUNTER — Encounter: Payer: Self-pay | Admitting: Family Medicine

## 2023-05-22 ENCOUNTER — Ambulatory Visit: Payer: Medicare HMO | Admitting: Family Medicine

## 2023-05-22 ENCOUNTER — Encounter: Payer: Self-pay | Admitting: Internal Medicine

## 2023-05-22 VITALS — BP 110/82 | HR 79 | Ht 63.0 in

## 2023-05-22 DIAGNOSIS — M5416 Radiculopathy, lumbar region: Secondary | ICD-10-CM | POA: Insufficient documentation

## 2023-05-22 DIAGNOSIS — M545 Low back pain, unspecified: Secondary | ICD-10-CM

## 2023-05-22 NOTE — Assessment & Plan Note (Signed)
Left-sided lumbar radiculopathy.  Discussed with patient's about icing regimen and home exercises.  Discussed which activities to do and which ones to avoid.  Patient has failed all other conservative therapy including chiropractic care, formal physical therapy, multiple medications including gabapentin and muscle relaxers.  Patient's x-rays did show moderate to severe arthritic changes at the L2-L3 and L3-L4 that could be representing some of the radicular symptoms patient is having.  Encourage patient to get an MRI at this point.  Depending on findings we will see if patient is a candidate for injections.  Patient is in agreement with the plan.  Follow-up after imaging to discuss further treatment options.

## 2023-05-22 NOTE — Patient Instructions (Signed)
MRI lumbar [phone] °We will be in touch °

## 2023-05-27 ENCOUNTER — Other Ambulatory Visit (HOSPITAL_BASED_OUTPATIENT_CLINIC_OR_DEPARTMENT_OTHER): Payer: Self-pay | Admitting: Cardiology

## 2023-05-27 DIAGNOSIS — I2510 Atherosclerotic heart disease of native coronary artery without angina pectoris: Secondary | ICD-10-CM

## 2023-05-27 NOTE — Telephone Encounter (Signed)
Rx request sent to pharmacy.  

## 2023-05-29 ENCOUNTER — Ambulatory Visit
Admission: RE | Admit: 2023-05-29 | Discharge: 2023-05-29 | Disposition: A | Discharge status: Home / Self Care | Payer: Medicare HMO | Source: Ambulatory Visit | Attending: Family Medicine | Admitting: Family Medicine

## 2023-05-29 DIAGNOSIS — M545 Low back pain, unspecified: Secondary | ICD-10-CM

## 2023-05-29 DIAGNOSIS — M5416 Radiculopathy, lumbar region: Secondary | ICD-10-CM | POA: Diagnosis not present

## 2023-06-01 ENCOUNTER — Telehealth: Payer: Self-pay

## 2023-06-01 ENCOUNTER — Other Ambulatory Visit: Payer: Self-pay

## 2023-06-01 MED ORDER — GABAPENTIN 100 MG PO CAPS: 200.0000 mg | ORAL_CAPSULE | Freq: Every day | ORAL | 0 refills | Status: DC

## 2023-06-01 NOTE — Telephone Encounter (Signed)
Patients pharmacy called stating they sent a fax over for a refill of the gabapentin 100mg . Can patient get that refilled to [facility] please

## 2023-06-01 NOTE — Telephone Encounter (Signed)
Rx filled

## 2023-06-04 ENCOUNTER — Other Ambulatory Visit: Payer: Self-pay | Admitting: Family Medicine

## 2023-06-08 ENCOUNTER — Encounter: Payer: Self-pay | Admitting: Gastroenterology

## 2023-06-08 ENCOUNTER — Ambulatory Visit: Payer: Medicare HMO | Admitting: Gastroenterology

## 2023-06-08 VITALS — BP 143/68 | HR 77 | Temp 97.8°F | Resp 20 | Ht 63.0 in | Wt 154.0 lb

## 2023-06-08 DIAGNOSIS — K5100 Ulcerative (chronic) pancolitis without complications: Secondary | ICD-10-CM

## 2023-06-08 DIAGNOSIS — K2100 Gastro-esophageal reflux disease with esophagitis, without bleeding: Secondary | ICD-10-CM

## 2023-06-08 DIAGNOSIS — D120 Benign neoplasm of cecum: Secondary | ICD-10-CM | POA: Diagnosis not present

## 2023-06-08 DIAGNOSIS — K219 Gastro-esophageal reflux disease without esophagitis: Secondary | ICD-10-CM | POA: Diagnosis not present

## 2023-06-08 DIAGNOSIS — R1310 Dysphagia, unspecified: Secondary | ICD-10-CM

## 2023-06-08 DIAGNOSIS — K3189 Other diseases of stomach and duodenum: Secondary | ICD-10-CM | POA: Diagnosis not present

## 2023-06-08 DIAGNOSIS — K229 Disease of esophagus, unspecified: Secondary | ICD-10-CM

## 2023-06-08 DIAGNOSIS — K51919 Ulcerative colitis, unspecified with unspecified complications: Secondary | ICD-10-CM

## 2023-06-08 MED ORDER — PANTOPRAZOLE SODIUM 40 MG PO TBEC: 40.0000 mg | DELAYED_RELEASE_TABLET | Freq: Two times a day (BID) | ORAL | 5 refills | Status: DC

## 2023-06-08 MED ORDER — SODIUM CHLORIDE 0.9 % IV SOLN: 500.0000 mL | Freq: Once | INTRAVENOUS | Status: DC

## 2023-06-08 NOTE — Patient Instructions (Signed)
Handouts Provided:  GERD, Diverticulosis and Polyps  START taking Protonix 40 mg twice daily for 2 months then decrease to once daily.  Medication has been sent to [facility] pharmacy per patient request.  YOU HAD AN ENDOSCOPIC PROCEDURE TODAY AT THE [facility]:   Refer to the procedure report that was given to you for any specific questions about what was found during the examination.  If the procedure report does not answer your questions, please call your gastroenterologist to clarify.  If you requested that your care partner not be given the details of your procedure findings, then the procedure report has been included in a sealed envelope for you to review at your convenience later.  YOU SHOULD EXPECT: Some feelings of bloating in the abdomen. Passage of more gas than usual.  Walking can help get rid of the air that was put into your GI tract during the procedure and reduce the bloating. If you had a lower endoscopy (such as a colonoscopy or flexible sigmoidoscopy) you may notice spotting of blood in your stool or on the toilet paper. If you underwent a bowel prep for your procedure, you may not have a normal bowel movement for a few days.  Please Note:  You might notice some irritation and congestion in your nose or some drainage.  This is from the oxygen used during your procedure.  There is no need for concern and it should clear up in a day or so.  SYMPTOMS TO REPORT IMMEDIATELY:  Following lower endoscopy (colonoscopy or flexible sigmoidoscopy):  Excessive amounts of blood in the stool  Significant tenderness or worsening of abdominal pains  Swelling of the abdomen that is new, acute  Fever of 100F or higher  Following upper endoscopy (EGD)  Vomiting of blood or coffee ground material  New chest pain or pain under the shoulder blades  Painful or persistently difficult swallowing  New shortness of breath  Fever of 100F or higher  Black, tarry-looking stools  For  urgent or emergent issues, a gastroenterologist can be reached at any hour by calling [phone]. Do not use MyChart messaging for urgent concerns.    DIET:  We do recommend a small meal at first, but then you may proceed to your regular diet.  Drink plenty of fluids but you should avoid alcoholic beverages for 24 hours.  ACTIVITY:  You should plan to take it easy for the rest of today and you should NOT DRIVE or use heavy machinery until tomorrow (because of the sedation medicines used during the test).    FOLLOW UP: Our staff will call the number listed on your records the next business day following your procedure.  We will call around 7:15- 8:00 am to check on you and address any questions or concerns that you may have regarding the information given to you following your procedure. If we do not reach you, we will leave a message.     If any biopsies were taken you will be contacted by phone or by letter within the next 1-3 weeks.  Please call us at [phone] if you have not heard about the biopsies in 3 weeks.    SIGNATURES/CONFIDENTIALITY: You and/or your care partner have signed paperwork which will be entered into your electronic medical record.  These signatures attest to the fact that that the information above on your After Visit Summary has been reviewed and is understood.  Full responsibility of the confidentiality of this discharge information lies with  you and/or your care-partner.

## 2023-06-08 NOTE — Op Note (Signed)
Shelley Wright Procedure Date: 06/08/2023 2:28 PM MRN: [id number] Endoscopist: Napoleon Form , MD, [id number] Age: 77 Referring MD:  Date of Birth: 08-14-1946 Gender: Female Account #: [id number] Procedure:                Colonoscopy Indications:              High risk colon cancer surveillance: Ulcerative                            pancolitis of 8 (or more) years duration Medicines:                Monitored Anesthesia Care Procedure:                Pre-Anesthesia Assessment:                           - Prior to the procedure, a History and Physical                            was performed, and patient medications and                            allergies were reviewed. The patient's tolerance of                            previous anesthesia was also reviewed. The risks                            and benefits of the procedure and the sedation                            options and risks were discussed with the patient.                            All questions were answered, and informed consent                            was obtained. Prior Anticoagulants: The patient has                            taken no anticoagulant or antiplatelet agents. ASA                            Grade Assessment: II - A patient with mild systemic                            disease. After reviewing the risks and benefits,                            the patient was deemed in satisfactory condition to                            undergo the procedure.  After obtaining informed consent, the colonoscope                            was passed under direct vision. Throughout the                            procedure, the patient's blood pressure, pulse, and                            oxygen saturations were monitored continuously. The                            Olympus [id number]-[id number] (#[id number]) Colonoscope was                            introduced through  the anus and advanced to the the                            cecum, identified by appendiceal orifice and                            ileocecal valve. The colonoscopy was performed                            without difficulty. The patient tolerated the                            procedure well. The quality of the bowel                            preparation was good. The ileocecal valve,                            appendiceal orifice, and rectum were photographed. Scope In: 2:55:09 PM Scope Out: 3:14:01 PM Scope Withdrawal Time: 0 hours 9 minutes 42 seconds  Total Procedure Duration: 0 hours 18 minutes 52 seconds  Findings:                 The perianal and digital rectal examinations were                            normal.                           A 7 mm polyp was found in the ileocecal valve. The                            polyp was sessile. The polyp was removed with a                            cold snare. Resection and retrieval were complete.                           Inflammation was found in a continuous and  circumferential pattern from the rectum to the                            cecum. This was graded as Mayo Score 1 (mild, with                            erythema, decreased vascular pattern, mild                            friability), and when compared to the previous                            examination, the findings are quiescent. Biopsies                            were taken with a cold forceps for histology.                           Scattered small-mouthed diverticula were found in                            the sigmoid colon, descending colon, transverse                            colon and ascending colon.                           Non-bleeding external and internal hemorrhoids were                            found during retroflexion. The hemorrhoids were                            medium-sized. Complications:            No immediate  complications. Estimated Blood Loss:     Estimated blood loss was minimal. Impression:               - One 7 mm polyp at the ileocecal valve, removed                            with a cold snare. Resected and retrieved.                           - Mild (Mayo Score 1) pancolitis ulcerative                            colitis, quiescent since the last examination.                            Biopsied.                           - Diverticulosis in the sigmoid colon, in the  descending colon, in the transverse colon and in                            the ascending colon.                           - Non-bleeding external and internal hemorrhoids. Recommendation:           - Patient has a contact number available for                            emergencies. The signs and symptoms of potential                            delayed complications were discussed with the                            patient. Return to normal activities tomorrow.                            Written discharge instructions were provided to the                            patient.                           - Resume previous diet.                           - Continue present medications.                           - Await pathology results.                           - Repeat colonoscopy in 3 years for surveillance                            based on pathology results. Napoleon Form, MD 06/08/2023 3:24:54 PM This report has been signed electronically.

## 2023-06-08 NOTE — Progress Notes (Signed)
Called to room to assist during endoscopic procedure.  Patient ID and intended procedure confirmed with present staff. Received instructions for my participation in the procedure from the performing physician.  

## 2023-06-08 NOTE — Progress Notes (Unsigned)
Cell phone off per pt Pt states she never took Zepbound

## 2023-06-08 NOTE — Op Note (Signed)
[facility] Patient Name: Shelley Wright Procedure Date: 06/08/2023 2:28 PM MRN: [id number] Endoscopist: Napoleon Form , MD, [id number] Age: 76 Referring MD:  Date of Birth: Jan 21, 1946 Gender: Female Account #: [id number] Procedure:                Upper GI endoscopy Indications:              Dysphagia, Esophageal reflux symptoms that persist                            despite appropriate therapy Medicines:                Monitored Anesthesia Care Procedure:                Pre-Anesthesia Assessment:                           - Prior to the procedure, a History and Physical                            was performed, and patient medications and                            allergies were reviewed. The patient's tolerance of                            previous anesthesia was also reviewed. The risks                            and benefits of the procedure and the sedation                            options and risks were discussed with the patient.                            All questions were answered, and informed consent                            was obtained. Prior Anticoagulants: The patient has                            taken no anticoagulant or antiplatelet agents. ASA                            Grade Assessment: II - A patient with mild systemic                            disease. After reviewing the risks and benefits,                            the patient was deemed in satisfactory condition to                            undergo the procedure.  After obtaining informed consent, the endoscope was                            passed under direct vision. Throughout the                            procedure, the patient's blood pressure, pulse, and                            oxygen saturations were monitored continuously. The                            GIF [id number] was introduced through the                            mouth, and advanced to  the second part of duodenum.                            The upper GI endoscopy was accomplished without                            difficulty. The patient tolerated the procedure                            well. Scope In: Scope Out: Findings:                 LA Grade C (one or more mucosal breaks continuous                            between tops of 2 or more mucosal folds, less than                            75% circumference) esophagitis with nodular mucosa,                            no bleeding was found 36 to 37 cm from the                            incisors. Biopsies were taken with a cold forceps                            for histology.                           No endoscopic abnormality was evident in the                            esophagus to explain the patient's complaint of                            dysphagia. It was decided, however, to proceed with  dilation of the entire esophagus. The scope was                            withdrawn. Dilation was performed with a Maloney                            dilator with no resistance at 54 Fr. The dilation                            site was examined following endoscope reinsertion                            and showed no change.                           Inlet patch in proximal esophagus, unchanged                            compared to prior exam                           The stomach was normal.                           The cardia and gastric fundus were normal on                            retroflexion.                           The examined duodenum was normal. Complications:            No immediate complications. Estimated Blood Loss:     Estimated blood loss was minimal. Impression:               - LA Grade C erosive esophagitis with no bleeding.                            Biopsied.                           - No endoscopic esophageal abnormality to explain                            patient's  dysphagia. Esophagus dilated. Dilated.                           - Normal stomach.                           - Normal examined duodenum. Recommendation:           - Resume previous diet.                           - Continue present medications.                           -  Await pathology results.                           - Follow an antireflux regimen.                           - Use Protonix (pantoprazole) 40 mg PO BID X 2                            months and then decrease to once daily. Rx for 30                            days with 5 refills Napoleon Form, MD 06/08/2023 3:21:09 PM This report has been signed electronically.

## 2023-06-08 NOTE — Progress Notes (Unsigned)
[facility] History and Physical   Primary Care Physician:  Madelin Headings, MD   Reason for Procedure:  GERD, dysphagia and ulcerative colitis  Plan:    EGD and colonoscopy with possible interventions as needed     HPI: Shelley Wright is a very pleasant 77 y.o. female here for surveillance colonoscopy for UC and EGD for evaluation of GERD and dysphagia.   The risks and benefits as well as alternatives of endoscopic procedure(s) have been discussed and reviewed. All questions answered. The patient agrees to proceed.    Past Medical History:  Diagnosis Date   Anxiety    no per pt   Arthritis    Cataract    GERD (gastroesophageal reflux disease)    Heart murmur    'YEARS AGO"   History of smoking    History of ulcerative colitis 2005   pancolitis    Hyperlipidemia    Hypertension    Normal nuclear stress test 2008   myoview    Ulcerative colitis [facility])     Past Surgical History:  Procedure Laterality Date   BREAST BIOPSY Left 1986   biopsy x 2    COLONOSCOPY     KNEE SURGERY Left october   x 2   UPPER GASTROINTESTINAL ENDOSCOPY      Prior to Admission medications   Medication Sig Start Date End Date Taking? Authorizing Provider  escitalopram (LEXAPRO) 10 MG tablet TAKE 1 TABLET EVERY DAY 12/29/22  Yes Worthy Rancher B, FNP  ezetimibe (ZETIA) 10 MG tablet Take 1 tablet (10 mg total) by mouth daily. Please schedule appointment for refills. 1st attempt 05/27/23  Yes Jodelle Red, MD  famotidine (PEPCID) 20 MG tablet Take 1 tablet (20 mg total) by mouth daily. 04/13/23  Yes Arnaldo Natal, NP  gabapentin (NEURONTIN) 100 MG capsule Take 2 capsules (200 mg total) by mouth at bedtime. 06/01/23  Yes Antoine Primas M, DO  lisinopril (ZESTRIL) 10 MG tablet TAKE 1 TABLET EVERY DAY 09/16/22  Yes Panosh, Neta Mends, MD  rosuvastatin (CRESTOR) 40 MG tablet Take 1 tablet (40 mg total) by mouth daily. 06/16/22  Yes Jodelle Red, MD  tirzepatide  [facility]) 2.5 MG/0.5ML Pen Inject 2.5 mg into the skin once a week. Patient not taking: Reported on 06/08/2023 05/19/23   Madelin Headings, MD    Current Outpatient Medications  Medication Sig Dispense Refill   escitalopram (LEXAPRO) 10 MG tablet TAKE 1 TABLET EVERY DAY 90 tablet 3   ezetimibe (ZETIA) 10 MG tablet Take 1 tablet (10 mg total) by mouth daily. Please schedule appointment for refills. 1st attempt 90 tablet 0   famotidine (PEPCID) 20 MG tablet Take 1 tablet (20 mg total) by mouth daily. 30 tablet 2   gabapentin (NEURONTIN) 100 MG capsule Take 2 capsules (200 mg total) by mouth at bedtime. 180 capsule 0   lisinopril (ZESTRIL) 10 MG tablet TAKE 1 TABLET EVERY DAY 90 tablet 3   rosuvastatin (CRESTOR) 40 MG tablet Take 1 tablet (40 mg total) by mouth daily. 90 tablet 3   tirzepatide (ZEPBOUND) 2.5 MG/0.5ML Pen Inject 2.5 mg into the skin once a week. (Patient not taking: Reported on 06/08/2023) 2 mL 2   Current Facility-Administered Medications  Medication Dose Route Frequency Provider Last Rate Last Admin   0.9 %  sodium chloride infusion  500 mL Intravenous Once Napoleon Form, MD        Allergies as of 06/08/2023   (No Known Allergies)  Family History  Problem Relation Age of Onset   Pancreatic cancer Father        deceased   Hypertension Father    Breast cancer Mother 59   Angina Mother    Hypertension Mother    Colon cancer Neg Hx    Colon polyps Neg Hx    Rectal cancer Neg Hx    Stomach cancer Neg Hx     Social History   Socioeconomic History   Marital status: Married    Spouse name: Not on file   Number of children: 4   Years of education: Not on file   Highest education level: Not on file  Occupational History   Not on file  Tobacco Use   Smoking status: Former    Current packs/day: 0.00    Types: Cigarettes    Quit date: 07/04/2014    Years since quitting: 8.9   Smokeless tobacco: Never  Vaping Use   Vaping status: Never Used  Substance and  Sexual Activity   Alcohol use: No    Alcohol/week: 0.0 standard drinks of alcohol   Drug use: No   Sexual activity: Not on file  Other Topics Concern   Not on file  Social History Narrative   Hh  Of 2    Pet dog    Off tobacco.   [employer].     Teacher runs school.   8 - 3    G4 P4    Husband had cva and blocked carotids    Left handed   Drinks caffeine   Two story home   Social Determinants of Health   Financial Resource Strain: Low Risk  (09/30/2022)   Overall Financial Resource Strain (CARDIA)    Difficulty of Paying Living Expenses: Not hard at all  Food Insecurity: No Food Insecurity (09/30/2022)   Hunger Vital Sign    Worried About Running Out of Food in the Last Year: Never true    Ran Out of Food in the Last Year: Never true  Transportation Needs: No Transportation Needs (09/30/2022)   PRAPARE - Administrator, Civil Service (Medical): No    Lack of Transportation (Non-Medical): No  Physical Activity: Inactive (09/30/2022)   Exercise Vital Sign    Days of Exercise per Week: 0 days    Minutes of Exercise per Session: 0 min  Stress: No Stress Concern Present (09/30/2022)   [employer] of Occupational Health - Occupational Stress Questionnaire    Feeling of Stress : Not at all  Social Connections: Socially Integrated (09/30/2022)   Social Connection and Isolation Panel [NHANES]    Frequency of Communication with Friends and Family: More than three times a week    Frequency of Social Gatherings with Friends and Family: More than three times a week    Attends Religious Services: More than 4 times per year    Active Member of [employer] or Organizations: Yes    Attends Engineer, structural: More than 4 times per year    Marital Status: Married  Catering manager Violence: Not At Risk (09/30/2022)   Humiliation, Afraid, Rape, and Kick questionnaire    Fear of Current or Ex-Partner: No    Emotionally Abused: No    Physically Abused: No     Sexually Abused: No    Review of Systems:  All other review of systems negative except as mentioned in the HPI.  Physical Exam: Vital signs in last 24 hours: BP (!) 153/85  Pulse 79   Temp 97.8 F (36.6 C) (Skin)   Resp 15   Ht 5\' 3"  (1.6 m)   Wt 154 lb (69.9 kg)   SpO2 98%   BMI 27.28 kg/m  General:   Alert, NAD Lungs:  Clear .   Heart:  Regular rate and rhythm Abdomen:  Soft, nontender and nondistended. Neuro/Psych:  Alert and cooperative. Normal mood and affect. A and O x 3  Reviewed labs, radiology imaging, old records and pertinent past GI work up  Patient is appropriate for planned procedure(s) and anesthesia in an ambulatory setting   K. Scherry Ran , MD [phone]

## 2023-06-08 NOTE — Progress Notes (Unsigned)
Vss nad trans to pacu 

## 2023-06-09 ENCOUNTER — Telehealth: Payer: Self-pay

## 2023-06-09 NOTE — Telephone Encounter (Signed)
Follow up call to pt, lm for pt to call if having any difficulty with normal activities or eating and drinking.  Also to call if any other questions or concerns.  

## 2023-06-11 DIAGNOSIS — M25552 Pain in left hip: Secondary | ICD-10-CM | POA: Diagnosis not present

## 2023-06-11 DIAGNOSIS — M9903 Segmental and somatic dysfunction of lumbar region: Secondary | ICD-10-CM | POA: Diagnosis not present

## 2023-06-11 DIAGNOSIS — M5451 Vertebrogenic low back pain: Secondary | ICD-10-CM | POA: Diagnosis not present

## 2023-06-11 DIAGNOSIS — M9901 Segmental and somatic dysfunction of cervical region: Secondary | ICD-10-CM | POA: Diagnosis not present

## 2023-06-11 LAB — SURGICAL PATHOLOGY

## 2023-06-12 ENCOUNTER — Telehealth: Payer: Self-pay

## 2023-06-12 ENCOUNTER — Encounter: Payer: Self-pay | Admitting: Gastroenterology

## 2023-06-12 NOTE — Progress Notes (Unsigned)
[facility] [address] [place] [address] Phone: [phone] Subjective:   [name], am serving as a scribe for Dr. Antoine Primas.  I'm seeing this patient by the request  of:  Panosh, Neta Mends, MD  CC: back pain follow up   [id number]  05/22/2023 Left-sided lumbar radiculopathy. Discussed with patient's about icing regimen and home exercises. Discussed which activities to do and which ones to avoid. Patient has failed all other conservative therapy including chiropractic care, formal physical therapy, multiple medications including gabapentin and muscle relaxers. Patient's x-rays did show moderate to severe arthritic changes at the L2-L3 and L3-L4 that could be representing some of the radicular symptoms patient is having. Encourage patient to get an MRI at this point. Depending on findings we will see if patient is a candidate for injections. Patient is in agreement with the plan. Follow-up after imaging to discuss further treatment options.   Updated 06/17/2023 Shelley Wright is a 77 y.o. female coming in with complaint of back pain. Patient wants to discuss MRI results.  Patient did have an MRI and continues to have pain on a daily basis.  Has failed formal physical therapy twice, multiple medications including gabapentin.  Feels like it is affecting all daily activities and even waking her up at night.  Having difficulty even going to the store for a long amount of time secondary to the discomfort.  MRI did show that there is an L3-L4 and L4-L5 severe spinal stenosis that is likely contributing to her symptoms.    Past Medical History:  Diagnosis Date   Anxiety    no per pt   Arthritis    Cataract    GERD (gastroesophageal reflux disease)    Heart murmur    'YEARS AGO"   History of smoking    History of ulcerative colitis 2005   pancolitis    Hyperlipidemia    Hypertension    Normal nuclear stress test 2008   myoview     Ulcerative colitis (HCC)    Past Surgical History:  Procedure Laterality Date   BREAST BIOPSY Left 1986   biopsy x 2    COLONOSCOPY     KNEE SURGERY Left october   x 2   UPPER GASTROINTESTINAL ENDOSCOPY     Social History   Socioeconomic History   Marital status: Married    Spouse name: Not on file   Number of children: 4   Years of education: Not on file   Highest education level: Not on file  Occupational History   Not on file  Tobacco Use   Smoking status: Former    Current packs/day: 0.00    Types: Cigarettes    Quit date: 07/04/2014    Years since quitting: 8.9   Smokeless tobacco: Never  Vaping Use   Vaping status: Never Used  Substance and Sexual Activity   Alcohol use: No    Alcohol/week: 0.0 standard drinks of alcohol   Drug use: No   Sexual activity: Not on file  Other Topics Concern   Not on file  Social History Narrative   Hh  Of 2    Pet dog    Off tobacco.   [employer].     Teacher runs school.   8 - 3    G4 P4    Husband had cva and blocked carotids    Left handed   Drinks caffeine   Two story home   Social  Determinants of Health   Financial Resource Strain: Low Risk  (09/30/2022)   Overall Financial Resource Strain (CARDIA)    Difficulty of Paying Living Expenses: Not hard at all  Food Insecurity: No Food Insecurity (09/30/2022)   Hunger Vital Sign    Worried About Running Out of Food in the Last Year: Never true    Ran Out of Food in the Last Year: Never true  Transportation Needs: No Transportation Needs (09/30/2022)   PRAPARE - Administrator, Civil Service (Medical): No    Lack of Transportation (Non-Medical): No  Physical Activity: Inactive (09/30/2022)   Exercise Vital Sign    Days of Exercise per Week: 0 days    Minutes of Exercise per Session: 0 min  Stress: No Stress Concern Present (09/30/2022)   [employer] of Occupational Health - Occupational Stress Questionnaire    Feeling of Stress : Not at all   Social Connections: Socially Integrated (09/30/2022)   Social Connection and Isolation Panel [NHANES]    Frequency of Communication with Friends and Family: More than three times a week    Frequency of Social Gatherings with Friends and Family: More than three times a week    Attends Religious Services: More than 4 times per year    Active Member of [employer] or Organizations: Yes    Attends Engineer, structural: More than 4 times per year    Marital Status: Married   No Known Allergies Family History  Problem Relation Age of Onset   Pancreatic cancer Father        deceased   Hypertension Father    Breast cancer Mother 33   Angina Mother    Hypertension Mother    Colon cancer Neg Hx    Colon polyps Neg Hx    Rectal cancer Neg Hx    Stomach cancer Neg Hx      Current Outpatient Medications (Cardiovascular):    ezetimibe (ZETIA) 10 MG tablet, Take 1 tablet (10 mg total) by mouth daily. Please schedule appointment for refills. 1st attempt   lisinopril (ZESTRIL) 10 MG tablet, TAKE 1 TABLET EVERY DAY   rosuvastatin (CRESTOR) 40 MG tablet, Take 1 tablet (40 mg total) by mouth daily. Please call [phone] to schedule an appointment for future refills. Thank you. 1st attempt.     Current Outpatient Medications (Other):    escitalopram (LEXAPRO) 10 MG tablet, TAKE 1 TABLET EVERY DAY   famotidine (PEPCID) 20 MG tablet, Take 1 tablet (20 mg total) by mouth daily.   gabapentin (NEURONTIN) 100 MG capsule, Take 2 capsules (200 mg total) by mouth at bedtime.   pantoprazole (PROTONIX) 40 MG tablet, Take 1 tablet (40 mg total) by mouth 2 (two) times daily. Take twice daily for 2 months then decrease to once daily.   tirzepatide (ZEPBOUND) 2.5 MG/0.5ML Pen, Inject 2.5 mg into the skin once a week.   Reviewed prior external information including notes and imaging from  primary care provider As well as notes that were available from care everywhere and other healthcare  systems.  Past medical history, social, surgical and family history all reviewed in electronic medical record.  No pertanent information unless stated regarding to the chief complaint.   Review of Systems:  No headache, visual changes, nausea, vomiting, diarrhea, constipation, dizziness, abdominal pain, skin rash, fevers, chills, night sweats, weight loss, swollen lymph nodes, body aches, joint swelling, chest pain, shortness of breath, mood changes. POSITIVE muscle aches  Objective  Blood pressure 110/78,  pulse 80, height 5\' 3"  (1.6 m), SpO2 95%.   General: No apparent distress alert and oriented x3 mood and affect normal, dressed appropriately.  HEENT: Pupils equal, extraocular movements intact  Respiratory: Patient's speak in full sentences and does not appear short of breath  Cardiovascular: No lower extremity edema, non tender, no erythema  Low back has significant loss of lordosis.  Unable to get even to neutral with extension of the back at the moment.  Worsening pain with extension of the back.  Does have some mild atrophy of the lower extremities.  Deep tendon reflexes are intact    Impression and Recommendations:    The above documentation has been reviewed and is accurate and complete Judi Saa, DO

## 2023-06-12 NOTE — Telephone Encounter (Signed)
Can you call and ask about the MRI results please

## 2023-06-15 ENCOUNTER — Other Ambulatory Visit (HOSPITAL_BASED_OUTPATIENT_CLINIC_OR_DEPARTMENT_OTHER): Payer: Self-pay | Admitting: Cardiology

## 2023-06-15 DIAGNOSIS — I2510 Atherosclerotic heart disease of native coronary artery without angina pectoris: Secondary | ICD-10-CM

## 2023-06-17 ENCOUNTER — Ambulatory Visit: Payer: Medicare HMO | Admitting: Family Medicine

## 2023-06-17 VITALS — BP 110/78 | HR 80 | Ht 63.0 in

## 2023-06-17 DIAGNOSIS — M48062 Spinal stenosis, lumbar region with neurogenic claudication: Secondary | ICD-10-CM | POA: Diagnosis not present

## 2023-06-17 NOTE — Assessment & Plan Note (Signed)
Significant lumbar spinal stenosis at L3-L4 and L4-L5 consistent with patient's symptoms of the gluteal pain and some of the leg pain.  Patient given different choices including the possibility of an epidural done with this patient having significant amount of pain and starting to have more difficulty with even activities of daily living we will refer patient to orthopedic surgery with back specialty to discuss the potential for any surgical intervention.  All questions were answered today.  Continue the gabapentin.  Patient knows we are here if she has any other questions.

## 2023-06-18 ENCOUNTER — Encounter: Payer: Self-pay | Admitting: Family Medicine

## 2023-06-22 ENCOUNTER — Other Ambulatory Visit: Payer: Self-pay

## 2023-06-22 ENCOUNTER — Telehealth: Payer: Self-pay | Admitting: Family Medicine

## 2023-06-22 DIAGNOSIS — M48062 Spinal stenosis, lumbar region with neurogenic claudication: Secondary | ICD-10-CM

## 2023-06-22 NOTE — Telephone Encounter (Signed)
Referral placed. Sent patient a message in MyChart.

## 2023-06-22 NOTE — Telephone Encounter (Signed)
Patient called with a question about the referral to Dr Yevette Edwards. She said that Dr Katrinka Blazing mentioned that we were going to call to try to get her in there early this week?  I did not see a referral in her chart.  Please advise.

## 2023-07-03 DIAGNOSIS — G959 Disease of spinal cord, unspecified: Secondary | ICD-10-CM | POA: Diagnosis not present

## 2023-07-07 DIAGNOSIS — M542 Cervicalgia: Secondary | ICD-10-CM | POA: Diagnosis not present

## 2023-07-08 ENCOUNTER — Other Ambulatory Visit: Payer: Self-pay | Admitting: Internal Medicine

## 2023-07-15 ENCOUNTER — Other Ambulatory Visit: Payer: Self-pay | Admitting: Nurse Practitioner

## 2023-07-20 ENCOUNTER — Telehealth: Payer: Self-pay | Admitting: Gastroenterology

## 2023-07-20 DIAGNOSIS — M542 Cervicalgia: Secondary | ICD-10-CM | POA: Diagnosis not present

## 2023-07-20 DIAGNOSIS — M5416 Radiculopathy, lumbar region: Secondary | ICD-10-CM | POA: Diagnosis not present

## 2023-07-20 NOTE — Telephone Encounter (Signed)
Inbound call from patient, would like to discuss rebilling for procedure, she states she was billed for a surgical procedure and her insurance did not cover 100%.

## 2023-07-21 DIAGNOSIS — M48062 Spinal stenosis, lumbar region with neurogenic claudication: Secondary | ICD-10-CM | POA: Diagnosis not present

## 2023-07-22 NOTE — Telephone Encounter (Signed)
Left message for patient to call back Patient was scheduled for a procedure due to her hx of UC, bloody diarrhea, and lower abdominal pain.  Procedure was not scheduled as a screening and would not be covered as a screening benefit.  I left a message for the patient to call back and discuss.

## 2023-07-23 NOTE — Telephone Encounter (Signed)
PT is returning call. Please advise.  

## 2023-07-23 NOTE — Telephone Encounter (Signed)
[name], I just called the patient and I re-reviewed every word of her office visit as documented: Shelley Wright is a 77 year old female with a past medical history of anxiety, arthritis, upper lipidemia, hypertension, GERD and ulcerative colitis initially diagnosed in 2001 previously treated with Sulfasalazine.  She presents today to schedule a colonoscopy.  She typically passes a normal brown bowel movement most days, however, she has nonbloody urgent diarrhea x 3 episodes over the past 6 months, last episode occurred 1 week ago.  She has intermittent lower abdominal discomfort which occurs once weekly and last for few hours.  She takes Advil or Aleve 3 tabs twice daily for arthritis and left hip bursitis.  No black stools.  She stated having a lot of heartburn which is notably triggered by eating tomato products or other acidic foods or drinking wine.  She takes Tums as needed.  She describes having difficulty swallowing liquids or saliva which results in coughing, feels like fluids go down the wrong pipe.  No difficulty swallowing solid foods.  Her most recent colonoscopy was 12/30/2019 which showed pan ulcerative colitis/quiescent ulcerative colitis and one tubular adenomatous polyp was removed from the cecum.  She was advised to repeat a colonoscopy in 3 years.  She is no longer taking Sulfasalazine, it is unclear when she stopped taking this medication.  She underwent an EGD 09/25/2017 which showed an inlet patch to the proximal esophagus otherwise was normal.   I explained to the patient that the upper endoscopy was recommended due to her reporting "having a lot of heartburn which is triggered by eating tomato products, acidic foods and wine and that she described having difficulty swallowing liquids or saliva, feels like fluid goes down the wrong pipe" and I did not order an EGD due to abdominal pain.  I also explained the patient that  I have never in my entire career ever told the patient "you are having  a colonoscopy so you might as well have the other".  However, I did explain to the patient since she is due for a colonoscopy and she has heartburn and dysphagia symptoms, it is appropriate to do an upper endoscopy at the same time of the colonoscopy.  As noted above, I did not document any rectal bleeding but stated she has had nonbloody urgent diarrhea x 3 episodes over the past 6 months. I Informed the patient that I was not confusing her with any other patient.  I addressed all the patient's questions to the best of my ability.  I explained to the patient that Lavonna Rua would address her concerns regarding billing issues.  She appreciated the phone call.

## 2023-07-23 NOTE — Telephone Encounter (Signed)
I returned the call to the patient. She questions why she received a bill for her procedures. Patient had a colonoscopy and upper endoscopy with dilation on 06/08/23.  She reports that she has had the same insurance every time she has one and they always pay 100% and she should not have a bill.   I explained that her procedure was not a routine screening procedure and that she reported symptoms in her office visit with Alcide Evener, RNP  in July and she has a hx of UC.  Patient also had an EGD and I explained that this is not a screening procedure and would not be covered at 100% ever.   I notified the patient that she had [facility] Medicare for her last colonoscopy in 2021 and now she has [facility].  While both Medicare plans they do not pay the same.    She went on to dispute that she ever told Jill Side she had any symptoms.  She said she never discussed with Jill Side that she had 3 episodes of bleeding in a 6 month period or ever has abdominal pain.  Patient would like to speak with [facility] about this.    I explained to the patient with the limited billing access I have that her insurance paid all but $386 for the procedures she had in September and that they deemed this was her responsibility.  She says she didn't really need the procedure and Colleen told her "you are having the colonoscopy you might as well have the other".  Patient would like to speak with [facility] directly to find out where she got this information.  "Perhaps she is confusing me with another patient, I never ever told her that".  I advised the patient that I will have Colleen call her back.

## 2023-07-29 ENCOUNTER — Telehealth: Payer: Self-pay | Admitting: Cardiology

## 2023-07-29 ENCOUNTER — Encounter (HOSPITAL_BASED_OUTPATIENT_CLINIC_OR_DEPARTMENT_OTHER): Payer: Self-pay

## 2023-07-29 NOTE — Telephone Encounter (Signed)
Pt c/o medication issue:  1. Name of Medication:  rosuvastatin (CRESTOR) 40 MG tablet  ezetimibe (ZETIA) 10 MG tablet  2. How are you currently taking this medication (dosage and times per day)?  As prescribed  3. Are you having a reaction (difficulty breathing--STAT)?   4. What is your medication issue?   Patient states she has been experiencing severe muscle pain in her arms. She states it keeps her up at night and she assumes it may be due to one of these medications. She would like to know if the dose can be changed.

## 2023-08-05 DIAGNOSIS — M48062 Spinal stenosis, lumbar region with neurogenic claudication: Secondary | ICD-10-CM | POA: Diagnosis not present

## 2023-08-05 DIAGNOSIS — M79601 Pain in right arm: Secondary | ICD-10-CM | POA: Diagnosis not present

## 2023-08-05 DIAGNOSIS — M79602 Pain in left arm: Secondary | ICD-10-CM | POA: Diagnosis not present

## 2023-08-06 ENCOUNTER — Telehealth: Payer: Self-pay

## 2023-08-06 ENCOUNTER — Telehealth: Payer: Self-pay | Admitting: Cardiology

## 2023-08-06 ENCOUNTER — Telehealth: Payer: Self-pay | Admitting: Gastroenterology

## 2023-08-06 DIAGNOSIS — M79601 Pain in right arm: Secondary | ICD-10-CM | POA: Diagnosis not present

## 2023-08-06 DIAGNOSIS — M48062 Spinal stenosis, lumbar region with neurogenic claudication: Secondary | ICD-10-CM | POA: Diagnosis not present

## 2023-08-06 DIAGNOSIS — I2510 Atherosclerotic heart disease of native coronary artery without angina pectoris: Secondary | ICD-10-CM

## 2023-08-06 DIAGNOSIS — M79602 Pain in left arm: Secondary | ICD-10-CM | POA: Diagnosis not present

## 2023-08-06 NOTE — Telephone Encounter (Signed)
Received a nonclinical Telephone Record from [place], [place] about pt requesting to speak with Dr. Fabian Sharp.   Initial comment stated:  " Caller states that she has been having issues with her back and walking. She went to the doctor and they advised surgery but they needed to do an epidural first. She had the epidural done today. BP is registered 175/109- but she has recently gone off of her one of her medications to see if it helped the muscle pain. She is taking a lot of medications and needs to make sure Dr. Fabian Sharp reviews this message and calls her back. She also had a tissue nerve study because her arms are hurting badly. She had the MRI done on her neck and they do not believe it is due to pinched nerve."  Contacted pt.  Pt reports she had her epidural injection at [facility] and they didn't released her until her BP went down. Pt states her BP went down to 180/100. Pt denied of any chest pain nor  change in vision and states she always has SOB and seeing cardiology for it. Pt states she is taking her Lisinopril 10 mg daily.   Pt states she went off of rosuvastatin for 2wks advise by her cardiology. She updates her muscle pain is better but didn't go away.   Pt complain that she is taking too many medications and wants to know if she can stop taking her GI medications. Advise pt to talk to her GI provider on that.   Encourage pt to be seen for high BP as PCP is not in office. Pt is okay with it. Pt request to be seen with female provider. Appt is schedule with Dr. Salomon Fick.  Pt reports she has been having doctor appt every day and that causes her BP to go up. Would like a call instead. Inform pt due to her high BP, she would need to be seen in person. Advice pt to head to ER if she is experiencing any chest pain, change in vision or SOB. Pt verbalized understanding.   Pt states she will contact us tomorrow to give update on her BP. Inform pt that I will be off but someone will help her.  Forwarding  to provider.

## 2023-08-06 NOTE — Telephone Encounter (Signed)
Patient called stated that she longer wishes to be on Pantropazole 40 mg and Famotidine 20 mg. She also stated that if she is really in need of this medication she will call back and have them possible refilled. Please advise.

## 2023-08-06 NOTE — Telephone Encounter (Signed)
I will pass this message onto Dr Lavon Paganini.

## 2023-08-06 NOTE — Telephone Encounter (Signed)
Pt c/o medication issue:  1. Name of Medication: ezetimibe (ZETIA) 10 MG tablet   rosuvastatin (CRESTOR) 40 MG tablet   2. How are you currently taking this medication (dosage and times per day)?    3. Are you having a reaction (difficulty breathing--STAT)? no  4. What is your medication issue? Patient calling back to see what she needs to do. She states she been off the two medication for two weeks not.

## 2023-08-06 NOTE — Telephone Encounter (Signed)
Called and left VM per DPR.   Alver Sorrow, NP

## 2023-08-07 ENCOUNTER — Encounter: Payer: Self-pay | Admitting: Family Medicine

## 2023-08-07 ENCOUNTER — Ambulatory Visit (INDEPENDENT_AMBULATORY_CARE_PROVIDER_SITE_OTHER): Payer: Medicare HMO | Admitting: Family Medicine

## 2023-08-07 ENCOUNTER — Telehealth: Payer: Self-pay | Admitting: Cardiology

## 2023-08-07 VITALS — BP 152/80 | HR 70 | Temp 98.4°F | Ht 63.0 in | Wt 159.2 lb

## 2023-08-07 DIAGNOSIS — I10 Essential (primary) hypertension: Secondary | ICD-10-CM

## 2023-08-07 DIAGNOSIS — Z23 Encounter for immunization: Secondary | ICD-10-CM | POA: Diagnosis not present

## 2023-08-07 MED ORDER — EZETIMIBE 10 MG PO TABS
10.0000 mg | ORAL_TABLET | Freq: Every day | ORAL | 1 refills | Status: DC
Start: 2023-08-07 — End: 2023-11-05

## 2023-08-07 MED ORDER — PRAVASTATIN SODIUM 40 MG PO TABS
40.0000 mg | ORAL_TABLET | Freq: Every evening | ORAL | 3 refills | Status: DC
Start: 2023-08-07 — End: 2023-11-05

## 2023-08-07 NOTE — Telephone Encounter (Signed)
Ok

## 2023-08-07 NOTE — Telephone Encounter (Signed)
Returned call to patient regarding medications; refused having the same reactions with lower dose of Rosuvastatin. Went over new recommendations from APP and she is in agreement. New rx sent to preferred pharmacy and lab orders mailed.

## 2023-08-07 NOTE — Telephone Encounter (Signed)
Notes her arm pain is better but not resolved - encouraged to discuss with PCP. Requests to try different statin.   START Pravastatin 40mg  daily  RESUME Zetia 10mg  daily  Will mail FLP/LFT to be collected in 2-3 months to her home.  Alver Sorrow, NP

## 2023-08-07 NOTE — Addendum Note (Signed)
Addended by: Alver Sorrow on: 08/07/2023 11:02 AM   Modules accepted: Orders

## 2023-08-07 NOTE — Patient Instructions (Addendum)
Continue checking your BP at home, if consistently (over the next 1-2 wks) greater than 140/90, take 2 of the lisinopril 10 mg tabs for a total dose of 20 mg daily and notify clinic.  You should set up a follow-up appointment with your PCP, Dr. Fabian Sharp in the next 2 to 4 weeks to continue monitoring your blood pressure.  The last time you had your vitamin B12 checked, a few years ago, it was normal.

## 2023-08-07 NOTE — Telephone Encounter (Signed)
Pt c/o medication issue:  1. Name of Medication: ezetimibe (ZETIA) 10 MG tablet    rosuvastatin (CRESTOR) 40 MG tablet   2. How are you currently taking this medication (dosage and times per day)? As written  3. Are you having a reaction (difficulty breathing--STAT)? No  4. What is your medication issue? Patient is returning call in regards to both of these medications. Patient mentioned she is still having pain in her arm, but it is not as bad as it was. Please advise.

## 2023-08-07 NOTE — Progress Notes (Signed)
Established Patient Office Visit   Subjective  Patient ID: Shelley Wright, female    DOB: 10-27-1945  Age: 77 y.o. MRN: [id number]  Chief Complaint  Patient presents with   Hypertension     epidural done yesterday, BP was registered 175/109 yesterday - but she has recently gone off of her one of her medications to see if it helped the muscle pain.     Patient is a 77 year old female followed by Dr. Fabian Sharp and seen for acute concern.  Pt with elevated blood pressure at appointment for epidural for back pain from 08/06/2023.  BP at appointment was 175/109.  Earlier this week patient had EMG/NCS to evaluate pain in upper extremities.  BP typically controlled on lisinopril 10 mg daily.  Patient denies changes in diet.  Not drinking any water, mostly coffee daily.  Has noticed BP coming down, was 159/80 this morning when checked.  Patient denies headaches, CP, changes in vision.  Patient inquires about influenza vaccine.  Patient also ask if she needs a B12 shot.  Advised not unless her B12 was low.  Hypertension   Patient Active Problem List   Diagnosis Date Noted   Spinal stenosis, lumbar region, with neurogenic claudication 06/17/2023   Acute lumbar radiculopathy 05/22/2023   Greater trochanteric bursitis of left hip 01/07/2023   Recurrent left knee instability 01/07/2023   Pre-diabetes 03/28/2022   Family history of cardiovascular disease 07/05/2012   Varicose veins 07/05/2012   Ex-smoker 11/14/2011   Myalgia 04/05/2011   History of ulcerative colitis    CHEST WALL PAIN, ANTERIOR 07/02/2009   ULCERATIVE COLITIS, HX OF 04/17/2008   HYPERLIPIDEMIA 04/15/2007   ANXIETY 04/15/2007   DEPRESSION 04/15/2007   HYPERTENSION 04/15/2007   Past Medical History:  Diagnosis Date   Anxiety    no per pt   Arthritis    Cataract    GERD (gastroesophageal reflux disease)    Heart murmur    'YEARS AGO"   History of smoking    History of ulcerative colitis 2005   pancolitis     Hyperlipidemia    Hypertension    Normal nuclear stress test 2008   myoview    Ulcerative colitis (HCC)    Past Surgical History:  Procedure Laterality Date   BREAST BIOPSY Left 1986   biopsy x 2    COLONOSCOPY     KNEE SURGERY Left october   x 2   UPPER GASTROINTESTINAL ENDOSCOPY     Social History   Tobacco Use   Smoking status: Former    Current packs/day: 0.00    Types: Cigarettes    Quit date: 07/04/2014    Years since quitting: 9.0   Smokeless tobacco: Never  Vaping Use   Vaping status: Never Used  Substance Use Topics   Alcohol use: No    Alcohol/week: 0.0 standard drinks of alcohol   Drug use: No   Family History  Problem Relation Age of Onset   Pancreatic cancer Father        deceased   Hypertension Father    Breast cancer Mother 71   Angina Mother    Hypertension Mother    Colon cancer Neg Hx    Colon polyps Neg Hx    Rectal cancer Neg Hx    Stomach cancer Neg Hx    No Known Allergies    ROS Negative unless stated above    Objective:     BP (!) 152/80 (BP Location: Left Arm, Patient Position: Sitting, Cuff  Size: Normal)   Pulse 70   Temp 98.4 F (36.9 C) (Oral)   Ht 5\' 3"  (1.6 m)   Wt 159 lb 3.2 oz (72.2 kg)   SpO2 97%   BMI 28.20 kg/m  BP Readings from Last 3 Encounters:  08/07/23 (!) 152/80  06/17/23 110/78  06/08/23 (!) 143/68   Wt Readings from Last 3 Encounters:  08/07/23 159 lb 3.2 oz (72.2 kg)  06/08/23 154 lb (69.9 kg)  05/19/23 154 lb 9.6 oz (70.1 kg)      Physical Exam Constitutional:      Appearance: Normal appearance.  HENT:     Head: Normocephalic and atraumatic.     Nose: Nose normal.     Mouth/Throat:     Mouth: Mucous membranes are moist.  Eyes:     Extraocular Movements: Extraocular movements intact.     Conjunctiva/sclera: Conjunctivae normal.  Cardiovascular:     Rate and Rhythm: Normal rate.  Pulmonary:     Effort: Pulmonary effort is normal.  Skin:    General: Skin is warm and dry.   Neurological:     Mental Status: She is alert and oriented to person, place, and time.     No results found for any visits on 08/07/23.    Assessment & Plan:  Essential hypertension  Need for influenza vaccination -     Flu Vaccine Trivalent High Dose (Fluad)   Recent controlled BP likely 2/2 EMG/NCS testing and epidural injection this week.  Also consider anxiety preprocedures.  BP remains elevated but improving.  Recheck, clinic.  Patient advised on the importance of drinking water.  Continue lisinopril 10 mg daily.  If BP consistently remains greater than 140/90 over the next 2 weeks take 2 lisinopril 10 mg tabs for total dose of 20 mg dail.  And notify clinic.  Influenza vaccine given this visit.  No follow-ups on file.  Advised to follow-up with PCP  Deeann Saint, MD

## 2023-08-07 NOTE — Telephone Encounter (Signed)
I marked not taking on her med list.

## 2023-08-11 NOTE — Telephone Encounter (Signed)
Reviewed the message  forwarded from the administrative and clinical team   .Glad you saw dr Salomon Fick about BP  control; ,seems like baseline BP may be modestly  elevated and then get secondary elevation from pain visit stress etc . But we need some type of  follow up  contact.   We can do a fu in person or virtual  after  2-4 weeks of BP management. And then go from there .   Do  you want to have a clinical pharmacy consult to go over the multiple meds and issues?

## 2023-08-11 NOTE — Telephone Encounter (Signed)
Attempted to reach pt. Left a voicemail to call us back.  

## 2023-08-12 NOTE — Telephone Encounter (Signed)
Attempted to reach pt. Left a detail message that information will send to mychart and to call us back if have any questions.

## 2023-08-25 DIAGNOSIS — M7062 Trochanteric bursitis, left hip: Secondary | ICD-10-CM | POA: Diagnosis not present

## 2023-09-21 ENCOUNTER — Telehealth: Payer: Self-pay

## 2023-09-21 NOTE — Progress Notes (Deleted)
 No chief complaint on file.   HPI: Shelley Wright 78 y.o. come in for Chronic disease management   Bp check   Saw dr banks in November after very high bp at procedure and had gone off medication  lisinopril   22 ROS: See pertinent positives and negatives per HPI.  Past Medical History:  Diagnosis Date   Anxiety    no per pt   Arthritis    Cataract    GERD (gastroesophageal reflux disease)    Heart murmur    'YEARS AGO   History of smoking    History of ulcerative colitis 2005   pancolitis    Hyperlipidemia    Hypertension    Normal nuclear stress test 2008   myoview    Ulcerative colitis (HCC)     Family History  Problem Relation Age of Onset   Pancreatic cancer Father        deceased   Hypertension Father    Breast cancer Mother 41   Angina Mother    Hypertension Mother    Colon cancer Neg Hx    Colon polyps Neg Hx    Rectal cancer Neg Hx    Stomach cancer Neg Hx     Social History   Socioeconomic History   Marital status: Married    Spouse name: Not on file   Number of children: 4   Years of education: Not on file   Highest education level: Not on file  Occupational History   Not on file  Tobacco Use   Smoking status: Former    Current packs/day: 0.00    Types: Cigarettes    Quit date: 07/04/2014    Years since quitting: 9.2   Smokeless tobacco: Never  Vaping Use   Vaping status: Never Used  Substance and Sexual Activity   Alcohol use: No    Alcohol/week: 0.0 standard drinks of alcohol   Drug use: No   Sexual activity: Not on file  Other Topics Concern   Not on file  Social History Narrative   Hh  Of 2    Pet dog    Off tobacco.   [employer].     Teacher runs school.   8 - 3    G4 P4    Husband had cva and blocked carotids    Left handed   Drinks caffeine   Two story home   Social Drivers of Health   Financial Resource Strain: Low Risk  (09/30/2022)   Overall Financial Resource Strain (CARDIA)    Difficulty of Paying  Living Expenses: Not hard at all  Food Insecurity: No Food Insecurity (09/30/2022)   Hunger Vital Sign    Worried About Running Out of Food in the Last Year: Never true    Ran Out of Food in the Last Year: Never true  Transportation Needs: No Transportation Needs (09/30/2022)   PRAPARE - Administrator, Civil Service (Medical): No    Lack of Transportation (Non-Medical): No  Physical Activity: Inactive (09/30/2022)   Exercise Vital Sign    Days of Exercise per Week: 0 days    Minutes of Exercise per Session: 0 min  Stress: No Stress Concern Present (09/30/2022)   [employer] of Occupational Health - Occupational Stress Questionnaire    Feeling of Stress : Not at all  Social Connections: Socially Integrated (09/30/2022)   Social Connection and Isolation Panel [NHANES]    Frequency of Communication with Friends and Family: More than three  times a week    Frequency of Social Gatherings with Friends and Family: More than three times a week    Attends Religious Services: More than 4 times per year    Active Member of Clubs or Organizations: Yes    Attends Engineer, Structural: More than 4 times per year    Marital Status: Married    Outpatient Medications Prior to Visit  Medication Sig Dispense Refill   escitalopram  (LEXAPRO ) 10 MG tablet TAKE 1 TABLET EVERY DAY 90 tablet 3   ezetimibe  (ZETIA ) 10 MG tablet Take 1 tablet (10 mg total) by mouth daily. 90 tablet 1   gabapentin  (NEURONTIN ) 100 MG capsule Take 2 capsules (200 mg total) by mouth at bedtime. 180 capsule 0   lisinopril  (ZESTRIL ) 10 MG tablet TAKE 1 TABLET EVERY DAY 90 tablet 3   pravastatin  (PRAVACHOL ) 40 MG tablet Take 1 tablet (40 mg total) by mouth every evening. 90 tablet 3   No facility-administered medications prior to visit.     EXAM:  There were no vitals taken for this visit.  There is no height or weight on file to calculate BMI.  GENERAL: vitals reviewed and listed above, alert,  oriented, appears well hydrated and in no acute distress HEENT: atraumatic, conjunctiva  clear, no obvious abnormalities on inspection of external nose and ears NECK: no obvious masses on inspection palpation  LUNGS: clear to auscultation bilaterally, no wheezes, rales or rhonchi, good air movement CV: HRRR, no clubbing cyanosis or  peripheral edema nl cap refill  MS: moves all extremities without noticeable focal  abnormality PSYCH: pleasant and cooperative, no obvious depression or anxiety Lab Results  Component Value Date   WBC 10.9 (H) 04/13/2023   HGB 12.7 04/13/2023   HCT 39.1 04/13/2023   PLT 238.0 04/13/2023   GLUCOSE 94 11/25/2022   CHOL 156 11/25/2022   TRIG 88.0 11/25/2022   HDL 69.00 11/25/2022   LDLDIRECT 138.5 10/17/2013   LDLCALC 69 11/25/2022   ALT 25 11/25/2022   AST 27 11/25/2022   NA 140 11/25/2022   K 4.1 11/25/2022   CL 102 11/25/2022   CREATININE 0.91 11/25/2022   BUN 26 (H) 11/25/2022   CO2 30 11/25/2022   TSH 2.11 03/13/2022   HGBA1C 5.5 11/25/2022   BP Readings from Last 3 Encounters:  08/07/23 (!) 152/80  06/17/23 110/78  06/08/23 (!) 143/68    ASSESSMENT AND PLAN:  Discussed the following assessment and plan:  No diagnosis found.  -Patient advised to return or notify health care team  if  new concerns arise.  There are no Patient Instructions on file for this visit.   Rocklyn Mayberry K. Keaundre Thelin M.D.

## 2023-09-21 NOTE — Telephone Encounter (Signed)
 Copied from CRM [id number]. Topic: General - Other >> Sep 21, 2023  2:23 PM Adele Barthel wrote: Reason for CRM: Patient would like to know if she will need to have labwork performed prior to her AWV on 05/07. Requests call back at [phone] (M)

## 2023-09-22 ENCOUNTER — Ambulatory Visit: Payer: Medicare Other | Admitting: Internal Medicine

## 2023-09-23 DIAGNOSIS — M25552 Pain in left hip: Secondary | ICD-10-CM | POA: Diagnosis not present

## 2023-09-23 DIAGNOSIS — R42 Dizziness and giddiness: Secondary | ICD-10-CM | POA: Diagnosis not present

## 2023-09-23 DIAGNOSIS — M6281 Muscle weakness (generalized): Secondary | ICD-10-CM | POA: Diagnosis not present

## 2023-09-23 DIAGNOSIS — M5459 Other low back pain: Secondary | ICD-10-CM | POA: Diagnosis not present

## 2023-09-23 NOTE — Telephone Encounter (Signed)
 Spoke to pt.   Inform pt she will needs a lab for her physical appt with Dr. Charlett but not Subsequential AWV. Inform pt she has two appt for AWV. Pt would like to cancel the telephone visit and keep in person visit.   Pt reports she had went to see her PT and brought up about her dizziness. Pt was advise to discuss with pcp about her medication. Pt request to speak with Dr. Charlett. Schedule appt on 09/29/2023 for video visit.   Pt requests to move her physical appt earlier. Appt moved to 11/04/2023 at 2pm. Pt would like to get lab done a wk prior to appt. Pt request to add cholesterol and B12 as part of her blood work. Please advise.   Lab appt schedule.

## 2023-09-25 DIAGNOSIS — M5459 Other low back pain: Secondary | ICD-10-CM | POA: Diagnosis not present

## 2023-09-25 DIAGNOSIS — R42 Dizziness and giddiness: Secondary | ICD-10-CM | POA: Diagnosis not present

## 2023-09-25 DIAGNOSIS — M25552 Pain in left hip: Secondary | ICD-10-CM | POA: Diagnosis not present

## 2023-09-25 DIAGNOSIS — M6281 Muscle weakness (generalized): Secondary | ICD-10-CM | POA: Diagnosis not present

## 2023-09-29 ENCOUNTER — Encounter: Payer: Self-pay | Admitting: Internal Medicine

## 2023-09-29 ENCOUNTER — Other Ambulatory Visit: Payer: Self-pay | Admitting: Internal Medicine

## 2023-09-29 ENCOUNTER — Telehealth (INDEPENDENT_AMBULATORY_CARE_PROVIDER_SITE_OTHER): Payer: Medicare Other | Admitting: Internal Medicine

## 2023-09-29 VITALS — BP 128/82 | Ht 63.0 in | Wt 159.0 lb

## 2023-09-29 DIAGNOSIS — E785 Hyperlipidemia, unspecified: Secondary | ICD-10-CM

## 2023-09-29 DIAGNOSIS — M48061 Spinal stenosis, lumbar region without neurogenic claudication: Secondary | ICD-10-CM

## 2023-09-29 DIAGNOSIS — Z79899 Other long term (current) drug therapy: Secondary | ICD-10-CM

## 2023-09-29 DIAGNOSIS — M791 Myalgia, unspecified site: Secondary | ICD-10-CM

## 2023-09-29 DIAGNOSIS — R7303 Prediabetes: Secondary | ICD-10-CM

## 2023-09-29 DIAGNOSIS — M6281 Muscle weakness (generalized): Secondary | ICD-10-CM | POA: Diagnosis not present

## 2023-09-29 DIAGNOSIS — R42 Dizziness and giddiness: Secondary | ICD-10-CM

## 2023-09-29 DIAGNOSIS — M5459 Other low back pain: Secondary | ICD-10-CM | POA: Diagnosis not present

## 2023-09-29 DIAGNOSIS — M25552 Pain in left hip: Secondary | ICD-10-CM | POA: Diagnosis not present

## 2023-09-29 DIAGNOSIS — R2689 Other abnormalities of gait and mobility: Secondary | ICD-10-CM

## 2023-09-29 DIAGNOSIS — I10 Essential (primary) hypertension: Secondary | ICD-10-CM

## 2023-09-29 NOTE — Progress Notes (Signed)
 Virtual Visit via Video Note  I connected with Shelley Wright on 09/29/23 at  3:00 PM EST by a video enabled telemedicine application and verified that I am speaking with the correct person using two identifiers. Location patient: home Location provider:work office Persons participating in the virtual visit: patient, provider   Patient aware  of the limitations of evaluation and management by telemedicine and  availability of in person appointments. and agreed to proceed.   HPI: Shelley Wright presents for video visit  because  PT advised checked   because she has transient dizziness when bends over or moving  a certain way . No syncope cp sob  however   muscles feels weak in legs  and hard to get up if  falls to ground.   Mri back and neck per Dr    Beuford   had  epidural  and then hip bothersome  and now going to   integrated therapies.  She has stopped temp crestor  to see if helps ? Minimal help maybe .  Stopped the gabapentin  to see see if helps dizziness  and balance .  Asks about b12 level as friends feel better on b12 shots.   Ask about weight management  again    HT  now taking 20 lisinopril  bp has been good x at back appt  very high toleratinv meds  BP  at home 128/82  ROS: See pertinent positives and negatives per HPI.  Past Medical History:  Diagnosis Date   Anxiety    no per pt   Arthritis    Cataract    GERD (gastroesophageal reflux disease)    Heart murmur    'YEARS AGO   History of smoking    History of ulcerative colitis 2005   pancolitis    Hyperlipidemia    Hypertension    Normal nuclear stress test 2008   myoview    Ulcerative colitis (HCC)     Past Surgical History:  Procedure Laterality Date   BREAST BIOPSY Left 1986   biopsy x 2    COLONOSCOPY     KNEE SURGERY Left october   x 2   UPPER GASTROINTESTINAL ENDOSCOPY      Family History  Problem Relation Age of Onset   Pancreatic cancer Father        deceased   Hypertension Father     Breast cancer Mother 62   Angina Mother    Hypertension Mother    Colon cancer Neg Hx    Colon polyps Neg Hx    Rectal cancer Neg Hx    Stomach cancer Neg Hx     Social History   Tobacco Use   Smoking status: Former    Current packs/day: 0.00    Types: Cigarettes    Quit date: 07/04/2014    Years since quitting: 9.2   Smokeless tobacco: Never  Vaping Use   Vaping status: Never Used  Substance Use Topics   Alcohol use: No    Alcohol/week: 0.0 standard drinks of alcohol   Drug use: No      Current Outpatient Medications:    escitalopram  (LEXAPRO ) 10 MG tablet, TAKE 1 TABLET EVERY DAY, Disp: 90 tablet, Rfl: 3   pravastatin  (PRAVACHOL ) 40 MG tablet, Take 1 tablet (40 mg total) by mouth every evening., Disp: 90 tablet, Rfl: 3   ezetimibe  (ZETIA ) 10 MG tablet, Take 1 tablet (10 mg total) by mouth daily. (Patient not taking: Reported on 09/29/2023), Disp: 90 tablet, Rfl:  1   gabapentin  (NEURONTIN ) 100 MG capsule, Take 2 capsules (200 mg total) by mouth at bedtime. (Patient not taking: Reported on 09/29/2023), Disp: 180 capsule, Rfl: 0   lisinopril  (ZESTRIL ) 10 MG tablet, Take 2 tablets (20 mg total) by mouth., Disp: , Rfl:   EXAM: BP Readings from Last 3 Encounters:  09/29/23 128/82  08/07/23 (!) 152/80  06/17/23 110/78    VITALS per patient if applicable:  GENERAL: alert, oriented, appears well and in no acute distress  HEENT: atraumatic, conjunttiva clear, no obvious abnormalities on inspection of external nose and ears  NECK: normal movements of the head and neck  LUNGS: on inspection no signs of respiratory distress, breathing rate appears normal, no obvious gross SOB, gasping or wheezing  CV: no obvious cyanosis  MS: moves all visible extremities without noticeable abnormality  PSYCH/NEURO: pleasant and cooperative, no obvious depression or anxiety, speech and thought processing grossly intact Lab Results  Component Value Date   WBC 10.9 (H) 04/13/2023   HGB  12.7 04/13/2023   HCT 39.1 04/13/2023   PLT 238.0 04/13/2023   GLUCOSE 94 11/25/2022   CHOL 156 11/25/2022   TRIG 88.0 11/25/2022   HDL 69.00 11/25/2022   LDLDIRECT 138.5 10/17/2013   LDLCALC 69 11/25/2022   ALT 25 11/25/2022   AST 27 11/25/2022   NA 140 11/25/2022   K 4.1 11/25/2022   CL 102 11/25/2022   CREATININE 0.91 11/25/2022   BUN 26 (H) 11/25/2022   CO2 30 11/25/2022   TSH 2.11 03/13/2022   HGBA1C 5.5 11/25/2022    ASSESSMENT AND PLAN:  Discussed the following assessment and plan:    ICD-10-CM   1. Dizzy sounds positional and peripheral  R42     2. Spinal stenosis of lumbar region at multiple levels  M48.061     3. Essential hypertension  I10    better on lisniopril 20 per day    4. Medication management  Z79.899     5. Pre-diabetes  R73.03     6. Hyperlipidemia, unspecified hyperlipidemia type  E78.5    trial off statin per cards    Sounds like positional dizziness  But co of leg weakness and hard to get up after bending over. Continue with PT that should be helpful and stay off the gabapentin  Put any weight loss med on hold for  now   indication and side effect . Update labs include chem  b12  thyroid  A1c etc   Counseled.   Expectant management and discussion of plan and treatment with opportunity to ask questions and all were answered. The patient agreed with the plan and demonstrated an understanding of the instructions. (see jan 21  visit about positional ) Advised to call back or seek an in-person evaluation if worsening  or having  further concerns  in interim. Return for when planned and lab pre visit .    Apolinar Eastern, MD

## 2023-09-29 NOTE — Progress Notes (Signed)
 Future labs  see notes and upcoming appt

## 2023-10-01 DIAGNOSIS — M5459 Other low back pain: Secondary | ICD-10-CM | POA: Diagnosis not present

## 2023-10-01 DIAGNOSIS — M6281 Muscle weakness (generalized): Secondary | ICD-10-CM | POA: Diagnosis not present

## 2023-10-01 DIAGNOSIS — M25552 Pain in left hip: Secondary | ICD-10-CM | POA: Diagnosis not present

## 2023-10-01 DIAGNOSIS — H52203 Unspecified astigmatism, bilateral: Secondary | ICD-10-CM | POA: Diagnosis not present

## 2023-10-01 DIAGNOSIS — R42 Dizziness and giddiness: Secondary | ICD-10-CM | POA: Diagnosis not present

## 2023-10-06 DIAGNOSIS — M6281 Muscle weakness (generalized): Secondary | ICD-10-CM | POA: Diagnosis not present

## 2023-10-06 DIAGNOSIS — R42 Dizziness and giddiness: Secondary | ICD-10-CM | POA: Diagnosis not present

## 2023-10-06 DIAGNOSIS — M5459 Other low back pain: Secondary | ICD-10-CM | POA: Diagnosis not present

## 2023-10-06 DIAGNOSIS — M25552 Pain in left hip: Secondary | ICD-10-CM | POA: Diagnosis not present

## 2023-10-13 ENCOUNTER — Ambulatory Visit: Payer: Medicare HMO | Admitting: Gastroenterology

## 2023-10-16 DIAGNOSIS — M48062 Spinal stenosis, lumbar region with neurogenic claudication: Secondary | ICD-10-CM | POA: Diagnosis not present

## 2023-10-21 ENCOUNTER — Ambulatory Visit: Payer: Self-pay | Admitting: Internal Medicine

## 2023-10-21 NOTE — Telephone Encounter (Signed)
  Chief Complaint: Weakness/flu-like symptoms Symptoms: Cough, nasal drainage, congestion, weakness, fatigue, body aches/chills, headaches Frequency: constant Pertinent Negatives: Patient denies n/v, productive cough, diarrhea, chest pain. Disposition: [] ED /[] Urgent Care (no appt availability in office) / [x] Appointment(In office/virtual)/ []  [facility]/ [] Home Care/ [] Refused Recommended Disposition /[] [facility]/ []  Follow-up with PCP Additional Notes: Patient called with complaints of weakness and flu-like symptoms. Patient states that the weakness and fatigue was the worse symptoms and she wanted something like Theraflu to help knock this out. Patient states that symptoms started 4 days ago after exposure to family members who has similar symptoms, with severe weakness about 2 days ago that is now slowly getting better. Patient states she is also having nasal drainage, congestion, headaches, and possible low grade fevers. Patient denies chest pain, n/v, productive cough, and diarrhea. Patient advised by this RN to be seen within 24 hrs per protocol to which patient was agreeable. Patient advised by this RN to call back with worsening symptoms. Patient verbalized understanding.  Copied from CRM [id number]. Topic: Clinical - Red Word Triage >> Oct 21, 2023 10:01 AM Laurier BROCKS wrote: Red Word that prompted transfer to Nurse Triage: Patient has been having flu like symptoms for the past 3 days. She has been feeling fatigued, body aches, chills, cough and congestion. Reason for Disposition  [1] MODERATE weakness (i.e., interferes with work, school, normal activities) AND [2] persists > 3 days  Answer Assessment - Initial Assessment Questions 1. DESCRIPTION: Describe how you are feeling.     I'm just weak. I have had flu-like symptoms for the last 4 days 2. SEVERITY: How bad is it?  Can you stand and walk?   - MILD (0-3): Feels weak or tired, but does not interfere with  work, school or normal activities.   - MODERATE (4-7): Able to stand and walk; weakness interferes with work, school, or normal activities.   - SEVERE (8-10): Unable to stand or walk; unable to do usual activities.     Moderate 3. ONSET: When did these symptoms begin? (e.g., hours, days, weeks, months)     4 days, as it's worse 2 days ago, getting better now. 4. CAUSE: What do you think is causing the weakness or fatigue? (e.g., not drinking enough fluids, medical problem, trouble sleeping)     I think I have the flu, it's going around. 5. NEW MEDICINES:  Have you started on any new medicines recently? (e.g., opioid pain medicines, benzodiazepines, muscle relaxants, antidepressants, antihistamines, neuroleptics, beta blockers)     N/A 6. OTHER SYMPTOMS: Do you have any other symptoms? (e.g., chest pain, fever, cough, SOB, vomiting, diarrhea, bleeding, other areas of pain)     Dry cough, congestion, I get SOB when Im walking I tire myself out but that's nothing unsual for me, I don't think I've had a fever, if I do it's like 99 or 100 something like that, muscle aches, nasal discharge, headache  Protocols used: Weakness (Generalized) and Fatigue-A-AH

## 2023-10-21 NOTE — Progress Notes (Signed)
 Chief Complaint  Patient presents with   Cough     Pt reports her sx started four days ago. Tried dylsum and zicam OTC. Pt states she is not coughing up any mucus. Did had sore throat prior. But has resolved.    Nasal Congestion   Chills   Headache   Generalized Body Aches   Fatigue    HPI: Shelley Wright 78 y.o. come in for  sending from nurse triage   for lfu like illness   Older relative  had  illness  high risk and then hsuband got sick .  And then she developed her sx  Onset with weakness. Day 1  now  tired hard to concentrate ur congestion and cough . No sob   ROS: See pertinent positives and negatives per HPI. Has had  vaccine and some boosters  no dx covid infection  Past Medical History:  Diagnosis Date   Anxiety    no per pt   Arthritis    Cataract    GERD (gastroesophageal reflux disease)    Heart murmur    'YEARS AGO   History of smoking    History of ulcerative colitis 2005   pancolitis    Hyperlipidemia    Hypertension    Normal nuclear stress test 2008   myoview    Ulcerative colitis (HCC)     Family History  Problem Relation Age of Onset   Pancreatic cancer Father        deceased   Hypertension Father    Breast cancer Mother 98   Angina Mother    Hypertension Mother    Colon cancer Neg Hx    Colon polyps Neg Hx    Rectal cancer Neg Hx    Stomach cancer Neg Hx     Social History   Socioeconomic History   Marital status: Married    Spouse name: Not on file   Number of children: 4   Years of education: Not on file   Highest education level: Not on file  Occupational History   Not on file  Tobacco Use   Smoking status: Former    Current packs/day: 0.00    Types: Cigarettes    Quit date: 07/04/2014    Years since quitting: 9.3   Smokeless tobacco: Never  Vaping Use   Vaping status: Never Used  Substance and Sexual Activity   Alcohol use: No    Alcohol/week: 0.0 standard drinks of alcohol   Drug use: No   Sexual activity: Not  on file  Other Topics Concern   Not on file  Social History Narrative   Hh  Of 2    Pet dog    Off tobacco.   [employer].     Teacher runs school.   8 - 3    G4 P4    Husband had cva and blocked carotids    Left handed   Drinks caffeine   Two story home   Social Drivers of Health   Financial Resource Strain: Low Risk  (09/30/2022)   Overall Financial Resource Strain (CARDIA)    Difficulty of Paying Living Expenses: Not hard at all  Food Insecurity: No Food Insecurity (09/30/2022)   Hunger Vital Sign    Worried About Running Out of Food in the Last Year: Never true    Ran Out of Food in the Last Year: Never true  Transportation Needs: No Transportation Needs (09/30/2022)   PRAPARE - Transportation  Lack of Transportation (Medical): No    Lack of Transportation (Non-Medical): No  Physical Activity: Inactive (09/30/2022)   Exercise Vital Sign    Days of Exercise per Week: 0 days    Minutes of Exercise per Session: 0 min  Stress: No Stress Concern Present (09/30/2022)   [employer] of Occupational Health - Occupational Stress Questionnaire    Feeling of Stress : Not at all  Social Connections: Socially Integrated (09/30/2022)   Social Connection and Isolation Panel [NHANES]    Frequency of Communication with Friends and Family: More than three times a week    Frequency of Social Gatherings with Friends and Family: More than three times a week    Attends Religious Services: More than 4 times per year    Active Member of [employer] or Organizations: Yes    Attends Engineer, Structural: More than 4 times per year    Marital Status: Married    Outpatient Medications Prior to Visit  Medication Sig Dispense Refill   escitalopram  (LEXAPRO ) 10 MG tablet TAKE 1 TABLET EVERY DAY 90 tablet 3   lisinopril  (ZESTRIL ) 10 MG tablet Take 2 tablets (20 mg total) by mouth.     pravastatin  (PRAVACHOL ) 40 MG tablet Take 1 tablet (40 mg total) by mouth every evening. 90 tablet 3    ezetimibe  (ZETIA ) 10 MG tablet Take 1 tablet (10 mg total) by mouth daily. (Patient not taking: Reported on 10/22/2023) 90 tablet 1   gabapentin  (NEURONTIN ) 100 MG capsule Take 2 capsules (200 mg total) by mouth at bedtime. (Patient not taking: Reported on 10/22/2023) 180 capsule 0   No facility-administered medications prior to visit.     EXAM:  BP 128/84 (BP Location: Left Arm, Patient Position: Sitting, Cuff Size: Normal)   Pulse (!) 101   Temp 98 F (36.7 C) (Oral)   Ht 5' 3 (1.6 m)   Wt 151 lb 12.8 oz (68.9 kg)   SpO2 96%   BMI 26.89 kg/m   Body mass index is 26.89 kg/m.  GENERAL: vitals reviewed and listed above, alert, oriented, appears well hydrated and in no acute distress non toxic but sick and congested  HEENT: atraumatic, conjunctiva  clear, no obvious abnormalities on inspection of external nose and ears masked  tms clear  NECK: no obvious masses on inspection palpation  LUNGS: clear to auscultation bilaterally, no wheezes, rales or rhonchi, good air movement CV: HRRR, no clubbing cyanosis or  peripheral edema nl cap refill  MS: moves all extremities without noticeable focal  abnormality PSYCH: pleasant and cooperative, no obvious depression or anxiety Lab Results  Component Value Date   WBC 10.9 (H) 04/13/2023   HGB 12.7 04/13/2023   HCT 39.1 04/13/2023   PLT 238.0 04/13/2023   GLUCOSE 94 11/25/2022   CHOL 156 11/25/2022   TRIG 88.0 11/25/2022   HDL 69.00 11/25/2022   LDLDIRECT 138.5 10/17/2013   LDLCALC 69 11/25/2022   ALT 25 11/25/2022   AST 27 11/25/2022   NA 140 11/25/2022   K 4.1 11/25/2022   CL 102 11/25/2022   CREATININE 0.91 11/25/2022   BUN 26 (H) 11/25/2022   CO2 30 11/25/2022   TSH 2.11 03/13/2022   HGBA1C 5.5 11/25/2022   BP Readings from Last 3 Encounters:  10/22/23 128/84  09/29/23 128/82  08/07/23 (!) 152/80   Covid screen pos  flu screen neg  ASSESSMENT AND PLAN:  Discussed the following assessment and plan:  COVID-19 virus  infection  Cough, unspecified type -  Plan: POC COVID-19, POC Influenza A/B  Chills - Plan: POC COVID-19, POC Influenza A/B  Other fatigue - Plan: POC COVID-19, POC Influenza A/B  Muscle ache - Plan: CK, VITAMIN D  25 Hydroxy (Vit-D Deficiency, Fractures), Vitamin B12, T4, free, TSH, Lipid panel, Hepatic function panel, Hemoglobin A1c, CBC with Differential/Platelet, Basic metabolic panel  Dizzy - Plan: CK, VITAMIN D  25 Hydroxy (Vit-D Deficiency, Fractures), Vitamin B12, T4, free, TSH, Lipid panel, Hepatic function panel, Hemoglobin A1c, CBC with Differential/Platelet, Basic metabolic panel  Spinal stenosis of lumbar region at multiple levels - Plan: CK, VITAMIN D  25 Hydroxy (Vit-D Deficiency, Fractures), Vitamin B12, T4, free, TSH, Lipid panel, Hepatic function panel, Hemoglobin A1c, CBC with Differential/Platelet, Basic metabolic panel  Medication management - Plan: CK, VITAMIN D  25 Hydroxy (Vit-D Deficiency, Fractures), Vitamin B12, T4, free, TSH, Lipid panel, Hepatic function panel, Hemoglobin A1c, CBC with Differential/Platelet, Basic metabolic panel  Pre-diabetes - Plan: CK, VITAMIN D  25 Hydroxy (Vit-D Deficiency, Fractures), Vitamin B12, T4, free, TSH, Lipid panel, Hepatic function panel, Hemoglobin A1c, CBC with Differential/Platelet, Basic metabolic panel  Hyperlipidemia, unspecified hyperlipidemia type - Plan: CK, VITAMIN D  25 Hydroxy (Vit-D Deficiency, Fractures), Vitamin B12, T4, free, TSH, Lipid panel, Hepatic function panel, Hemoglobin A1c, CBC with Differential/Platelet, Basic metabolic panel  Imbalance - Plan: CK, VITAMIN D  25 Hydroxy (Vit-D Deficiency, Fractures), Vitamin B12, T4, free, TSH, Lipid panel, Hepatic function panel, Hemoglobin A1c, CBC with Differential/Platelet, Basic metabolic panel   Covid infection seems uncomplicated  Risk benefit of medication discussed.  Day 5  rx and she can decide  risk factor is age .   Also she wanted to get her labs today as is fasting   even though sick    Ok to get these today and can interpret in context of illness  Contac team for alarm findings     Expectant management.  And comfort supportive care.  -Patient advised to return or notify health care team  if  new concerns arise.  Patient Instructions  Covid  19 infection  supportive  care  rest fluids.  Acts like flu with extra fatigue  and may last longer   Can chose to take antiviral  but should be started in first 5 days to help  to prevent complications.  Its option to take  since you have been immunized and generally are healthy .   If signs of pneumonia or dehydration  see medical care .        Ciearra Rufo K. Lennie Dunnigan M.D.

## 2023-10-22 ENCOUNTER — Encounter: Payer: Self-pay | Admitting: Internal Medicine

## 2023-10-22 ENCOUNTER — Ambulatory Visit (INDEPENDENT_AMBULATORY_CARE_PROVIDER_SITE_OTHER): Payer: Medicare Other | Admitting: Internal Medicine

## 2023-10-22 VITALS — BP 128/84 | HR 101 | Temp 98.0°F | Ht 63.0 in | Wt 151.8 lb

## 2023-10-22 DIAGNOSIS — R6883 Chills (without fever): Secondary | ICD-10-CM | POA: Diagnosis not present

## 2023-10-22 DIAGNOSIS — R2689 Other abnormalities of gait and mobility: Secondary | ICD-10-CM

## 2023-10-22 DIAGNOSIS — U071 COVID-19: Secondary | ICD-10-CM | POA: Diagnosis not present

## 2023-10-22 DIAGNOSIS — M48061 Spinal stenosis, lumbar region without neurogenic claudication: Secondary | ICD-10-CM

## 2023-10-22 DIAGNOSIS — R42 Dizziness and giddiness: Secondary | ICD-10-CM | POA: Diagnosis not present

## 2023-10-22 DIAGNOSIS — M791 Myalgia, unspecified site: Secondary | ICD-10-CM | POA: Diagnosis not present

## 2023-10-22 DIAGNOSIS — R5383 Other fatigue: Secondary | ICD-10-CM | POA: Diagnosis not present

## 2023-10-22 DIAGNOSIS — Z79899 Other long term (current) drug therapy: Secondary | ICD-10-CM

## 2023-10-22 DIAGNOSIS — E785 Hyperlipidemia, unspecified: Secondary | ICD-10-CM

## 2023-10-22 DIAGNOSIS — R7303 Prediabetes: Secondary | ICD-10-CM

## 2023-10-22 DIAGNOSIS — R059 Cough, unspecified: Secondary | ICD-10-CM | POA: Diagnosis not present

## 2023-10-22 LAB — HEPATIC FUNCTION PANEL
ALT: 22 U/L (ref 0–35)
AST: 28 U/L (ref 0–37)
Albumin: 4 g/dL (ref 3.5–5.2)
Alkaline Phosphatase: 44 U/L (ref 39–117)
Bilirubin, Direct: 0.1 mg/dL (ref 0.0–0.3)
Total Bilirubin: 0.6 mg/dL (ref 0.2–1.2)
Total Protein: 6.8 g/dL (ref 6.0–8.3)

## 2023-10-22 LAB — CBC WITH DIFFERENTIAL/PLATELET
Basophils Absolute: 0 10*3/uL (ref 0.0–0.1)
Basophils Relative: 0.5 % (ref 0.0–3.0)
Eosinophils Absolute: 0.2 10*3/uL (ref 0.0–0.7)
Eosinophils Relative: 3 % (ref 0.0–5.0)
HCT: 42 % (ref 36.0–46.0)
Hemoglobin: 13.6 g/dL (ref 12.0–15.0)
Lymphocytes Relative: 33.5 % (ref 12.0–46.0)
Lymphs Abs: 2.1 10*3/uL (ref 0.7–4.0)
MCHC: 32.5 g/dL (ref 30.0–36.0)
MCV: 97.7 fL (ref 78.0–100.0)
Monocytes Absolute: 0.7 10*3/uL (ref 0.1–1.0)
Monocytes Relative: 11.9 % (ref 3.0–12.0)
Neutro Abs: 3.1 10*3/uL (ref 1.4–7.7)
Neutrophils Relative %: 51.1 % (ref 43.0–77.0)
Platelets: 264 10*3/uL (ref 150.0–400.0)
RBC: 4.3 Mil/uL (ref 3.87–5.11)
RDW: 14.5 % (ref 11.5–15.5)
WBC: 6.2 10*3/uL (ref 4.0–10.5)

## 2023-10-22 LAB — LIPID PANEL
Cholesterol: 221 mg/dL — ABNORMAL HIGH (ref 0–200)
HDL: 56 mg/dL (ref >39.00)
LDL Cholesterol: 143 mg/dL — ABNORMAL HIGH (ref 0–99)
NonHDL: 165.29
Total CHOL/HDL Ratio: 4
Triglycerides: 113 mg/dL (ref 0.0–149.0)
VLDL: 22.6 mg/dL (ref 0.0–40.0)

## 2023-10-22 LAB — BASIC METABOLIC PANEL
BUN: 19 mg/dL (ref 6–23)
CO2: 26 meq/L (ref 19–32)
Calcium: 9.2 mg/dL (ref 8.4–10.5)
Chloride: 106 meq/L (ref 96–112)
Creatinine, Ser: 0.89 mg/dL (ref 0.40–1.20)
GFR: 63.14 mL/min (ref >60.00)
Glucose, Bld: 95 mg/dL (ref 70–99)
Potassium: 4 meq/L (ref 3.5–5.1)
Sodium: 141 meq/L (ref 135–145)

## 2023-10-22 LAB — VITAMIN D 25 HYDROXY (VIT D DEFICIENCY, FRACTURES): VITD: 19.35 ng/mL — ABNORMAL LOW (ref 30.00–100.00)

## 2023-10-22 LAB — HEMOGLOBIN A1C: Hgb A1c MFr Bld: 5.7 % (ref 4.6–6.5)

## 2023-10-22 LAB — CK: Total CK: 54 U/L (ref 7–177)

## 2023-10-22 LAB — VITAMIN B12: Vitamin B-12: >1537 pg/mL — ABNORMAL HIGH (ref 211–911)

## 2023-10-22 LAB — TSH: TSH: 1.5 u[IU]/mL (ref 0.35–5.50)

## 2023-10-22 LAB — POCT INFLUENZA A/B
Influenza A, POC: NEGATIVE
Influenza B, POC: NEGATIVE

## 2023-10-22 LAB — POC COVID19 BINAXNOW: SARS Coronavirus 2 Ag: POSITIVE — AB

## 2023-10-22 LAB — T4, FREE: Free T4: 0.7 ng/dL (ref 0.60–1.60)

## 2023-10-22 MED ORDER — NIRMATRELVIR/RITONAVIR (PAXLOVID)TABLET: 3.0000 | ORAL_TABLET | Freq: Two times a day (BID) | ORAL | 0 refills | Status: AC

## 2023-10-22 NOTE — Patient Instructions (Signed)
 Covid  19 infection  supportive  care  rest fluids.  Acts like flu with extra fatigue  and may last longer   Can chose to take antiviral  but should be started in first 5 days to help  to prevent complications.  Its option to take  since you have been immunized and generally are healthy .   If signs of pneumonia or dehydration  see medical care .

## 2023-10-27 ENCOUNTER — Encounter: Payer: Self-pay | Admitting: Internal Medicine

## 2023-10-27 ENCOUNTER — Other Ambulatory Visit: Payer: Medicare Other

## 2023-10-27 DIAGNOSIS — M5459 Other low back pain: Secondary | ICD-10-CM | POA: Diagnosis not present

## 2023-10-27 DIAGNOSIS — M25552 Pain in left hip: Secondary | ICD-10-CM | POA: Diagnosis not present

## 2023-10-27 DIAGNOSIS — R42 Dizziness and giddiness: Secondary | ICD-10-CM | POA: Diagnosis not present

## 2023-10-27 DIAGNOSIS — M6281 Muscle weakness (generalized): Secondary | ICD-10-CM | POA: Diagnosis not present

## 2023-10-27 NOTE — Progress Notes (Signed)
Results in range except vit d  low and b12 high  can review at next visit

## 2023-11-02 DIAGNOSIS — M5459 Other low back pain: Secondary | ICD-10-CM | POA: Diagnosis not present

## 2023-11-02 DIAGNOSIS — M25552 Pain in left hip: Secondary | ICD-10-CM | POA: Diagnosis not present

## 2023-11-02 DIAGNOSIS — R42 Dizziness and giddiness: Secondary | ICD-10-CM | POA: Diagnosis not present

## 2023-11-02 DIAGNOSIS — M6281 Muscle weakness (generalized): Secondary | ICD-10-CM | POA: Diagnosis not present

## 2023-11-04 ENCOUNTER — Other Ambulatory Visit: Payer: Self-pay | Admitting: Internal Medicine

## 2023-11-04 ENCOUNTER — Encounter: Payer: Medicare Other | Admitting: Internal Medicine

## 2023-11-04 DIAGNOSIS — I2510 Atherosclerotic heart disease of native coronary artery without angina pectoris: Secondary | ICD-10-CM

## 2023-11-04 NOTE — Telephone Encounter (Signed)
Last OV: 10/22/23 This RN contacted the pharmacy for refill confirmation. Per pharmacy, prevastatin 40mg , Lisinopril 10mg , and zetia 10mg  have refills remaining, neurontin 200mg  and Lezapro 10mg  have no refills remaining. This RN called patient to update. Patient verbalized understanding.

## 2023-11-04 NOTE — Telephone Encounter (Signed)
Copied from CRM [id number]. Topic: Clinical - Medication Refill >> Nov 04, 2023  2:05 PM Pascal Lux wrote: Most Recent Primary Care Visit:  Provider: Berniece Andreas K  Department: [facility]  Visit Type: ACUTE  Date: 10/22/2023  Medication: escitalopram (LEXAPRO) 10 MG tablet [[id number]] ezetimibe (ZETIA) 10 MG tablet [[id number]] gabapentin (NEURONTIN) 100 MG capsule [[id number]] lisinopril (ZESTRIL) 10 MG tablet [[id number]] pravastatin (PRAVACHOL) 40 MG tablet [[id number]]  Has the patient contacted their pharmacy? Yes (Agent: If no, request that the patient contact the pharmacy for the refill. If patient does not wish to contact the pharmacy document the reason why and proceed with request.) (Agent: If yes, when and what did the pharmacy advise?) they advised to contact provider  Is this the correct pharmacy for this prescription? Yes If no, delete pharmacy and type the correct one.  This is the patient's preferred pharmacy:  [id number] [place], [place] - [address] AT [facility] OF [facility] [address] & [facility] [address] [place] [address] Phone: [phone] Fax: [phone]   Has the prescription been filled recently? No  Is the patient out of the medication? No, just pravastatin (PRAVACHOL) 40 MG tablet [[id number]]  Has the patient been seen for an appointment in the last year OR does the patient have an upcoming appointment? Yes  Can we respond through MyChart? Yes  Agent: Please be advised that Rx refills may take up to 3 business days. We ask that you follow-up with your pharmacy.

## 2023-11-05 ENCOUNTER — Other Ambulatory Visit: Payer: Self-pay | Admitting: Internal Medicine

## 2023-11-05 DIAGNOSIS — I2510 Atherosclerotic heart disease of native coronary artery without angina pectoris: Secondary | ICD-10-CM

## 2023-11-05 NOTE — Telephone Encounter (Signed)
Copied from CRM [id number]. Topic: Clinical - Medication Refill >> Nov 05, 2023  1:08 PM [place] wrote: Most Recent Primary Care Visit:  Provider: Berniece Andreas K  Department: [facility]  Visit Type: ACUTE  Date: 10/22/2023  Medication: escitalopram (LEXAPRO) 10 MG tablet [[id number]] ezetimibe (ZETIA) 10 MG tablet [[id number]] gabapentin (NEURONTIN) 100 MG capsule [[id number]] lisinopril (ZESTRIL) 10 MG tablet [[id number]] pravastatin (PRAVACHOL) 40 MG tablet [[id number]]   Has the patient contacted their pharmacy? Yes (Agent: If no, request that the patient contact the pharmacy for the refill. If patient does not wish to contact the pharmacy document the reason why and proceed with request.) (Agent: If yes, when and what did the pharmacy advise?)  Is this the correct pharmacy for this prescription? Yes If no, delete pharmacy and type the correct one.  This is the patient's preferred pharmacy:  [id number] [place], [place] - [address] AT [facility] OF [facility] [address] & [facility] [address] [place] [address] Phone: [phone] Fax: [phone]   Has the prescription been filled recently? Yes  Is the patient out of the medication? Yes  Has the patient been seen for an appointment in the last year OR does the patient have an upcoming appointment? Yes  Can we respond through MyChart? Yes  Agent: Please be advised that Rx refills may take up to 3 business days. We ask that you follow-up with your pharmacy.

## 2023-11-09 MED ORDER — ESCITALOPRAM OXALATE 10 MG PO TABS: 10.0000 mg | ORAL_TABLET | Freq: Every day | ORAL | 3 refills | Status: DC

## 2023-11-09 MED ORDER — EZETIMIBE 10 MG PO TABS: 10.0000 mg | ORAL_TABLET | Freq: Every day | ORAL | 1 refills | Status: DC

## 2023-11-09 MED ORDER — LISINOPRIL 10 MG PO TABS: 20.0000 mg | ORAL_TABLET | Freq: Every day | ORAL | 1 refills | Status: DC

## 2023-11-09 MED ORDER — PRAVASTATIN SODIUM 40 MG PO TABS: 40.0000 mg | ORAL_TABLET | Freq: Every evening | ORAL | 3 refills | Status: DC

## 2023-11-09 MED ORDER — GABAPENTIN 100 MG PO CAPS: 200.0000 mg | ORAL_CAPSULE | Freq: Every day | ORAL | 0 refills | Status: DC

## 2023-11-23 DIAGNOSIS — M5459 Other low back pain: Secondary | ICD-10-CM | POA: Diagnosis not present

## 2023-11-23 DIAGNOSIS — R42 Dizziness and giddiness: Secondary | ICD-10-CM | POA: Diagnosis not present

## 2023-11-23 DIAGNOSIS — M6281 Muscle weakness (generalized): Secondary | ICD-10-CM | POA: Diagnosis not present

## 2023-11-23 DIAGNOSIS — M25552 Pain in left hip: Secondary | ICD-10-CM | POA: Diagnosis not present

## 2023-11-25 DIAGNOSIS — M48062 Spinal stenosis, lumbar region with neurogenic claudication: Secondary | ICD-10-CM | POA: Diagnosis not present

## 2023-12-01 DIAGNOSIS — M25552 Pain in left hip: Secondary | ICD-10-CM | POA: Diagnosis not present

## 2023-12-01 DIAGNOSIS — R42 Dizziness and giddiness: Secondary | ICD-10-CM | POA: Diagnosis not present

## 2023-12-01 DIAGNOSIS — M5459 Other low back pain: Secondary | ICD-10-CM | POA: Diagnosis not present

## 2023-12-01 DIAGNOSIS — M6281 Muscle weakness (generalized): Secondary | ICD-10-CM | POA: Diagnosis not present
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
## 2023-12-17 ENCOUNTER — Encounter: Payer: Self-pay | Admitting: Internal Medicine

## 2023-12-17 ENCOUNTER — Ambulatory Visit (INDEPENDENT_AMBULATORY_CARE_PROVIDER_SITE_OTHER): Payer: Medicare Other | Admitting: Internal Medicine

## 2023-12-17 VITALS — BP 116/70 | HR 80 | Temp 98.0°F | Ht 63.0 in | Wt 155.0 lb

## 2023-12-17 DIAGNOSIS — E559 Vitamin D deficiency, unspecified: Secondary | ICD-10-CM

## 2023-12-17 DIAGNOSIS — Z79899 Other long term (current) drug therapy: Secondary | ICD-10-CM | POA: Diagnosis not present

## 2023-12-17 DIAGNOSIS — R2689 Other abnormalities of gait and mobility: Secondary | ICD-10-CM

## 2023-12-17 DIAGNOSIS — Z8719 Personal history of other diseases of the digestive system: Secondary | ICD-10-CM

## 2023-12-17 DIAGNOSIS — M7072 Other bursitis of hip, left hip: Secondary | ICD-10-CM

## 2023-12-17 DIAGNOSIS — Z0000 Encounter for general adult medical examination without abnormal findings: Secondary | ICD-10-CM | POA: Diagnosis not present

## 2023-12-17 DIAGNOSIS — Z9181 History of falling: Secondary | ICD-10-CM

## 2023-12-17 NOTE — Patient Instructions (Signed)
 Gait disturbance  and falling   consider water therapy or  walking .  Plan vit d 3 2000 I u  per day   Check  lab in about 3 months   lipid, vit d vit b12 and any thing other I get on review .  Will do neuro   referral also about the imbalance and fall.

## 2023-12-20 ENCOUNTER — Other Ambulatory Visit: Payer: Self-pay | Admitting: Internal Medicine

## 2023-12-20 DIAGNOSIS — E559 Vitamin D deficiency, unspecified: Secondary | ICD-10-CM

## 2023-12-20 DIAGNOSIS — M7072 Other bursitis of hip, left hip: Secondary | ICD-10-CM

## 2023-12-20 DIAGNOSIS — E785 Hyperlipidemia, unspecified: Secondary | ICD-10-CM

## 2023-12-20 DIAGNOSIS — Z8719 Personal history of other diseases of the digestive system: Secondary | ICD-10-CM

## 2023-12-20 DIAGNOSIS — Z79899 Other long term (current) drug therapy: Secondary | ICD-10-CM

## 2023-12-20 DIAGNOSIS — R2689 Other abnormalities of gait and mobility: Secondary | ICD-10-CM

## 2023-12-20 DIAGNOSIS — Z9181 History of falling: Secondary | ICD-10-CM

## 2023-12-20 NOTE — Progress Notes (Signed)
 Future lab  after vit d supplant  Repeat b12   was not on supplement when high last time.  Cont pravastatin  add back the zetia

## 2023-12-21 DIAGNOSIS — M7062 Trochanteric bursitis, left hip: Secondary | ICD-10-CM | POA: Diagnosis not present

## 2023-12-21 DIAGNOSIS — M48062 Spinal stenosis, lumbar region with neurogenic claudication: Secondary | ICD-10-CM | POA: Diagnosis not present

## 2024-01-15 ENCOUNTER — Other Ambulatory Visit (INDEPENDENT_AMBULATORY_CARE_PROVIDER_SITE_OTHER)

## 2024-01-15 ENCOUNTER — Telehealth: Payer: Self-pay | Admitting: Internal Medicine

## 2024-01-15 DIAGNOSIS — Z8719 Personal history of other diseases of the digestive system: Secondary | ICD-10-CM

## 2024-01-15 DIAGNOSIS — E785 Hyperlipidemia, unspecified: Secondary | ICD-10-CM

## 2024-01-15 DIAGNOSIS — M7072 Other bursitis of hip, left hip: Secondary | ICD-10-CM

## 2024-01-15 DIAGNOSIS — Z79899 Other long term (current) drug therapy: Secondary | ICD-10-CM

## 2024-01-15 DIAGNOSIS — E559 Vitamin D deficiency, unspecified: Secondary | ICD-10-CM

## 2024-01-15 DIAGNOSIS — Z9181 History of falling: Secondary | ICD-10-CM | POA: Diagnosis not present

## 2024-01-15 DIAGNOSIS — R2689 Other abnormalities of gait and mobility: Secondary | ICD-10-CM

## 2024-01-15 LAB — CK: Total CK: 80 U/L (ref 7–177)

## 2024-01-15 LAB — LIPID PANEL
Cholesterol: 206 mg/dL — ABNORMAL HIGH (ref 0–200)
HDL: 65.9 mg/dL (ref >39.00)
LDL Cholesterol: 123 mg/dL — ABNORMAL HIGH (ref 0–99)
NonHDL: 140.33
Total CHOL/HDL Ratio: 3
Triglycerides: 85 mg/dL (ref 0.0–149.0)
VLDL: 17 mg/dL (ref 0.0–40.0)

## 2024-01-15 LAB — C-REACTIVE PROTEIN: CRP: <1 mg/dL (ref 0.5–20.0)

## 2024-01-15 LAB — VITAMIN D 25 HYDROXY (VIT D DEFICIENCY, FRACTURES): VITD: 29.67 ng/mL — ABNORMAL LOW (ref 30.00–100.00)

## 2024-01-15 LAB — VITAMIN B12: Vitamin B-12: 824 pg/mL (ref 211–911)

## 2024-01-15 NOTE — Telephone Encounter (Signed)
 Patient stated she has an appointment on May 7th and she would like to get a tdap shot with visit.

## 2024-01-15 NOTE — Telephone Encounter (Signed)
 Noted on the pt's appt.

## 2024-01-17 LAB — ANTI-NUCLEAR AB-TITER (ANA TITER)
ANA TITER: 1:80 {titer} — ABNORMAL HIGH
ANA Titer 1: 1:40 {titer} — ABNORMAL HIGH

## 2024-01-17 LAB — ANA: Anti Nuclear Antibody (ANA): POSITIVE — AB

## 2024-01-18 ENCOUNTER — Encounter: Payer: Self-pay | Admitting: Internal Medicine

## 2024-01-18 NOTE — Progress Notes (Signed)
 Inflammation markers are not elevated  ana is low level positive  and non diagnostic  of autoimmune disease but not rulling out.  B12 better level  Vit d  borderline low and would just increae vit d per day bty 1000  I U per day . Cholesterol ldl better than last time but still a bit up  the best reading  was a year ago

## 2024-01-20 ENCOUNTER — Ambulatory Visit: Payer: Self-pay

## 2024-01-20 ENCOUNTER — Telehealth: Payer: Self-pay | Admitting: Internal Medicine

## 2024-01-20 ENCOUNTER — Encounter: Payer: Medicare Other | Admitting: Internal Medicine

## 2024-01-20 ENCOUNTER — Ambulatory Visit: Payer: Medicare Other

## 2024-01-20 VITALS — BP 120/60 | HR 60 | Temp 98.4°F | Ht 63.0 in | Wt 156.4 lb

## 2024-01-20 DIAGNOSIS — Z0000 Encounter for general adult medical examination without abnormal findings: Secondary | ICD-10-CM | POA: Diagnosis not present

## 2024-01-20 NOTE — Telephone Encounter (Signed)
 Patient would like to know results from blood work. Would like results over the phone and needs to know if she needs to keep or cancel appt for May 13th

## 2024-01-20 NOTE — Telephone Encounter (Signed)
 Spoke to pt. Pt is keeping appt on 5/13 to go over lab result.

## 2024-01-20 NOTE — Telephone Encounter (Signed)
 Reviewed lab results with MD note. Patient had several questions about lab results that RN unable to answer effectively. Patient requested a video visit to speak with MD about questions RN was unable to answer. Video visit scheduled for 01/26/2024 at 1:30 PM.   Copied from CRM [id number]. Topic: Clinical - Lab/Test Results >> Jan 20, 2024  1:10 PM Shelley Wright wrote: Reason for CRM: Patient calling to discuss lab results from 01/15/24 Reason for Disposition  Caller requesting routine or non-urgent lab result  Answer Assessment - Initial Assessment Questions 1. REASON FOR CALL or QUESTION: "What is your reason for calling today?" or "How can I best help you?" or "What question do you have that I can help answer?"     Reviewed lab results with patient per MD note 2. CALLER: Document the source of call. (e.g., laboratory, patient).     patient  Protocols used: PCP Call - No Triage-A-AH

## 2024-01-20 NOTE — Progress Notes (Signed)
 Subjective:   Shelley Wright is a 78 y.o. who presents for a Medicare Wellness preventive visit.  Visit Complete: In person    Persons Participating in Visit: Patient.  AWV Questionnaire: No: Patient Medicare AWV questionnaire was not completed prior to this visit.  Cardiac Risk Factors include: advanced age (>39men, >62 women);hypertension     Objective:    Today's Vitals   01/20/24 1120  BP: 120/60  Pulse: 60  Temp: 98.4 F (36.9 C)  TempSrc: Oral  SpO2: 96%  Weight: 156 lb 6.4 oz (70.9 kg)  Height: 5\' 3"  (1.6 m)   Body mass index is 27.71 kg/m.     01/20/2024   11:36 AM 09/30/2022   10:40 AM 12/18/2021    2:09 PM 09/26/2021   10:43 AM 07/19/2021   12:42 PM 07/17/2020    3:27 PM 09/21/2015    1:29 PM  Advanced Directives  Does Patient Have a Medical Advance Directive? Yes Yes Yes Yes Yes Yes Yes  Type of Estate agent of [place];Living will Healthcare Power of [place];Living will Healthcare Power of [place];Living will Healthcare Power of [place];Living will  Healthcare Power of [place];Living will   Does patient want to make changes to medical advance directive?   No - Patient declined No - Patient declined     Copy of Healthcare Power of Attorney in Chart? No - copy requested No - copy requested No - copy requested No - copy requested  No - copy requested No - copy requested    Current Medications (verified) Outpatient Encounter Medications as of 01/20/2024  Medication Sig   escitalopram  (LEXAPRO ) 10 MG tablet Take 1 tablet (10 mg total) by mouth daily.   ezetimibe  (ZETIA ) 10 MG tablet Take 1 tablet (10 mg total) by mouth daily. (Patient not taking: Reported on 12/17/2023)   gabapentin  (NEURONTIN ) 100 MG capsule Take 2 capsules (200 mg total) by mouth at bedtime. (Patient not taking: Reported on 12/17/2023)   lisinopril  (ZESTRIL ) 10 MG tablet Take 2 tablets (20 mg total) by mouth daily.   pravastatin  (PRAVACHOL ) 40 MG tablet Take 1 tablet (40 mg  total) by mouth every evening.   No facility-administered encounter medications on file as of 01/20/2024.    Allergies (verified) Patient has no known allergies.   History: Past Medical History:  Diagnosis Date   Anxiety    no per pt   Arthritis    Cataract    GERD (gastroesophageal reflux disease)    Heart murmur    'YEARS AGO"   History of smoking    History of ulcerative colitis 2005   pancolitis    Hyperlipidemia    Hypertension    Normal nuclear stress test 2008   myoview    Ulcerative colitis (HCC)    Past Surgical History:  Procedure Laterality Date   BREAST BIOPSY Left 1986   biopsy x 2    COLONOSCOPY     KNEE SURGERY Left october   x 2   UPPER GASTROINTESTINAL ENDOSCOPY     Family History  Problem Relation Age of Onset   Pancreatic cancer Father        deceased   Hypertension Father    Breast cancer Mother 67   Angina Mother    Hypertension Mother    Colon cancer Neg Hx    Colon polyps Neg Hx    Rectal cancer Neg Hx    Stomach cancer Neg Hx    Social History   Socioeconomic History  Marital status: Married    Spouse name: Not on file   Number of children: 4   Years of education: Not on file   Highest education level: Not on file  Occupational History   Not on file  Tobacco Use   Smoking status: Former    Current packs/day: 0.00    Types: Cigarettes    Quit date: 07/04/2014    Years since quitting: 9.5   Smokeless tobacco: Never  Vaping Use   Vaping status: Never Used  Substance and Sexual Activity   Alcohol use: No    Alcohol/week: 0.0 standard drinks of alcohol   Drug use: No   Sexual activity: Not on file  Other Topics Concern   Not on file  Social History Narrative   Hh  Of 2    Pet dog    Off tobacco.   [employer].     Teacher runs school.   8 - 3    G4 P4    Husband had cva and blocked carotids    Left handed   Drinks caffeine   Two story home   Social Drivers of Health   Financial Resource Strain: Low Risk   (01/20/2024)   Overall Financial Resource Strain (CARDIA)    Difficulty of Paying Living Expenses: Not hard at all  Food Insecurity: No Food Insecurity (01/20/2024)   Hunger Vital Sign    Worried About Running Out of Food in the Last Year: Never true    Ran Out of Food in the Last Year: Never true  Transportation Needs: No Transportation Needs (01/20/2024)   PRAPARE - Administrator, Civil Service (Medical): No    Lack of Transportation (Non-Medical): No  Physical Activity: Inactive (01/20/2024)   Exercise Vital Sign    Days of Exercise per Week: 0 days    Minutes of Exercise per Session: 0 min  Stress: No Stress Concern Present (01/20/2024)   [employer] of Occupational Health - Occupational Stress Questionnaire    Feeling of Stress : Not at all  Social Connections: Socially Integrated (01/20/2024)   Social Connection and Isolation Panel [NHANES]    Frequency of Communication with Friends and Family: More than three times a week    Frequency of Social Gatherings with Friends and Family: More than three times a week    Attends Religious Services: More than 4 times per year    Active Member of [employer] or Organizations: Yes    Attends Engineer, structural: More than 4 times per year    Marital Status: Married    Tobacco Counseling Counseling given: Not Answered    Clinical Intake:  Pre-visit preparation completed: Yes  Pain : No/denies pain     BMI - recorded: 27.71 Nutritional Status: BMI 25 -29 Overweight Nutritional Risks: None Diabetes: No  Lab Results  Component Value Date   HGBA1C 5.7 10/22/2023   HGBA1C 5.5 11/25/2022   HGBA1C 5.9 03/13/2022     How often do you need to have someone help you when you read instructions, pamphlets, or other written materials from your doctor or pharmacy?: 1 - Never  Interpreter Needed?: No  Information entered by :: Farris Hong LPN   Activities of Daily Living     01/20/2024   11:34 AM  In your present  state of health, do you have any difficulty performing the following activities:  Hearing? 0  Vision? 0  Difficulty concentrating or making decisions? 0  Walking or climbing stairs?  0  Dressing or bathing? 0  Doing errands, shopping? 0  Preparing Food and eating ? N  Using the Toilet? N  In the past six months, have you accidently leaked urine? N  Do you have problems with loss of bowel control? N  Managing your Medications? N  Managing your Finances? N  Housekeeping or managing your Housekeeping? N    Patient Care Team: Panosh, Joaquim Muir, MD as PCP - General Sheryle Donning, MD as PCP - Cardiology (Cardiology) Nandigam, Kavitha V, MD as Consulting Physician (Gastroenterology) Avis Boehringer, MD as Consulting Physician (Dermatology)  Indicate any recent Medical Services you may have received from other than [facility] providers in the past year (date may be approximate).     Assessment:   This is a routine wellness examination for Natoria.  Hearing/Vision screen Hearing Screening - Comments:: Denies hearing difficulties   Vision Screening - Comments::  - up to date with routine eye exams with  Dr Terrall Ferraris   Goals Addressed               This Visit's Progress     Increase physical activity (pt-stated)        Lose weight       Depression Screen     01/20/2024   11:22 AM 12/17/2023   10:51 AM 12/04/2022   10:32 AM 09/30/2022   10:39 AM 03/10/2022    1:45 PM 09/26/2021   10:36 AM 07/17/2020    3:26 PM  PHQ 2/9 Scores  PHQ - 2 Score 0 0 0 0 1 0 0  PHQ- 9 Score 0 1   5      Fall Risk     01/20/2024   11:35 AM 12/17/2023   10:50 AM 09/30/2022   10:39 AM 03/10/2022    1:45 PM 09/26/2021   10:41 AM  Fall Risk   Falls in the past year? 1 1 0 0 0  Number falls in past yr: 0 0 0 0 0  Injury with Fall? 0 0 0 0 0  Risk for fall due to : No Fall Risks No Fall Risks No Fall Risks No Fall Risks   Follow up Falls prevention discussed;Falls evaluation completed Falls evaluation  completed Falls prevention discussed Falls prevention discussed     MEDICARE RISK AT HOME:  Medicare Risk at Home Any stairs in or around the home?: Yes If so, are there any without handrails?: No Home free of loose throw rugs in walkways, pet beds, electrical cords, etc?: Yes Adequate lighting in your home to reduce risk of falls?: Yes Life alert?: No Use of a cane, walker or w/c?: No Grab bars in the bathroom?: No Shower chair or bench in shower?: Yes Elevated toilet seat or a handicapped toilet?: No  TIMED UP AND GO:  Was the test performed?  Yes  Length of time to ambulate 10 feet: 10 sec Gait steady and fast without use of assistive device  Cognitive Function: 6CIT completed        01/20/2024   11:36 AM 09/30/2022   10:40 AM 09/26/2021   10:41 AM 07/17/2020    3:31 PM  6CIT Screen  What Year? 0 points 0 points 0 points 0 points  What month? 0 points 0 points 0 points 0 points  What time? 0 points 0 points    Count back from 20 0 points 0 points 0 points 0 points  Months in reverse 0 points 0 points 0 points 0 points  Repeat  phrase 0 points 0 points 0 points 0 points  Total Score 0 points 0 points      Immunizations Immunization History  Administered Date(s) Administered   Fluad Quad(high Dose 65+) 07/07/2022   Fluad Trivalent(High Dose 65+) 08/07/2023   Influenza Split 06/13/2011, 07/05/2012, 06/15/2013   Influenza, High Dose Seasonal PF 06/02/2017, 05/28/2019   Influenza,inj,Quad PF,6+ Mos 06/04/2018   Influenza-Unspecified 05/28/2019, 06/24/2020   PFIZER(Purple Top)SARS-COV-2 Vaccination 10/07/2019, 10/28/2019, 06/24/2020, 06/24/2020   Pfizer(Comirnaty)Fall Seasonal Vaccine 12 years and older 07/07/2022   Pneumococcal Conjugate-13 10/18/2013   Pneumococcal Polysaccharide-23 11/01/2014   Zoster Recombinant(Shingrix) 06/25/2021    Screening Tests Health Maintenance  Topic Date Due   COVID-19 Vaccine (6 - 2024-25 season) 05/17/2023   DTaP/Tdap/Td (1 - Tdap)  08/17/2024 (Originally 10/02/1964)   Zoster Vaccines- Shingrix (2 of 2) 08/17/2024 (Originally 08/20/2021)   INFLUENZA VACCINE  04/15/2024   Medicare Annual Wellness (AWV)  01/19/2025   Colonoscopy  06/07/2026   Pneumonia Vaccine 72+ Years old  Completed   DEXA SCAN  Completed   Hepatitis C Screening  Completed   HPV VACCINES  Aged Out   Meningococcal B Vaccine  Aged Out    Health Maintenance  Health Maintenance Due  Topic Date Due   COVID-19 Vaccine (6 - 2024-25 season) 05/17/2023   Health Maintenance Items Addressed:   Additional Screening:  Vision Screening: Recommended annual ophthalmology exams for early detection of glaucoma and other disorders of the eye.  Dental Screening: Recommended annual dental exams for proper oral hygiene  Community Resource Referral / Chronic Care Management: CRR required this visit?  No   CCM required this visit?  No     Plan:     I have personally reviewed and noted the following in the patient's chart:   Medical and social history Use of alcohol, tobacco or illicit drugs  Current medications and supplements including opioid prescriptions. Patient is not currently taking opioid prescriptions. Functional ability and status Nutritional status Physical activity Advanced directives List of other physicians Hospitalizations, surgeries, and ER visits in previous 12 months Vitals Screenings to include cognitive, depression, and falls Referrals and appointments  In addition, I have reviewed and discussed with patient certain preventive protocols, quality metrics, and best practice recommendations. A written personalized care plan for preventive services as well as general preventive health recommendations were provided to patient.     Dewayne Ford, LPN   4/0/1027   After Visit Summary: (In Person-Printed) AVS printed and given to the patient  Notes: Nothing significant to report at this time.

## 2024-01-20 NOTE — Patient Instructions (Addendum)
 Ms. Shelley Wright , Thank you for taking time to come for your Medicare Wellness Visit. I appreciate your ongoing commitment to your health goals. Please review the following plan we discussed and let me know if I can assist you in the future.   Referrals/Orders/Follow-Ups/Clinician Recommendations:   This is a list of the screening recommended for you and due dates:  Health Maintenance  Topic Date Due   COVID-19 Vaccine (6 - 2024-25 season) 05/17/2023   DTaP/Tdap/Td vaccine (1 - Tdap) 08/17/2024*   Zoster (Shingles) Vaccine (2 of 2) 08/17/2024*   Flu Shot  04/15/2024   Medicare Annual Wellness Visit  01/19/2025   Colon Cancer Screening  06/07/2026   Pneumonia Vaccine  Completed   DEXA scan (bone density measurement)  Completed   Hepatitis C Screening  Completed   HPV Vaccine  Aged Out   Meningitis B Vaccine  Aged Out  *Topic was postponed. The date shown is not the original due date.    Advanced directives: (Copy Requested) Please bring a copy of your health care power of attorney and living will to the office to be added to your chart at your convenience. You can mail to [facility] [address] [place], [place] [address] or email to [email or web address]  Next Medicare Annual Wellness Visit scheduled for next year: Yes

## 2024-01-21 NOTE — Telephone Encounter (Signed)
 Noted.

## 2024-01-26 ENCOUNTER — Encounter: Payer: Self-pay | Admitting: Internal Medicine

## 2024-01-26 ENCOUNTER — Telehealth (INDEPENDENT_AMBULATORY_CARE_PROVIDER_SITE_OTHER): Admitting: Internal Medicine

## 2024-01-26 DIAGNOSIS — R768 Other specified abnormal immunological findings in serum: Secondary | ICD-10-CM | POA: Diagnosis not present

## 2024-01-26 DIAGNOSIS — M48061 Spinal stenosis, lumbar region without neurogenic claudication: Secondary | ICD-10-CM | POA: Diagnosis not present

## 2024-01-26 DIAGNOSIS — R2689 Other abnormalities of gait and mobility: Secondary | ICD-10-CM

## 2024-01-26 NOTE — Progress Notes (Signed)
 Virtual Visit via Video Note  I connected with Shelley Wright on 01/26/24 at  1:30 PM EDT by a video enabled telemedicine application and verified that I am speaking with the correct person using two identifiers. Location patient: home Location provider:work office Persons participating in the virtual visit: patient, provider   Patient aware  of the limitations of evaluation and management by telemedicine and  availability of in person appointments. and agreed to proceed.   HPI: Shelley Wright presents for video visit  review lab   could abnormalities be related to her predicament  described as imbalance but  pain on upright walking   and continuing  Has had injection  ony hlp  for maybe 2 days?  Please review the pos ana . Doesn't have vertigo    upper cns sx .  ROS: See pertinent positives and negatives per HPI.  Past Medical History:  Diagnosis Date   Anxiety    no per pt   Arthritis    Cataract    GERD (gastroesophageal reflux disease)    Heart murmur    'YEARS AGO"   History of smoking    History of ulcerative colitis 2005   pancolitis    Hyperlipidemia    Hypertension    Normal nuclear stress test 2008   myoview    Ulcerative colitis (HCC)     Past Surgical History:  Procedure Laterality Date   BREAST BIOPSY Left 1986   biopsy x 2    COLONOSCOPY     KNEE SURGERY Left october   x 2   UPPER GASTROINTESTINAL ENDOSCOPY      Family History  Problem Relation Age of Onset   Pancreatic cancer Father        deceased   Hypertension Father    Breast cancer Mother 73   Angina Mother    Hypertension Mother    Colon cancer Neg Hx    Colon polyps Neg Hx    Rectal cancer Neg Hx    Stomach cancer Neg Hx     Social History   Tobacco Use   Smoking status: Former    Current packs/day: 0.00    Types: Cigarettes    Quit date: 07/04/2014    Years since quitting: 9.5   Smokeless tobacco: Never  Vaping Use   Vaping status: Never Used  Substance Use Topics    Alcohol use: No    Alcohol/week: 0.0 standard drinks of alcohol   Drug use: No      Current Outpatient Medications:    escitalopram  (LEXAPRO ) 10 MG tablet, Take 1 tablet (10 mg total) by mouth daily., Disp: 90 tablet, Rfl: 3   ezetimibe  (ZETIA ) 10 MG tablet, Take 1 tablet (10 mg total) by mouth daily., Disp: 90 tablet, Rfl: 1   lisinopril  (ZESTRIL ) 10 MG tablet, Take 2 tablets (20 mg total) by mouth daily., Disp: 180 tablet, Rfl: 1   pravastatin  (PRAVACHOL ) 40 MG tablet, Take 1 tablet (40 mg total) by mouth every evening., Disp: 90 tablet, Rfl: 3   gabapentin  (NEURONTIN ) 100 MG capsule, Take 2 capsules (200 mg total) by mouth at bedtime. (Patient not taking: Reported on 01/26/2024), Disp: 180 capsule, Rfl: 0  EXAM: BP Readings from Last 3 Encounters:  01/20/24 120/60  12/17/23 116/70  10/22/23 128/84    VITALS per patient if applicable:  GENERAL: alert, oriented, appears well and in no acute distress  HEENT: atraumatic, conjunttiva clear, no obvious abnormalities on inspection of external nose and ears  NECK: normal movements of the head and neck  LUNGS: on inspection no signs of respiratory distress, breathing rate appears normal, no obvious gross SOB, gasping or wheezing  CV: no obvious cyanosis  MS: moves all visible extremities without noticeable abnormality  PSYCH/NEURO: pleasant and cooperative, no obvious depression or anxiety, speech and thought processing grossly intact Lab Results  Component Value Date   WBC 6.2 10/22/2023   HGB 13.6 10/22/2023   HCT 42.0 10/22/2023   PLT 264.0 10/22/2023   GLUCOSE 95 10/22/2023   CHOL 206 (H) 01/15/2024   TRIG 85.0 01/15/2024   HDL 65.90 01/15/2024   LDLDIRECT 138.5 10/17/2013   LDLCALC 123 (H) 01/15/2024   ALT 22 10/22/2023   AST 28 10/22/2023   NA 141 10/22/2023   K 4.0 10/22/2023   CL 106 10/22/2023   CREATININE 0.89 10/22/2023   BUN 19 10/22/2023   CO2 26 10/22/2023   TSH 1.50 10/22/2023   HGBA1C 5.7 10/22/2023     ASSESSMENT AND PLAN:  Discussed the following assessment and plan:    ICD-10-CM   1. Imbalance  R26.89     2. Spinal stenosis of lumbar region at multiple levels  M48.061     3. Positive ANA (antinuclear antibody)  R76.8    low titer crp level normal    I think that most of problem is from her back pain spinal stenosis  and radiation but   need neuro opinion about  cause and clarity of problem .  Doubt if from rheumatologic disease with borderline elevated  ana .  Her quality of life is significantly effected at this time  Counseled.   Expectant management and discussion of plan and treatment with opportunity to ask questions and all were answered. The patient agreed with the plan and demonstrated an understanding of the instructions.   Advised to call back or seek an in-person evaluation if worsening  or having  further concerns  in interim. Return for when planned.   Daphine Eagle, MD

## 2024-01-26 NOTE — Patient Instructions (Signed)
 Keep neurology appt .  Consider second opinion if appropriate  Neurosurgery other .

## 2024-02-10 DIAGNOSIS — S93401A Sprain of unspecified ligament of right ankle, initial encounter: Secondary | ICD-10-CM | POA: Diagnosis not present

## 2024-02-10 DIAGNOSIS — M7062 Trochanteric bursitis, left hip: Secondary | ICD-10-CM | POA: Diagnosis not present

## 2024-02-16 ENCOUNTER — Ambulatory Visit: Admitting: Neurology

## 2024-02-19 DIAGNOSIS — M48062 Spinal stenosis, lumbar region with neurogenic claudication: Secondary | ICD-10-CM | POA: Diagnosis not present

## 2024-03-01 DIAGNOSIS — M48062 Spinal stenosis, lumbar region with neurogenic claudication: Secondary | ICD-10-CM | POA: Diagnosis not present

## 2024-03-29 DIAGNOSIS — M48062 Spinal stenosis, lumbar region with neurogenic claudication: Secondary | ICD-10-CM | POA: Diagnosis not present

## 2024-03-29 DIAGNOSIS — M7062 Trochanteric bursitis, left hip: Secondary | ICD-10-CM | POA: Diagnosis not present

## 2024-03-30 ENCOUNTER — Encounter: Payer: Self-pay | Admitting: Neurology

## 2024-03-30 ENCOUNTER — Ambulatory Visit: Admitting: Neurology

## 2024-03-30 VITALS — BP 126/67 | HR 85 | Ht 63.0 in | Wt 156.0 lb

## 2024-03-30 DIAGNOSIS — M4802 Spinal stenosis, cervical region: Secondary | ICD-10-CM | POA: Diagnosis not present

## 2024-03-30 DIAGNOSIS — R2681 Unsteadiness on feet: Secondary | ICD-10-CM | POA: Diagnosis not present

## 2024-03-30 DIAGNOSIS — M48061 Spinal stenosis, lumbar region without neurogenic claudication: Secondary | ICD-10-CM | POA: Diagnosis not present

## 2024-03-30 NOTE — Progress Notes (Unsigned)
 Follow-up Visit   Date: 03/30/2024    Shelley Wright MRN: [id number] DOB: 04-06-1946    Shelley Wright is a 78 y.o. left-handed Caucasian female with hypertension and hyperlipidemia returning to the clinic for follow-up of gait imbalance.  The patient was accompanied to the clinic by self.  IMPRESSION/PLAN: Multilevel cervical canal stenosis, severe at C4-5, C5-6, C6-7.  She was having right upper extremity paresthesias, which has since improved. Exam shows hyperreflexia in the upper and lower extremities. Tone is normal, no signs of spastic gait. Recommend follow-up with Dr. Dumonski for this.  Lumbar canal stenosis at L3-4 and L4-5, moderate-severe, with ongoing low back pain.  She has temporary relief with ESI.  Multifactorial gait instability due to left hip bursitis, possibly also related to her cervical and lumbar canal stenosis.  There is no evidence of neuropathy.  Fall precautions discussed.   Chronic pain syndrome with polyarthralgia  Return to clinic as needed  --------------------------------------------- UPDATE 03/30/2024:  She is referred to see me today for imbalance.  She reports having imbalance for several months and had one fall in December, where she fell into her rose bushes and was unable to stand up without assistance.  Fortunately, she did not have any significant injuries.  No falls this year.  She feels that her balance is not steady.  She has a lot of pain in the low back, left hip from bursititis, and achy feet pain  She sees Dr. Beuford for low back pain and has spinal stenosis at L3-4 and L4-5. She gets ESI which provides temporarily relief.    She is also being treated for left hip bursitis and getting injections.  She has tried PT, Horticulturist, commercial, Wellsite geologist, and Land.  She complains of achy pain in the feet and knees.  She denies numbness, tingling, or burning.  She walks unassisted.   Medications:  Current Outpatient Medications on File Prior to  Visit  Medication Sig Dispense Refill   escitalopram  (LEXAPRO ) 10 MG tablet Take 1 tablet (10 mg total) by mouth daily. 90 tablet 3   ezetimibe  (ZETIA ) 10 MG tablet Take 1 tablet (10 mg total) by mouth daily. 90 tablet 1   lisinopril  (ZESTRIL ) 10 MG tablet Take 2 tablets (20 mg total) by mouth daily. 180 tablet 1   pravastatin  (PRAVACHOL ) 40 MG tablet Take 1 tablet (40 mg total) by mouth every evening. 90 tablet 3   gabapentin  (NEURONTIN ) 100 MG capsule Take 2 capsules (200 mg total) by mouth at bedtime. (Patient not taking: Reported on 03/30/2024) 180 capsule 0   No current facility-administered medications on file prior to visit.    Allergies: No Known Allergies  Vital Signs:  BP 126/67   Pulse 85   Ht 5' 3 (1.6 m)   Wt 156 lb (70.8 kg)   SpO2 96%   BMI 27.63 kg/m   Neurological Exam: MENTAL STATUS including orientation to time, place, person, recent and remote memory, attention span and concentration, language, and fund of knowledge is normal.  Speech is not dysarthric.  CRANIAL NERVES:  Pupils equal round and reactive to light.  Normal conjugate, extra-ocular eye movements in all directions of gaze.  No ptosis.  Face is symmetric. Palate elevates symmetrically.  Tongue is midline.  MOTOR:  Motor strength is 5/5 in all extremities.  No atrophy, fasciculations or abnormal movements.  No pronator drift.  Tone is normal.    MSRs:  Reflexes are 3+/4 throughout, except 2+/4 at the ankles.  Bilateral hoffman's is present.  Medial pectoralis reflex spreads into finger flexors.  Crossed adductors are present bilaterally.   SENSORY:  Intact to vibration, temperature, and pin prick throughout. Shelley  Wright:  Normal finger-to- nose-finger.  Intact rapid alternating movements bilaterally.  Gait appears antalgic, favoring the right leg.     Data: MRI lumbar spine wo contrast 06/12/2023: Advanced lumbar disc and facet degeneration with moderate to severe spinal stenosis at L3-4 and  L4-5 and moderate spinal stenosis at L2-3.  MRI cervical spine wo contrast 07/07/2023: 1. C3-4: Uncovertebral prominence worse on the right. Facet osteoarthritis on the right. Right foraminal narrowing that could affect the right C4 nerve. 2. C4-5: Endplate osteophytes and bulging of the disc. Effacement of the ventral subarachnoid space but no compression of the cord. AP diameter of the canal in the midline 9.1 mm. Bilateral foraminal stenosis that could affect either C5 nerve. 3. C5-6: Endplate osteophytes and bulging of the disc, slightly more prominent towards the left. Effacement of the subarachnoid space but no compression of the cord. AP diameter of the canal in the midline 7.7 mm. Bilateral foraminal stenosis, left worse than right. Either C6 nerve could be affected, more likely the left. 4. C6-7: Endplate osteophytes and bulging of the disc. Effacement of the subarachnoid space. AP diameter of the canal in the midline 8.4 mm. Mild bilateral foraminal narrowing, mild foraminal narrowing on the right and moderate foraminal narrowing on the left. Some potential the left C7 nerve could be affected.   Total time spent reviewing records, interview, history/exam, documentation, and coordination of care on day of encounter:  30 minutes    Thank you for allowing me to participate in patient's care.  If I can answer any additional questions, I would be pleased to do so.    Sincerely,    Aalijah Mims K. Tobie, DO

## 2024-03-31 DIAGNOSIS — M170 Bilateral primary osteoarthritis of knee: Secondary | ICD-10-CM | POA: Diagnosis not present

## 2024-03-31 DIAGNOSIS — M1712 Unilateral primary osteoarthritis, left knee: Secondary | ICD-10-CM | POA: Diagnosis not present

## 2024-03-31 DIAGNOSIS — M1711 Unilateral primary osteoarthritis, right knee: Secondary | ICD-10-CM | POA: Diagnosis not present

## 2024-04-06 ENCOUNTER — Telehealth: Payer: Self-pay

## 2024-04-06 NOTE — Progress Notes (Signed)
   04/06/2024  Patient ID: Shelley Wright, female   DOB: April 03, 1946, 78 y.o.   MRN: [id number]  Pharmacy Quality Measure Review  This patient is appearing on a report for being at risk of failing the adherence measure for hypertension (ACEi/ARB) medications this calendar year.   Medication: Lisinopril  10mg  Last fill date: 11/09/23 for 90 day supply  Left voicemail for patient to return my call at their convenience.  Jon VEAR Lindau, PharmD Clinical Pharmacist [phone]

## 2024-04-11 ENCOUNTER — Ambulatory Visit: Admitting: Neurology

## 2024-05-09 ENCOUNTER — Ambulatory Visit: Payer: Self-pay | Admitting: *Deleted

## 2024-05-09 NOTE — Telephone Encounter (Signed)
 FYI Only or Action Required?: FYI only for provider.  Patient was last seen in primary care on 01/26/2024 by Panosh, Apolinar POUR, MD.  Called Nurse Triage reporting Mass.  Symptoms began today.  Interventions attempted: Nothing.  Symptoms are: gradually worsening.  Triage Disposition: See Physician Within 24 Hours  Patient/caregiver understands and will follow disposition?: Yes              Copied from CRM #[id number]. Topic: Clinical - Red Word Triage >> May 09, 2024  4:03 PM Gennette ORN wrote: Red Word that prompted transfer to Nurse Triage: Patient is calling because she has swollen lump on her neck. She just noticed it today. Reason for Disposition  [1] Swelling is painful to touch AND [2] no fever  Answer Assessment - Initial Assessment Questions Appt tomorrow. None available with PCP within 24 hours. Recommended if fever or worsening pain go to ED.     1. APPEARANCE of SWELLING: What does it look like?     Lump right side of neck  2. SIZE: How large is the swelling? (e.g., inches, cm; or compare to size of pinhead, tip of pen, eraser, coin, pea, grape, ping pong ball)      1/2 ping pong ball 3. LOCATION: Where is the swelling located?     Right neck  4. ONSET: When did the swelling start?     Noticed today  5. COLOR: What color is it? Is there more than one color?     No color change 6. PAIN: Is there any pain? If Yes, ask: How bad is the pain? (Scale 1-10; or mild, moderate, severe)       Tender to touch  7. ITCH: Does it itch? If Yes, ask: How bad is the itch?      na 8. CAUSE: What do you think caused the swelling?     Not sure  9 OTHER SYMPTOMS: Do you have any other symptoms? (e.g., fever)     No other sx reported.  Protocols used: Skin Lump or Localized Swelling-A-AH

## 2024-05-10 ENCOUNTER — Ambulatory Visit (INDEPENDENT_AMBULATORY_CARE_PROVIDER_SITE_OTHER): Admitting: Family Medicine

## 2024-05-10 ENCOUNTER — Encounter: Payer: Self-pay | Admitting: Family Medicine

## 2024-05-10 VITALS — BP 126/80 | HR 83 | Temp 97.4°F | Wt 160.0 lb

## 2024-05-10 DIAGNOSIS — K115 Sialolithiasis: Secondary | ICD-10-CM

## 2024-05-10 DIAGNOSIS — K080 Exfoliation of teeth due to systemic causes: Secondary | ICD-10-CM | POA: Diagnosis not present

## 2024-05-10 NOTE — Progress Notes (Signed)
   Subjective:    Patient ID: Shelley Wright, female    DOB: 08/10/1946, 78 y.o.   MRN: [id number]  HPI Here for a painful lump that suddenly appeared beneath her right jaw yesterday. It bothered her through the night, but this morning it suddenly felt better. She has only mild discomfort now. The swelling has come down as well. Otherwise she feels fine.    Review of Systems  Constitutional: Negative.   HENT:  Negative for congestion, ear pain, postnasal drip, sinus pressure, sore throat and trouble swallowing.   Eyes: Negative.   Respiratory: Negative.         Objective:   Physical Exam Constitutional:      Appearance: Normal appearance.  HENT:     Right Ear: Tympanic membrane, ear canal and external ear normal.     Left Ear: Tympanic membrane, ear canal and external ear normal.     Nose: Nose normal.     Mouth/Throat:     Pharynx: Oropharynx is clear.  Eyes:     Conjunctiva/sclera: Conjunctivae normal.  Neck:     Comments: There is a mildly swollen lump under the right mandibular angle that is mildly tender.  Pulmonary:     Effort: Pulmonary effort is normal.     Breath sounds: Normal breath sounds.  Neurological:     Mental Status: She is alert.           Assessment & Plan:  She likely had a salivary gland stone that has passed. She may apply warm compresses as needed. She should be back to normal in a day or two.  Garnette Olmsted, MD

## 2024-06-20 ENCOUNTER — Ambulatory Visit: Payer: Self-pay | Admitting: *Deleted

## 2024-06-20 ENCOUNTER — Ambulatory Visit: Payer: Self-pay

## 2024-06-20 NOTE — Telephone Encounter (Signed)
 FYI Only or Action Required?: FYI only for provider.  Patient was last seen in primary care on 05/10/2024 by [name] Garnette LABOR, MD.  Called Nurse Triage reporting Neck Pain.  Symptoms began a week ago.  Interventions attempted: OTC medications: Tylenol.  Symptoms are: unchanged.  Triage Disposition: See PCP When Office is Open (Within 3 Days)  Patient/caregiver understands and will follow disposition?: Yes   Copied from CRM #[id number]. Topic: Clinical - Red Word Triage >> Jun 20, 2024 10:45 AM Avram MATSU wrote: Red Word that prompted transfer to Nurse Triage: lump is gone but pt is still having pain in her neck when she's moving. Reason for Disposition  [1] MODERATE neck pain (e.g., interferes with normal activities) AND [2] present > 3 days  Answer Assessment - Initial Assessment Questions 1. ONSET: When did the pain begin?      1  week ago  2. LOCATION: Where does it hurt?      Whole neck on the front towards the right side  3. PATTERN Does the pain come and go, or has it been constant since it started?      Constant  4. SEVERITY: How bad is the pain?  (Scale 0-10; or none or slight stiffness, mild, moderate, severe)     6  5. RADIATION: Does the pain go anywhere else, shoot into your arms?     Not sure due to chronic pain  6. CORD SYMPTOMS: Any weakness or numbness of the arms or legs?     No  7. CAUSE: What do you think is causing the neck pain?     Unsure, maybe salivary gland stone had in the past  8. NECK OVERUSE: Any recent activities that involved turning or twisting the neck?     No  9. OTHER SYMPTOMS: Do you have any other symptoms? (e.g., headache, fever, chest pain, difficulty breathing, neck swelling)     No  Protocols used: Neck Pain or Stiffness-A-AH

## 2024-06-20 NOTE — Telephone Encounter (Signed)
 FYI Only or Action Required?: Action required by provider: referral request and clinical question for provider.  Patient was last seen in primary care on 05/10/2024 by Shelley Wright LABOR, MD.  Called Nurse Triage reporting Neck Pain.  Symptoms began a week ago.  Interventions attempted: OTC medications: motrin and tylenol.  Symptoms are: unchanged.  Triage Disposition: See PCP When Office is Open (Within 3 Days)  Patient/caregiver understands and will follow disposition?: No, wishes to speak with PCP             Copied from CRM #[id number]. Topic: Clinical - Red Word Triage >> Jun 20, 2024 12:11 PM Frederich PARAS wrote: [facility] that prompted transfer to Nurse Triage: neck pain  Pt has neck pain. Reason for Disposition  [1] MODERATE neck pain (e.g., interferes with normal activities) AND [2] present > 3 days  Answer Assessment - Initial Assessment Questions Patient canceled appt for 06/22/24 due to not sure if PCP can do anything further for her. Saw Dr. [name] and sx better for neck lump but stiffness remains and pain. Taking motrin and tylenol . Requesting if a referral is the best option. Please advise .     1. ONSET: When did the pain begin?      1 week ago  2. LOCATION: Where does it hurt?      All over body. Neck pain  3. PATTERN Does the pain come and go, or has it been constant since it started?      Constant  4. SEVERITY: How bad is the pain?  (Scale 0-10; or none or slight stiffness, mild, moderate, severe)     Stiffness  5. RADIATION: Does the pain go anywhere else, shoot into your arms?     No  6. CORD SYMPTOMS: Any weakness or numbness of the arms or legs?     no 7. CAUSE: What do you think is causing the neck pain?     Not sure  8. NECK OVERUSE: Any recent activities that involved turning or twisting the neck?     No  9. OTHER SYMPTOMS: Do you have any other symptoms? (e.g., headache, fever, chest pain, difficulty breathing, neck swelling)      Neck pain both sides , no swelling. Reports left side worse than right side. 10. PREGNANCY: Is there any chance you are pregnant? When was your last menstrual period?       na  Protocols used: Neck Pain or Stiffness-A-AH

## 2024-06-21 ENCOUNTER — Telehealth: Admitting: Internal Medicine

## 2024-06-21 ENCOUNTER — Encounter: Payer: Self-pay | Admitting: Internal Medicine

## 2024-06-21 VITALS — Ht 63.0 in | Wt 150.0 lb

## 2024-06-21 DIAGNOSIS — M542 Cervicalgia: Secondary | ICD-10-CM

## 2024-06-21 DIAGNOSIS — Z1231 Encounter for screening mammogram for malignant neoplasm of breast: Secondary | ICD-10-CM | POA: Diagnosis not present

## 2024-06-21 LAB — HM MAMMOGRAPHY

## 2024-06-21 NOTE — Progress Notes (Signed)
 Virtual Visit via Video Note  I connected with Shelley Wright on 06/21/24 at  3:00 PM EDT by a video enabled telemedicine application and verified that I am speaking with the correct person using two identifiers. Location patient: home Location provider:work office Persons participating in the virtual visit: patient, provider   Patient aware  of the limitations of evaluation and management by telemedicine and  availability of in person appointments. and agreed to proceed.   HPI: Shelley Wright presents for video visit regarding new symptoms of neck pain mostly on the right recently began.  No associated fever sore throat injury previous.  See past  notes   Dr [name] had seen Dr. [name] with a enlarged salivary gland that quickly resolved and was not felt to need further follow-up.  She has a follow-up this week with pain rehab medicine for injections to try to help her spinal stenosis and radiating hip pain.  Injections have not really helped that much in the past except for a couple weeks.  Has a history of headaches but that is separate then symptoms today.  No numbness weakness in her upper extremities.      ROS: See pertinent positives and negatives per HPI.  Past Medical History:  Diagnosis Date   Anxiety    no per pt   Arthritis    Cataract    GERD (gastroesophageal reflux disease)    Heart murmur    'YEARS AGO   History of smoking    History of ulcerative colitis 2005   pancolitis    Hyperlipidemia    Hypertension    Normal nuclear stress test 2008   myoview    Ulcerative colitis (HCC)     Past Surgical History:  Procedure Laterality Date   BREAST BIOPSY Left 1986   biopsy x 2    COLONOSCOPY     KNEE SURGERY Left october   x 2   UPPER GASTROINTESTINAL ENDOSCOPY      Family History  Problem Relation Age of Onset   Pancreatic cancer Father        deceased   Hypertension Father    Breast cancer Mother 22   Angina Mother    Hypertension Mother    Colon  cancer Neg Hx    Colon polyps Neg Hx    Rectal cancer Neg Hx    Stomach cancer Neg Hx     Social History   Tobacco Use   Smoking status: Former    Current packs/day: 0.00    Types: Cigarettes    Quit date: 07/04/2014    Years since quitting: 9.9   Smokeless tobacco: Never  Vaping Use   Vaping status: Never Used  Substance Use Topics   Alcohol use: No    Alcohol/week: 0.0 standard drinks of alcohol   Drug use: No      Current Outpatient Medications:    escitalopram  (LEXAPRO ) 10 MG tablet, Take 1 tablet (10 mg total) by mouth daily., Disp: 90 tablet, Rfl: 3   ezetimibe  (ZETIA ) 10 MG tablet, Take 1 tablet (10 mg total) by mouth daily., Disp: 90 tablet, Rfl: 1   lisinopril  (ZESTRIL ) 10 MG tablet, Take 2 tablets (20 mg total) by mouth daily. (Patient taking differently: Take 10 mg by mouth daily.), Disp: 180 tablet, Rfl: 1   pravastatin  (PRAVACHOL ) 40 MG tablet, Take 1 tablet (40 mg total) by mouth every evening., Disp: 90 tablet, Rfl: 3   TURMERIC PO, Take by mouth daily., Disp: , Rfl:  VITAMIN D  PO, Take by mouth daily., Disp: , Rfl:    gabapentin  (NEURONTIN ) 100 MG capsule, Take 2 capsules (200 mg total) by mouth at bedtime. (Patient not taking: Reported on 06/21/2024), Disp: 180 capsule, Rfl: 0  EXAM: BP Readings from Last 3 Encounters:  05/10/24 126/80  03/30/24 126/67  01/20/24 120/60    VITALS per patient if applicable:  GENERAL: alert, oriented, appears well and in no acute distress non toxic   HEENT: atraumatic, conjunttiva clear, no obvious abnormalities on inspection of external nose and ears  NECK: Points to right lateral neck and medial trapezius area of area of discomfort and denies any masses in the anterior neck range of motion stiff and discomfort to the right and stretch  LUNGS: on inspection no signs of respiratory distress, breathing rate appears normal, no obvious gross SOB, gasping or wheezing  CV: no obvious cyanosis  PSYCH/NEURO: pleasant and  cooperative, no obvious depression or anxiety, speech and thought processing grossly intact Lab Results  Component Value Date   WBC 6.2 10/22/2023   HGB 13.6 10/22/2023   HCT 42.0 10/22/2023   PLT 264.0 10/22/2023   GLUCOSE 95 10/22/2023   CHOL 206 (H) 01/15/2024   TRIG 85.0 01/15/2024   HDL 65.90 01/15/2024   LDLDIRECT 138.5 10/17/2013   LDLCALC 123 (H) 01/15/2024   ALT 22 10/22/2023   AST 28 10/22/2023   NA 141 10/22/2023   K 4.0 10/22/2023   CL 106 10/22/2023   CREATININE 0.89 10/22/2023   BUN 19 10/22/2023   CO2 26 10/22/2023   TSH 1.50 10/22/2023   HGBA1C 5.7 10/22/2023  Mri of back spinal stenosis degenerative disease   ASSESSMENT AND PLAN:  Discussed the following assessment and plan:    ICD-10-CM   1. Neck pain  M54.2      Appears to be musculoskeletal and not alarming not associated with trauma not midline or neurologic symptoms.  Does not seem to be related to her episode of salivary gland enlargement based on location and context. However if symptoms recur we may get ENT to see her. In the interim if appropriate and continues consider neck x-ray physical therapy further evaluation she may ask her pain management team about this. Will follow expectant management. Counseled.  he patient agreed with the plan and demonstrated an understanding of the instructions.   Advised to call back or seek an in-person evaluation if worsening  or having  further concerns  in interim. No follow-ups on file.    Apolinar Eastern, MD

## 2024-06-21 NOTE — Telephone Encounter (Signed)
 Looks like pt was scheduled with Dr. Charlett for tomorrow but canceled appt.   Follow up with pt. Pt states she is not sure what Dr. Charlett can do for her that's why appt was cancelled. Pt wants to know if a referral can be placed. Pt states she wants to talk to Dr. Charlett. Offer an appt virtual or in person. Pt states she can talk with Dr. Charlett via virtual appt.   Forwarding to provider for fyi.

## 2024-06-21 NOTE — Patient Instructions (Addendum)
 If  persistent or progressive or  mass recurs plan a fu  can send message about evaluation.

## 2024-06-22 ENCOUNTER — Ambulatory Visit: Admitting: Internal Medicine

## 2024-06-22 ENCOUNTER — Encounter: Payer: Self-pay | Admitting: Internal Medicine

## 2024-06-24 DIAGNOSIS — M7062 Trochanteric bursitis, left hip: Secondary | ICD-10-CM | POA: Diagnosis not present

## 2024-06-24 DIAGNOSIS — M48062 Spinal stenosis, lumbar region with neurogenic claudication: Secondary | ICD-10-CM | POA: Diagnosis not present

## 2024-07-12 DIAGNOSIS — M48062 Spinal stenosis, lumbar region with neurogenic claudication: Secondary | ICD-10-CM | POA: Diagnosis not present

## 2024-07-26 ENCOUNTER — Ambulatory Visit: Admitting: Neurology

## 2024-07-29 DIAGNOSIS — M170 Bilateral primary osteoarthritis of knee: Secondary | ICD-10-CM | POA: Diagnosis not present

## 2024-07-29 DIAGNOSIS — M48062 Spinal stenosis, lumbar region with neurogenic claudication: Secondary | ICD-10-CM | POA: Diagnosis not present

## 2024-07-29 DIAGNOSIS — M7062 Trochanteric bursitis, left hip: Secondary | ICD-10-CM | POA: Diagnosis not present

## 2024-07-29 DIAGNOSIS — M1712 Unilateral primary osteoarthritis, left knee: Secondary | ICD-10-CM | POA: Diagnosis not present

## 2024-08-25 ENCOUNTER — Ambulatory Visit: Payer: Self-pay

## 2024-08-25 NOTE — Telephone Encounter (Signed)
 FYI Only or Action Required?: FYI only for provider: appointment scheduled on 08/29/24.  Patient was last seen in primary care on 06/21/2024 by Panosh, Apolinar POUR, MD.  Called Nurse Triage reporting Neck Pain.  Symptoms began about a month ago.  Interventions attempted: OTC medications: Ibuprofen and Rest, hydration, or home remedies.  Symptoms are: stable.  Triage Disposition: See PCP When Office is Open (Within 3 Days)  Patient/caregiver understands and will follow disposition?: Yes Reason for Disposition  Neck pain present > 2 weeks  Answer Assessment - Initial Assessment Questions Patient has appointment with back surgeon on 12/22. Patient has taken Ibuprofen, little to no relief.   1. ONSET: When did the pain begin?      A week ago  2. LOCATION: Where does it hurt?      Right side, kind of near where the sialolithiasis   3. PATTERN Does the pain come and go, or has it been constant since it started?      Constant   4. SEVERITY: How bad is the pain?  (Scale 0-10; or none or slight stiffness, mild, moderate, severe)     8/10  5. RADIATION: Does the pain go anywhere else, shoot into your arms?     Denies  6. CORD SYMPTOMS: Any weakness or numbness of the arms or legs?     Denies  7. CAUSE: What do you think is causing the neck pain?     Unsure  8. NECK OVERUSE: Any recent activities that involved turning or twisting the neck?     Denies  9. OTHER SYMPTOMS: Do you have any other symptoms? (e.g., headache, fever, chest pain, difficulty breathing, neck swelling)     Denies  Protocols used: Neck Pain or Stiffness-A-AH  Copied from CRM #[id number]. Topic: Clinical - Red Word Triage >> Aug 25, 2024 10:03 AM Rosina BIRCH wrote: Red Word that prompted transfer to Nurse Triage: patient called stating she saw MD [name] for a lump in her neck on 8/26. Patient stated now she is having pain in her neck and can hardly move it

## 2024-08-29 ENCOUNTER — Encounter: Payer: Self-pay | Admitting: Family Medicine

## 2024-08-29 ENCOUNTER — Ambulatory Visit: Admitting: Family Medicine

## 2024-08-29 VITALS — BP 130/72 | HR 91 | Temp 100.2°F | Ht 63.0 in | Wt 155.0 lb

## 2024-08-29 DIAGNOSIS — M542 Cervicalgia: Secondary | ICD-10-CM | POA: Diagnosis not present

## 2024-08-29 LAB — CBC WITH DIFFERENTIAL/PLATELET
Basophils Absolute: 0.1 K/uL (ref 0.0–0.1)
Basophils Relative: 0.9 % (ref 0.0–3.0)
Eosinophils Absolute: 0.3 K/uL (ref 0.0–0.7)
Eosinophils Relative: 3.6 % (ref 0.0–5.0)
HCT: 39.9 % (ref 36.0–46.0)
Hemoglobin: 13.1 g/dL (ref 12.0–15.0)
Lymphocytes Relative: 29.1 % (ref 12.0–46.0)
Lymphs Abs: 2.2 K/uL (ref 0.7–4.0)
MCHC: 33 g/dL (ref 30.0–36.0)
MCV: 95.6 fl (ref 78.0–100.0)
Monocytes Absolute: 0.6 K/uL (ref 0.1–1.0)
Monocytes Relative: 8.1 % (ref 3.0–12.0)
Neutro Abs: 4.4 K/uL (ref 1.4–7.7)
Neutrophils Relative %: 58.3 % (ref 43.0–77.0)
Platelets: 267 K/uL (ref 150.0–400.0)
RBC: 4.17 Mil/uL (ref 3.87–5.11)
RDW: 15.4 % (ref 11.5–15.5)
WBC: 7.6 K/uL (ref 4.0–10.5)

## 2024-08-29 LAB — BASIC METABOLIC PANEL WITH GFR
BUN: 18 mg/dL (ref 6–23)
CO2: 31 meq/L (ref 19–32)
Calcium: 9.9 mg/dL (ref 8.4–10.5)
Chloride: 106 meq/L (ref 96–112)
Creatinine, Ser: 0.76 mg/dL (ref 0.40–1.20)
GFR: 75.86 mL/min (ref >60.00)
Glucose, Bld: 90 mg/dL (ref 70–99)
Potassium: 4.1 meq/L (ref 3.5–5.1)
Sodium: 142 meq/L (ref 135–145)

## 2024-08-29 NOTE — Addendum Note (Signed)
 Addended by: [name] SENIOR A on: 08/29/2024 10:32 AM   Modules accepted: Orders

## 2024-08-29 NOTE — Progress Notes (Signed)
° °  Subjective:    Patient ID: Shelley Wright, female    DOB: Oct 23, 1945, 78 y.o.   MRN: [id number]  HPI Here for recurrent pain in the right neck area. We saw her in August for the sudden onset of pain and swelling in the right anterior neck, and we felt this was due to sialolithiasis. This resolved quickly on its own, and she felt fine until 2 weeks ago. Now for the past 2 weeks she has felt a sharp pain in the exact some area in her neck. No swelling. This mostly bothers her when she turns her head to the right. No trouble swallowing. She has been taking Ibuprofen and Tylenol for this off and on. No fevers.    Review of Systems  Constitutional: Negative.   HENT: Negative.    Eyes: Negative.   Respiratory: Negative.    Musculoskeletal:  Positive for neck pain.       Objective:   Physical Exam Constitutional:      Appearance: Normal appearance.  HENT:     Right Ear: Tympanic membrane, ear canal and external ear normal.     Left Ear: Tympanic membrane, ear canal and external ear normal.     Nose: Nose normal.     Mouth/Throat:     Pharynx: Oropharynx is clear.  Neck:     Comments: She is tender in the right anterior neck just above the thyroid . No masses or swelling. She has pain on turning her head to the right but not to the left  Pulmonary:     Effort: Pulmonary effort is normal.  Musculoskeletal:     Cervical back: Neck supple.  Lymphadenopathy:     Cervical: No cervical adenopathy.  Neurological:     Mental Status: She is alert.           Assessment & Plan:  Recurrent pain in the right neck of uncertain etiology. We will set up a contrasted CT of the neck to evaluate this further. Garnette Olmsted, MD

## 2024-08-30 ENCOUNTER — Ambulatory Visit: Payer: Self-pay | Admitting: Family Medicine

## 2024-09-01 ENCOUNTER — Inpatient Hospital Stay: Admission: RE | Admit: 2024-09-01 | Discharge: 2024-09-01 | Attending: Family Medicine | Admitting: Family Medicine

## 2024-09-01 DIAGNOSIS — M542 Cervicalgia: Secondary | ICD-10-CM

## 2024-09-01 MED ORDER — IOPAMIDOL (ISOVUE-370) INJECTION 76%
70.0000 mL | Freq: Once | INTRAVENOUS | Status: AC | PRN
  Administered 2024-09-01: 13:00:00 70 mL via INTRAVENOUS

## 2024-09-02 ENCOUNTER — Telehealth: Payer: Self-pay

## 2024-09-02 NOTE — Telephone Encounter (Signed)
 Copied from CRM #[id number]. Topic: Clinical - Lab/Test Results >> Sep 02, 2024  2:13 PM Berneda FALCON wrote: Reason for CRM: Patient would like a callback to review labs and CAT Scan results please.  Patient callback is [phone] (home)

## 2024-09-05 ENCOUNTER — Telehealth: Payer: Self-pay | Admitting: *Deleted

## 2024-09-05 NOTE — Telephone Encounter (Signed)
 Copied from CRM #[id number]. Topic: Clinical - Lab/Test Results >> Sep 05, 2024 11:19 AM Terri MATSU wrote: Reason for CRM: Patient calling for cat scan results. Advised her results are not ready yet and they will call once Dr.Fry had a chance to look at it.

## 2024-09-05 NOTE — Telephone Encounter (Signed)
 CT order by Dr. [name] is not back. Pt was made aware. Please see other encounter.

## 2024-09-05 NOTE — Telephone Encounter (Signed)
 Noted

## 2024-09-07 ENCOUNTER — Ambulatory Visit: Payer: Self-pay

## 2024-09-07 ENCOUNTER — Other Ambulatory Visit: Payer: Self-pay

## 2024-09-07 ENCOUNTER — Telehealth: Payer: Self-pay

## 2024-09-07 MED ORDER — BACLOFEN 10 MG PO TABS: 10.0000 mg | ORAL_TABLET | Freq: Three times a day (TID) | ORAL | 0 refills | Status: DC

## 2024-09-07 NOTE — Telephone Encounter (Signed)
 Ct results show no inflammation infection or significant masses .  But evidence that may have had parotid or salivary gland inflammation in past .   Also there is significant  vascular narrowing   on some of the blood vessels  see below but doesn't seem to be related to your symptoms .  You also have spine arthritis    noted    IMPRESSION: 1. No acute or inflammatory process identified in the Neck. No neck mass or lymphadenopathy. Partial atrophy of both parotid glands suggests previous salivary inflammation. 2. Severe/critical atherosclerotic stenosis of the proximal left subclavian artery, remains patent. 3. Emphysema.  Advanced cervical spine degeneration.    I suggest seeing  vascular  referral  ( although may not be causing sx )  And may be ent  for further advice   . Cholesterol lowering should be optimized ) Please make referrals if she agrees  Otherwise make a fy appt visit virtual ok

## 2024-09-07 NOTE — Telephone Encounter (Signed)
 Copied from CRM #[id number]. Topic: Clinical - Lab/Test Results >> Sep 07, 2024 10:09 AM Charolett L wrote: Reason for CRM: Patient called in for CT scan results and as per CAL Dr. [name] hasn't seen them as of yet and works a half a day and will try to get to them. Patient is also requesting pain medication

## 2024-09-07 NOTE — Telephone Encounter (Signed)
 FYI Only or Action Required?: Action required by provider: clinical question for provider and patient requesting to go over CT scan results .  Patient was last seen in primary care on 08/29/2024 by [name] Garnette LABOR, MD.  Called Nurse Triage reporting Results.  Symptoms began ongoing neck pain seen 12/15 no changes in symptoms since visit .  Interventions attempted: Nothing.  Symptoms are: stable.  Triage Disposition: Call PCP When Office is Open  Patient/caregiver understands and will follow disposition?: Yes            Reason for Disposition  [1] Caller requesting NON-URGENT health information AND [2] PCP's office is the best resource  Answer Assessment - Initial Assessment Questions 1. REASON FOR CALL: What is the main reason for your call? or How can I best help you?     Received CT scan results in mychart and has questions and wants to go over them does not understand the results. Patient was seen in office 12/15 for ongoing neck pain and CT scan was ordered . Results are still pending review/sign off from provider. Advised patient at this time not able to review results until provider has signed off and provided impression of results. Patient verbalized understanding and is hoping for a call back top review before office closes today  2. SYMPTOMS : Do you have any symptoms?      Denies any new symptoms report has ongoing neck pain what seen in office for and there has been no new or worsening symptoms or any changes since appointment  3. OTHER QUESTIONS: Do you have any other questions?     No - only question is wanting to review CT scan results.  Protocols used: Information Only Call - No Triage-A-AH Copied from CRM [id number]. Topic: Clinical - Lab/Test Results >> Sep 07, 2024 11:19 AM Alfonso ORN wrote: Reason for CRM: pt had questions regarding cat scan results. Pt has read provider message and needs clarification on what was said.

## 2024-09-07 NOTE — Telephone Encounter (Signed)
 Agent will let pt know waiting on dr fry and he is working half day today

## 2024-09-07 NOTE — Telephone Encounter (Signed)
 Attempted to send in. Unable to send- switch to print. Contacted pharmacy and spoke to [place], Teacher, Early Years/pre. Given verbal order.

## 2024-09-07 NOTE — Telephone Encounter (Signed)
 Noted

## 2024-09-11 NOTE — Telephone Encounter (Signed)
 See other message  had appt next week

## 2024-09-13 ENCOUNTER — Encounter: Payer: Self-pay | Admitting: Internal Medicine

## 2024-09-13 ENCOUNTER — Telehealth: Admitting: Internal Medicine

## 2024-09-13 VITALS — Ht 63.0 in | Wt 150.0 lb

## 2024-09-13 DIAGNOSIS — M542 Cervicalgia: Secondary | ICD-10-CM | POA: Diagnosis not present

## 2024-09-13 DIAGNOSIS — I708 Atherosclerosis of other arteries: Secondary | ICD-10-CM | POA: Diagnosis not present

## 2024-09-13 DIAGNOSIS — K110 Atrophy of salivary gland: Secondary | ICD-10-CM | POA: Diagnosis not present

## 2024-09-13 DIAGNOSIS — I2510 Atherosclerotic heart disease of native coronary artery without angina pectoris: Secondary | ICD-10-CM | POA: Diagnosis not present

## 2024-09-13 MED ORDER — EZETIMIBE 10 MG PO TABS: 10.0000 mg | ORAL_TABLET | Freq: Every day | ORAL | 1 refills | Status: DC

## 2024-09-13 MED ORDER — BACLOFEN 10 MG PO TABS: 10.0000 mg | ORAL_TABLET | Freq: Three times a day (TID) | ORAL | 0 refills | Status: AC

## 2024-09-13 NOTE — Progress Notes (Signed)
 " Virtual Visit via Video Note  I connected with Shelley Wright on 09/13/2024 at  8:30 AM EST by a video enabled telemedicine application and verified that I am speaking with the correct person using two identifiers. Location patient: home Location provider:work office Persons participating in the virtual visit: patient, provider   Patient aware  of the limitations of evaluation and management by telemedicine and  availability of in person appointments. and agreed to proceed.   HPI: Shelley Wright presents for video visit follow-up from Dr. Mira evaluation and CT scan report for right-sided neck pain that is recurrent and history of a lump in the neck. The lump went down CT see report. Would like explanation for the CT scan.  She never received he muscle relaxant to try for her neck pain.  Ex-smoker does get winded at times but no acute symptoms.  ROS: See pertinent positives and negatives per HPI.  Past Medical History:  Diagnosis Date   Anxiety    no per pt   Arthritis    Cataract    GERD (gastroesophageal reflux disease)    Heart murmur    'YEARS AGO   History of smoking    History of ulcerative colitis 2005   pancolitis    Hyperlipidemia    Hypertension    Normal nuclear stress test 2008   myoview    Ulcerative colitis (HCC)     Past Surgical History:  Procedure Laterality Date   BREAST BIOPSY Left 1986   biopsy x 2    COLONOSCOPY     KNEE SURGERY Left october   x 2   UPPER GASTROINTESTINAL ENDOSCOPY      Family History  Problem Relation Age of Onset   Pancreatic cancer Father        deceased   Hypertension Father    Breast cancer Mother 56   Angina Mother    Hypertension Mother    Colon cancer Neg Hx    Colon polyps Neg Hx    Rectal cancer Neg Hx    Stomach cancer Neg Hx     Social History[1]   Current Medications[2]  EXAM: BP Readings from Last 3 Encounters:  08/29/24 130/72  05/10/24 126/80  03/30/24 126/67    VITALS per patient if  applicable:  GENERAL: alert, oriented, appears well and in no acute distress  HEENT: atraumatic, conjunttiva clear, no obvious abnormalities on inspection of external nose and ears  NECK: normal movements of the head and neck favors the right side trapezius area no obvious lumps  LUNGS: on inspection no signs of respiratory distress, breathing rate appears normal, no obvious gross SOB, gasping or wheezing  CV: no obvious cyanosis  MS: moves all visible extremities without noticeable abnormality  PSYCH/NEURO: pleasant and cooperative, no obvious depression or anxiety, speech and thought processing grossly intact Lab Results  Component Value Date   WBC 7.6 08/29/2024   HGB 13.1 08/29/2024   HCT 39.9 08/29/2024   PLT 267.0 08/29/2024   GLUCOSE 90 08/29/2024   CHOL 206 (H) 01/15/2024   TRIG 85.0 01/15/2024   HDL 65.90 01/15/2024   LDLDIRECT 138.5 10/17/2013   LDLCALC 123 (H) 01/15/2024   ALT 22 10/22/2023   AST 28 10/22/2023   NA 142 08/29/2024   K 4.1 08/29/2024   CL 106 08/29/2024   CREATININE 0.76 08/29/2024   BUN 18 08/29/2024   CO2 31 08/29/2024   TSH 1.50 10/22/2023   HGBA1C 5.7 10/22/2023  CT report IMPRESSION: 1.  No acute or inflammatory process identified in the Neck. No neck mass or lymphadenopathy. Partial atrophy of both parotid glands suggests previous salivary inflammation. 2. Severe/critical atherosclerotic stenosis of the proximal left subclavian artery, remains patent. 3. Emphysema.  Advanced cervical spine degeneration.   Electronically signed by: Helayne Hurst MD 09/07/2024 08:44 AM EST RP Workstation: [id number] ASSESSMENT AND PLAN:  Discussed the following assessment and plan:    ICD-10-CM   1. Neck pain on right side  M54.2 Ambulatory referral to ENT    2. Salivary gland atrophy  K11.0 Ambulatory referral to ENT   w hx of  swelling  and neck pain    3. Atherosclerosis of subclavian artery critcal on ct scan of neck  I70.8 Ambulatory referral  to Vascular Surgery   ? se of [facility] , refill zetia   on prava    4. Nonocclusive coronary atherosclerosis of native coronary artery  I25.10 ezetimibe  (ZETIA ) 10 MG tablet   V    Muscle relaxant if needed  was sent to priint instead of pharmacy  will resend  Suspect her neck pain per se is related to musculoskeletal condition. Reviewed the vascular findings though are not final would advise vascular referral and eventually optimal isolation of her risk factors. Discussed carotid disease no alarming symptoms but had a mass and some swelling and pain on that side will refer to ENT for evaluation opinion. Refill Zetia  today Counseled.   Expectant management and discussion of plan and treatment with opportunity to ask questions and all were answered. The patient agreed with the plan and demonstrated an understanding of the instructions.  is planning on changing pcp cause of availability and ok with me as indicated  . Advised to call back or seek an in-person evaluation if worsening  or having  further concerns  in interim. Return for as indicated, depending on results of consults .    Apolinar Eastern, MD      [1]  Social History Tobacco Use   Smoking status: Former    Current packs/day: 0.00    Types: Cigarettes    Quit date: 07/04/2014    Years since quitting: 10.2   Smokeless tobacco: Never  Vaping Use   Vaping status: Never Used  Substance Use Topics   Alcohol use: No    Alcohol/week: 0.0 standard drinks of alcohol   Drug use: No  [2]  Current Outpatient Medications:    escitalopram  (LEXAPRO ) 10 MG tablet, Take 1 tablet (10 mg total) by mouth daily., Disp: 90 tablet, Rfl: 3   lisinopril  (ZESTRIL ) 10 MG tablet, Take 2 tablets (20 mg total) by mouth daily., Disp: 180 tablet, Rfl: 1   pravastatin  (PRAVACHOL ) 40 MG tablet, Take 1 tablet (40 mg total) by mouth every evening., Disp: 90 tablet, Rfl: 3   VITAMIN D  PO, Take by mouth daily., Disp: , Rfl:    baclofen  (LIORESAL ) 10 MG  tablet, Take 1 tablet (10 mg total) by mouth 3 (three) times daily. If needed for muscle spasm neck, Disp: 30 each, Rfl: 0   ezetimibe  (ZETIA ) 10 MG tablet, Take 1 tablet (10 mg total) by mouth daily., Disp: 90 tablet, Rfl: 1   TURMERIC PO, Take by mouth daily. (Patient not taking: Reported on 09/13/2024), Disp: , Rfl:   "

## 2024-09-16 ENCOUNTER — Telehealth: Payer: Self-pay | Admitting: Internal Medicine

## 2024-09-16 NOTE — Telephone Encounter (Signed)
 Copied from CRM [id number]. Topic: Referral - Question >> Sep 16, 2024  2:37 PM China J wrote: Reason for CRM: The patient has a referral to see an ENT specialist but appointments are far out. She was able to find a provider by the name of Vaughan Ricker that she is scheduled with for the 7th of January but the provider's clinic is needing her CAT scans sent over.   Clinic Information: Vaughan BIRCH. Ricker, MD [facility] [address] [place], [place] [address] [phone]  Please call the patient back at [phone] for an update.

## 2024-09-19 ENCOUNTER — Encounter: Admitting: Family Medicine

## 2024-09-21 ENCOUNTER — Other Ambulatory Visit: Payer: Self-pay | Admitting: Vascular Surgery

## 2024-09-21 DIAGNOSIS — I771 Stricture of artery: Secondary | ICD-10-CM

## 2024-09-21 DIAGNOSIS — I6529 Occlusion and stenosis of unspecified carotid artery: Secondary | ICD-10-CM

## 2024-09-21 NOTE — Progress Notes (Addendum)
 "  Patient ID: Shelley Wright, female   DOB: 1946-05-05, 79 y.o.   MRN: [id number]  Reason for Consult: New Patient (Initial Visit)   Referred by Shelley Apolinar POUR, MD  Subjective:     HPI Shelley Wright is a 78 y.o. female presenting for evaluation of an incidental finding of an origin left subclavian stenosis on the CT scan.  CT scan was recently obtained due to right sided neck pain.  She denies any left arm or hand pain with exertion.  She has chronic dizziness and lightheadedness but is not instigated or worsened by left arm exertion.  Past Medical History:  Diagnosis Date   Anxiety    no per pt   Arthritis    Cataract    GERD (gastroesophageal reflux disease)    Heart murmur    'YEARS AGO   History of smoking    History of ulcerative colitis 2005   pancolitis    Hyperlipidemia    Hypertension    Normal nuclear stress test 2008   myoview    Ulcerative colitis (HCC)    Family History  Problem Relation Age of Onset   Pancreatic cancer Father        deceased   Hypertension Father    Breast cancer Mother 59   Angina Mother    Hypertension Mother    Colon cancer Neg Hx    Colon polyps Neg Hx    Rectal cancer Neg Hx    Stomach cancer Neg Hx    Past Surgical History:  Procedure Laterality Date   BREAST BIOPSY Left 1986   biopsy x 2    COLONOSCOPY     KNEE SURGERY Left october   x 2   UPPER GASTROINTESTINAL ENDOSCOPY      Short Social History:  Social History   Tobacco Use   Smoking status: Former    Current packs/day: 0.00    Types: Cigarettes    Quit date: 07/04/2014    Years since quitting: 10.2   Smokeless tobacco: Never  Substance Use Topics   Alcohol use: No    Alcohol/week: 0.0 standard drinks of alcohol    Allergies[1]  Current Outpatient Medications  Medication Sig Dispense Refill   baclofen  (LIORESAL ) 10 MG tablet Take 1 tablet (10 mg total) by mouth 3 (three) times daily. If needed for muscle spasm neck 30 each 0   escitalopram  (LEXAPRO )  10 MG tablet Take 1 tablet (10 mg total) by mouth daily. 90 tablet 3   ezetimibe  (ZETIA ) 10 MG tablet Take 1 tablet (10 mg total) by mouth daily. 90 tablet 1   lisinopril  (ZESTRIL ) 10 MG tablet Take 2 tablets (20 mg total) by mouth daily. 180 tablet 1   pravastatin  (PRAVACHOL ) 40 MG tablet Take 1 tablet (40 mg total) by mouth every evening. 90 tablet 3   VITAMIN D  PO Take by mouth daily.     No current facility-administered medications for this visit.    REVIEW OF SYSTEMS  All other systems were reviewed and are negative     Objective:  Objective   Vitals:   09/23/24 0849 09/23/24 0852  BP: (!) 182/89 (!) 187/90  Pulse: 64   Resp: 18   Temp: 99 F (37.2 C)   TempSrc: Temporal   SpO2: 98%   Weight: 157 lb 3.2 oz (71.3 kg)   Height: 5' 3 (1.6 m)    Body mass index is 27.85 kg/m.  Physical Exam General: no acute distress Cardiac: hemodynamically  stable Extremities: Mild ankle edema bilaterally Vascular:   Right: Palpable radial, DP  Left: Palpable radial, DP  Data: CTA with calcific stenosis of the left subclavian origin. Bilateral carotid arteries are widely patent.  MRI from 2024 reviewed No evidence of aortic aneurysm  Carotid duplex Right Carotid Findings:  +----------+--------+--------+--------+------------------+-----------------  -+           PSV cm/sEDV cm/sStenosisPlaque DescriptionComments             +----------+--------+--------+--------+------------------+-----------------  -+  CCA Prox  56      9                                 intimal  thickening  +----------+--------+--------+--------+------------------+-----------------  -+  CCA Distal52      11                                intimal  thickening  +----------+--------+--------+--------+------------------+-----------------  -+  ICA Prox  40      7       1-39%   heterogenous                            +----------+--------+--------+--------+------------------+-----------------  -+  ICA Mid   89      20                                tortuous             +----------+--------+--------+--------+------------------+-----------------  -+  ICA Distal49      13                                tortuous             +----------+--------+--------+--------+------------------+-----------------  -+  ECA      106     14                                                     +----------+--------+--------+--------+------------------+-----------------  -+   +----------+--------+-------+----------------+-------------------+           PSV cm/sEDV cmsDescribe        Arm Pressure (mmHG)  +----------+--------+-------+----------------+-------------------+  [id number]     0      Multiphasic, TWO821                  +----------+--------+-------+----------------+-------------------+   +---------+--------+--+--------+--+---------+  VertebralPSV cm/s54EDV cm/s12Antegrade  +---------+--------+--+--------+--+---------+     Left Carotid Findings:  +----------+--------+--------+--------+------------------+-----------------  -+           PSV cm/sEDV cm/sStenosisPlaque DescriptionComments             +----------+--------+--------+--------+------------------+-----------------  -+  CCA Prox  64      12                                intimal  thickening  +----------+--------+--------+--------+------------------+-----------------  -+  CCA Distal52      6                                                      +----------+--------+--------+--------+------------------+-----------------  -+  ICA Prox  74      23      1-39%   heterogenous                           +----------+--------+--------+--------+------------------+-----------------  -+  ICA Mid   58      14                                tortuous              +----------+--------+--------+--------+------------------+-----------------  -+  ICA Distal91      15                                tortuous             +----------+--------+--------+--------+------------------+-----------------  -+  ECA      78      11              calcific                               +----------+--------+--------+--------+------------------+-----------------  -+   +----------+--------+--------+----------------+-------------------+           PSV cm/sEDV cm/sDescribe        Arm Pressure (mmHG)  +----------+--------+--------+----------------+-------------------+  Subclavian127    0       Multiphasic, TWO815                  +----------+--------+--------+----------------+-------------------+   +---------+--------+--+--------+--+---------+  VertebralPSV cm/s57EDV cm/s12Antegrade  +---------+--------+--+--------+--+---------+       Assessment/Plan:   JANECE LAIDLAW is a 79 y.o. female with an incidental finding of an origin stenosis of the left subclavian artery.  She denies any exertional arm or hand pain or vertebrobasilar symptoms.  I explained that there is no need for vascular intervention for asymptomatic stenosis of the subclavian and does not need surveillance.  Carotid duplex was negative for carotid disease and an MRI of her lumbar spine from 2024 was negative for AAA.  Follow-up as needed   Norman GORMAN Serve MD [facility]     [1] No Known Allergies  "

## 2024-09-23 ENCOUNTER — Encounter: Payer: Self-pay | Admitting: Vascular Surgery

## 2024-09-23 ENCOUNTER — Ambulatory Visit (HOSPITAL_COMMUNITY)
Admission: RE | Admit: 2024-09-23 | Discharge: 2024-09-23 | Disposition: A | Source: Ambulatory Visit | Attending: Surgery | Admitting: Surgery

## 2024-09-23 ENCOUNTER — Other Ambulatory Visit: Payer: Self-pay | Admitting: Internal Medicine

## 2024-09-23 ENCOUNTER — Ambulatory Visit: Payer: Self-pay

## 2024-09-23 ENCOUNTER — Ambulatory Visit: Attending: Vascular Surgery | Admitting: Vascular Surgery

## 2024-09-23 VITALS — BP 187/90 | HR 64 | Temp 99.0°F | Resp 18 | Ht 63.0 in | Wt 157.2 lb

## 2024-09-23 DIAGNOSIS — I700 Atherosclerosis of aorta: Secondary | ICD-10-CM

## 2024-09-23 DIAGNOSIS — I2510 Atherosclerotic heart disease of native coronary artery without angina pectoris: Secondary | ICD-10-CM

## 2024-09-23 DIAGNOSIS — I771 Stricture of artery: Secondary | ICD-10-CM | POA: Diagnosis present

## 2024-09-23 NOTE — Telephone Encounter (Signed)
 Copied from CRM [id number]. Topic: Clinical - Medication Question >> Sep 23, 2024 12:37 PM Jayma L wrote: Reason for CRM: patient called in stated she wanted to take a bigger dose of lisinopril  (ZESTRIL ) 10 MG tablet , asking if she can take 2 instead of 1. Please call her back at [phone]

## 2024-09-23 NOTE — Telephone Encounter (Signed)
 FYI Only or Action Required?: Action required by provider: update on patient condition.  Patient was last seen in primary care on 09/13/2024 by Panosh, Apolinar POUR, MD.  Called Nurse Triage reporting Hypertension.  Symptoms began several days ago.  Interventions attempted: Prescription medications: lisinopril .  Symptoms are: unchanged .  Triage Disposition: See Physician Within 24 Hours  Patient/caregiver understands and will follow disposition?: No, wishes to speak with PCP  Copied from CRM [id number]. Topic: Clinical - Medication Question >> Sep 23, 2024 12:37 PM Jayma L wrote: Reason for CRM: patient called in stated she wanted to take a bigger dose of lisinopril  (ZESTRIL ) 10 MG tablet , asking if she can take 2 instead of 1. Please call her back at [phone]   Reason for Disposition  Systolic BP >= 180 OR Diastolic >= 110  Answer Assessment - Initial Assessment Questions Hx of hypertension-  Had epidural Tues and 3 cortisone injections Thurs-  her blood pressure has been elevated for the last 3 days. 183/80's (doesn't remember the diastolic clearly) and 187/80's.   Denies headache, vision changes, or balance issues. Denies CP, fever, or dizziness. Does have mild SOB that they have been working up with Cards and Vascular- CT scan showed beginning stages of Emphysema  Patient has had this issue in the past and was advised previously to take 20mg  Daily for BP sustaining over 140/90 after injections. And advised to reach out to the office.  Patient to start 20mg  and to please reach out if any other recommendations.   -- also asked to set up a TOC appt to [facility] due to provider having limited availability and possibly retiring soon.---  1. BLOOD PRESSURE: What is your blood pressure? Did you take at least two measurements 5 minutes apart?     183/80's, 187/80's 2. ONSET: When did you take your blood pressure?     3 days  3. HOW: How did you take your blood pressure? (e.g.,  automatic home BP monitor, visiting nurse)     Automatic arm cuff 4. HISTORY: Do you have a history of high blood pressure?     HTN  5. MEDICINES: Are you taking any medicines for blood pressure? Have you missed any doses recently?     No missed doses of  6. OTHER SYMPTOMS: Do you have any symptoms? (e.g., blurred vision, chest pain, difficulty breathing, headache, weakness)     SOB and following with Cards and Vasc  Protocols used: Blood Pressure - High-A-AH

## 2024-09-23 NOTE — Telephone Encounter (Unsigned)
 Copied from CRM [id number]. Topic: Clinical - Medication Refill >> Sep 23, 2024 12:34 PM Jayma L wrote: Medication: ezetimibe  (ZETIA ) 10 MG tablet pravastatin  (PRAVACHOL ) 40 MG tablet escitalopram  (LEXAPRO ) 10 MG tablet lisinopril  (ZESTRIL ) 10 MG tablet   Has the patient contacted their pharmacy? Yes (Agent: If no, request that the patient contact the pharmacy for the refill. If patient does not wish to contact the pharmacy document the reason why and proceed with request.) (Agent: If yes, when and what did the pharmacy advise?)  This is the patient's preferred pharmacy:  [facility] - [place], [place] - [address] [place] [address] Phone: [phone] Fax: [phone]  Is this the correct pharmacy for this prescription? Yes If no, delete pharmacy and type the correct one.   Has the prescription been filled recently? No  Is the patient out of the medication? No  Has the patient been seen for an appointment in the last year OR does the patient have an upcoming appointment? Yes  Can we respond through MyChart? No  Agent: Please be advised that Rx refills may take up to 3 business days. We ask that you follow-up with your pharmacy.

## 2024-09-26 MED ORDER — EZETIMIBE 10 MG PO TABS: 10.0000 mg | ORAL_TABLET | Freq: Every day | ORAL | 1 refills | Status: AC

## 2024-09-26 MED ORDER — ESCITALOPRAM OXALATE 10 MG PO TABS: 10.0000 mg | ORAL_TABLET | Freq: Every day | ORAL | 1 refills | Status: AC

## 2024-09-26 MED ORDER — PRAVASTATIN SODIUM 40 MG PO TABS: 40.0000 mg | ORAL_TABLET | Freq: Every evening | ORAL | 1 refills | Status: AC

## 2024-09-26 MED ORDER — LISINOPRIL 10 MG PO TABS: 20.0000 mg | ORAL_TABLET | Freq: Every day | ORAL | 1 refills | Status: AC

## 2024-10-11 ENCOUNTER — Institutional Professional Consult (permissible substitution) (INDEPENDENT_AMBULATORY_CARE_PROVIDER_SITE_OTHER): Admitting: Otolaryngology

## 2024-10-14 ENCOUNTER — Encounter: Admitting: Vascular Surgery

## 2024-10-14 ENCOUNTER — Ambulatory Visit (HOSPITAL_COMMUNITY)

## 2024-11-01 ENCOUNTER — Encounter: Admitting: Family Medicine

## 2025-01-25 ENCOUNTER — Ambulatory Visit
